# Patient Record
Sex: Female | Born: 1945
Health system: Southern US, Community
[De-identification: ages and names within clinical notes are randomized; demographics above are authoritative.]

## PROBLEM LIST (undated history)

## (undated) DIAGNOSIS — I10 Essential (primary) hypertension: Secondary | ICD-10-CM

## (undated) DIAGNOSIS — D649 Anemia, unspecified: Secondary | ICD-10-CM

## (undated) DIAGNOSIS — T7840XA Allergy, unspecified, initial encounter: Secondary | ICD-10-CM

## (undated) DIAGNOSIS — E785 Hyperlipidemia, unspecified: Secondary | ICD-10-CM

## (undated) DIAGNOSIS — E119 Type 2 diabetes mellitus without complications: Secondary | ICD-10-CM

## (undated) DIAGNOSIS — E039 Hypothyroidism, unspecified: Secondary | ICD-10-CM

## (undated) DIAGNOSIS — M549 Dorsalgia, unspecified: Secondary | ICD-10-CM

## (undated) DIAGNOSIS — M1990 Unspecified osteoarthritis, unspecified site: Secondary | ICD-10-CM

## (undated) DIAGNOSIS — K219 Gastro-esophageal reflux disease without esophagitis: Secondary | ICD-10-CM

## (undated) DIAGNOSIS — F329 Major depressive disorder, single episode, unspecified: Secondary | ICD-10-CM

## (undated) DIAGNOSIS — F32A Depression, unspecified: Secondary | ICD-10-CM

## (undated) DIAGNOSIS — E079 Disorder of thyroid, unspecified: Secondary | ICD-10-CM

## (undated) HISTORY — DX: Depression, unspecified: F32.A

## (undated) HISTORY — DX: Type 2 diabetes mellitus without complications: E11.9

## (undated) HISTORY — DX: Hyperlipidemia, unspecified: E78.5

## (undated) HISTORY — DX: Allergy, unspecified, initial encounter: T78.40XA

## (undated) HISTORY — DX: Essential (primary) hypertension: I10

## (undated) HISTORY — DX: Dorsalgia, unspecified: M54.9

## (undated) HISTORY — PX: BACK SURGERY: SHX140

## (undated) HISTORY — PX: REPLACEMENT TOTAL KNEE: SUR1224

## (undated) HISTORY — DX: Disorder of thyroid, unspecified: E07.9

---
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
## 1977-08-17 HISTORY — PX: CHOLECYSTECTOMY: SHX55

## 2002-08-17 HISTORY — PX: SPINE SURGERY: SHX786

## 2015-08-18 HISTORY — PX: JOINT REPLACEMENT: SHX530

## 2018-05-20 LAB — BASIC METABOLIC PANEL
BUN: 19 (ref 4–21)
Creatinine: 0.7 (ref 0.5–1.1)
Potassium: 5.2 (ref 3.4–5.3)
Sodium: 141 (ref 137–147)

## 2018-05-20 LAB — LIPID PANEL
Cholesterol: 124 (ref 0–200)
HDL: 35 (ref 35–70)
LDL Cholesterol: 64
Triglycerides: 176 — AB (ref 40–160)

## 2018-05-20 LAB — HEPATIC FUNCTION PANEL
ALT: 16 (ref 7–35)
AST: 16 (ref 13–35)

## 2018-05-20 LAB — CBC AND DIFFERENTIAL
HCT: 42 (ref 36–46)
Hemoglobin: 13.2 (ref 12.0–16.0)
Platelets: 280 (ref 150–399)

## 2018-08-18 LAB — MICROALBUMIN, URINE: Microalb, Ur: 70

## 2018-08-18 LAB — HEMOGLOBIN A1C: Hemoglobin A1C: 7.5

## 2019-03-18 LAB — COLOGUARD: Cologuard: NEGATIVE

## 2019-06-02 ENCOUNTER — Other Ambulatory Visit: Payer: Self-pay

## 2019-06-02 DIAGNOSIS — Z20822 Contact with and (suspected) exposure to covid-19: Secondary | ICD-10-CM

## 2019-06-04 LAB — NOVEL CORONAVIRUS, NAA: SARS-CoV-2, NAA: NOT DETECTED

## 2019-06-10 ENCOUNTER — Telehealth: Payer: Self-pay

## 2019-06-10 NOTE — Telephone Encounter (Signed)
Pt called to update SSN in chart for MyChart enrollment.

## 2019-06-27 ENCOUNTER — Ambulatory Visit (INDEPENDENT_AMBULATORY_CARE_PROVIDER_SITE_OTHER): Payer: Medicare PPO | Admitting: Family Medicine

## 2019-06-27 ENCOUNTER — Encounter: Payer: Self-pay | Admitting: Family Medicine

## 2019-06-27 ENCOUNTER — Other Ambulatory Visit: Payer: Self-pay

## 2019-06-27 VITALS — BP 120/72 | HR 80 | Ht 59.0 in | Wt 186.0 lb

## 2019-06-27 DIAGNOSIS — Z23 Encounter for immunization: Secondary | ICD-10-CM

## 2019-06-27 DIAGNOSIS — M545 Low back pain, unspecified: Secondary | ICD-10-CM

## 2019-06-27 DIAGNOSIS — E78019 Familial hypercholesterolemia, unspecified: Secondary | ICD-10-CM

## 2019-06-27 DIAGNOSIS — E1140 Type 2 diabetes mellitus with diabetic neuropathy, unspecified: Secondary | ICD-10-CM | POA: Diagnosis not present

## 2019-06-27 DIAGNOSIS — G8929 Other chronic pain: Secondary | ICD-10-CM

## 2019-06-27 DIAGNOSIS — I10 Essential (primary) hypertension: Secondary | ICD-10-CM

## 2019-06-27 DIAGNOSIS — E7801 Familial hypercholesterolemia: Secondary | ICD-10-CM | POA: Diagnosis not present

## 2019-06-27 DIAGNOSIS — L309 Dermatitis, unspecified: Secondary | ICD-10-CM

## 2019-06-27 DIAGNOSIS — E034 Atrophy of thyroid (acquired): Secondary | ICD-10-CM

## 2019-06-27 DIAGNOSIS — Z7689 Persons encountering health services in other specified circumstances: Secondary | ICD-10-CM

## 2019-06-27 MED ORDER — LEVOTHYROXINE SODIUM 88 MCG PO TABS: 88.0000 ug | ORAL_TABLET | Freq: Every day | ORAL | 1 refills | Status: DC

## 2019-06-27 MED ORDER — TRIAMCINOLONE ACETONIDE 0.1 % EX CREA: 1.0000 "application " | TOPICAL_CREAM | Freq: Two times a day (BID) | CUTANEOUS | 0 refills | Status: DC

## 2019-06-27 NOTE — Progress Notes (Signed)
Date:  06/27/2019   Name:  Sara Carr   DOB:  24-Sep-1945   MRN:  [id number]   Chief Complaint: Establish Care, ref neuro (back pain), Rash (hands itching), Flu Vaccine, and pneu vacc need (13)  Patient is a 73 year old female who presents for a establishment of care exam. The patient reports the following problems: multiple medical concern. Health maintenance has been reviewed up to date.  Rash This is a chronic problem. The current episode started more than 1 year ago. The problem has been waxing and waning since onset. The affected locations include the left hand and right hand. The rash is characterized by redness and itchiness. Pertinent negatives include no anorexia, congestion, cough, diarrhea, eye pain, facial edema, fatigue, fever, joint pain, nail changes, rhinorrhea, shortness of breath, sore throat or vomiting.  Diabetes She has type 2 diabetes mellitus. Her disease course has been fluctuating. There are no hypoglycemic associated symptoms. Pertinent negatives for hypoglycemia include no dizziness, headaches, nervousness/anxiousness or sweats. Pertinent negatives for diabetes include no blurred vision, no chest pain, no fatigue, no foot paresthesias, no foot ulcerations, no polydipsia, no polyphagia, no polyuria, no visual change, no weakness and no weight loss. There are no hypoglycemic complications. Symptoms are worsening. There are no diabetic complications. Pertinent negatives for diabetic complications include no CVA, peripheral neuropathy, PVD or retinopathy. Current diabetic treatment includes oral agent (dual therapy) (ozempic).  Hypertension This is a chronic problem. The current episode started more than 1 year ago. The problem is controlled. Pertinent negatives include no anxiety, blurred vision, chest pain, headaches, malaise/fatigue, neck pain, orthopnea, palpitations, peripheral edema, PND, shortness of breath or sweats. Risk factors for coronary artery disease include  diabetes mellitus and dyslipidemia. The current treatment provides moderate improvement. There are no compliance problems.  There is no history of angina, kidney disease, CAD/MI, CVA, heart failure, left ventricular hypertrophy, PVD or retinopathy. Identifiable causes of hypertension include a thyroid problem. There is no history of chronic renal disease, a hypertension causing med or renovascular disease.  Hyperlipidemia This is a chronic problem. The current episode started more than 1 year ago. The problem is controlled. Recent lipid tests were reviewed and are normal. She has no history of chronic renal disease, diabetes, hypothyroidism, liver disease or obesity. Pertinent negatives include no chest pain, focal sensory loss, focal weakness, leg pain, myalgias or shortness of breath. Current antihyperlipidemic treatment includes statins. The current treatment provides moderate improvement of lipids. Risk factors for coronary artery disease include diabetes mellitus and hypertension.  Thyroid Problem Presents for follow-up visit. Patient reports no anxiety, constipation, depressed mood, diarrhea, fatigue, palpitations, visual change or weight loss. The symptoms have been stable. Her past medical history is significant for hyperlipidemia. There is no history of diabetes or heart failure.    Lab Results  Component Value Date   CREATININE 0.7 05/20/2018   BUN 19 05/20/2018   NA 141 05/20/2018   K 5.2 05/20/2018   Lab Results  Component Value Date   CHOL 124 05/20/2018   HDL 35 05/20/2018   LDLCALC 64 05/20/2018   TRIG 176 (A) 05/20/2018   Lab Results  Component Value Date   TSH 0.03 (A) 08/20/2018   Lab Results  Component Value Date   HGBA1C 7.5 08/18/2018     Review of Systems  Constitutional: Negative for chills, fatigue, fever, malaise/fatigue and weight loss.  HENT: Negative for congestion, drooling, ear discharge, ear pain, rhinorrhea and sore throat.   Eyes: Negative  for  blurred vision and pain.  Respiratory: Negative for cough, shortness of breath and wheezing.   Cardiovascular: Negative for chest pain, palpitations, orthopnea, leg swelling and PND.  Gastrointestinal: Negative for abdominal pain, anorexia, blood in stool, constipation, diarrhea, nausea and vomiting.  Endocrine: Negative for polydipsia, polyphagia and polyuria.  Genitourinary: Negative for dysuria, frequency, hematuria and urgency.  Musculoskeletal: Negative for back pain, joint pain, myalgias and neck pain.  Skin: Positive for rash. Negative for nail changes.  Allergic/Immunologic: Negative for environmental allergies.  Neurological: Negative for dizziness, focal weakness, weakness and headaches.  Hematological: Does not bruise/bleed easily.  Psychiatric/Behavioral: Negative for suicidal ideas. The patient is not nervous/anxious.     There are no active problems to display for this patient.   Allergies  Allergen Reactions  . Penicillins Hives    Past Surgical History:  Procedure Laterality Date  . BACK SURGERY    . REPLACEMENT TOTAL KNEE Bilateral     Social History   Tobacco Use  . Smoking status: Never Smoker  . Smokeless tobacco: Never Used  Substance Use Topics  . Alcohol use: Never    Frequency: Never  . Drug use: Never     Medication list has been reviewed and updated.  Current Meds  Medication Sig  . atorvastatin (LIPITOR) 40 MG tablet Take 40 mg by mouth daily.  . carvedilol (COREG) 6.25 MG tablet Take 6.25 mg by mouth daily.  . Dapagliflozin-metFORMIN HCl ER (XIGDUO XR) 05-999 MG TB24 Take 1 tablet by mouth daily.  . diclofenac (VOLTAREN) 50 MG EC tablet Take 50 mg by mouth daily.  Marland Kitchen gabapentin (NEURONTIN) 800 MG tablet Take 800 mg by mouth 2 (two) times daily. As needed/ neuro  . lansoprazole (PREVACID) 30 MG capsule Take 30 mg by mouth daily at 12 noon.  Marland Kitchen levothyroxine (SYNTHROID) 100 MCG tablet Take 100 mcg by mouth daily before breakfast.  . losartan  (COZAAR) 25 MG tablet Take 25 mg by mouth daily.  . Semaglutide (OZEMPIC, 0.25 OR 0.5 MG/DOSE, [place]) Inject into the skin once a week. On Fridays  . sertraline (ZOLOFT) 50 MG tablet Take 50 mg by mouth daily.  Marland Kitchen tolterodine (DETROL LA) 4 MG 24 hr capsule Take 4 mg by mouth daily.    PHQ 2/9 Scores 06/27/2019  PHQ - 2 Score 4  PHQ- 9 Score 9    BP Readings from Last 3 Encounters:  06/27/19 120/72    Physical Exam Vitals signs and nursing note reviewed.  Constitutional:      Appearance: She is well-developed.  HENT:     Head: Normocephalic.     Right Ear: Tympanic membrane and external ear normal.     Left Ear: Tympanic membrane and external ear normal.     Nose: Nose normal. No congestion or rhinorrhea.  Eyes:     General: Lids are everted, no foreign bodies appreciated. No scleral icterus.       Left eye: No foreign body or hordeolum.     Conjunctiva/sclera: Conjunctivae normal.     Right eye: Right conjunctiva is not injected.     Left eye: Left conjunctiva is not injected.     Pupils: Pupils are equal, round, and reactive to light.  Neck:     Musculoskeletal: Normal range of motion and neck supple.     Thyroid: No thyromegaly.     Vascular: No carotid bruit or JVD.     Trachea: No tracheal deviation.  Cardiovascular:     Rate and  Rhythm: Normal rate and regular rhythm.     Heart sounds: Normal heart sounds. No murmur. No friction rub. No gallop.   Pulmonary:     Effort: Pulmonary effort is normal. No respiratory distress.     Breath sounds: Normal breath sounds. No wheezing, rhonchi or rales.  Abdominal:     General: Bowel sounds are normal.     Palpations: Abdomen is soft. There is no mass.     Tenderness: There is no abdominal tenderness. There is no guarding or rebound.  Musculoskeletal: Normal range of motion.        General: No tenderness.  Lymphadenopathy:     Cervical: No cervical adenopathy.  Skin:    General: Skin is warm.     Findings: No rash.   Neurological:     Mental Status: She is alert and oriented to person, place, and time.     Cranial Nerves: No cranial nerve deficit.     Deep Tendon Reflexes: Reflexes normal.  Psychiatric:        Mood and Affect: Mood is not anxious or depressed.     Wt Readings from Last 3 Encounters:  06/27/19 186 lb (84.4 kg)    BP 120/72   Pulse 80   Ht 4\' 11"  (1.499 m)   Wt 186 lb (84.4 kg)   BMI 37.57 kg/m   Assessment and Plan:  1. Establishing care with new doctor, encounter for Patient to establish care with new physician.  Previous encounters, labs, as available were reviewed.  New medications were added and reviewed.  2. Type 2 diabetes mellitus with diabetic neuropathy, without long-term current use of insulin (HCC) Chronic.  Controlled.  Patient is on a combination of Ozempic weekly and Xigduo XR 05-999 every 24 hours.  Will refer to endocrinology for management. - Ambulatory referral to Endocrinology  3. Essential hypertension Chronic.  Controlled.  Continue Coreg 6.25, Cozaar 25 mg.  4. Familial hypercholesterolemia Chronic.  Controlled.  Continue atorvastatin 40 mg once a day.  5. Hypothyroidism due to acquired atrophy of thyroid Upon review of previous labs it was noted that TSH was in a very low range the past 2 times.  We will go ahead and adjust Synthroid to 88 mcg.  And refer to endocrinology for further evaluation and treatment. - Ambulatory referral to Endocrinology  6. Chronic low back pain without sciatica, unspecified back pain laterality Patient with history of chronic back pain.  Patient presently is on gabapentin 800 mg up to twice a day.  7. Influenza vaccine needed Discussed and administered - Flu Vaccine QUAD High Dose(Fluad)  8. Need for pneumococcal vaccination Discussed and administered - Pneumococcal conjugate vaccine 13-valent  9. Eczema of both hands New onset both hands itching with erythema.  Suggested taking an antihistamine like Claritin  or Zyrtec during the day and a Benadryl at night.  Patient was also called in triamcinolone cream 0.1% to apply as needed - triamcinolone cream (KENALOG) 0.1 %; Apply 1 application topically 2 (two) times daily.  Dispense: 30 g; Refill: 0

## 2019-08-29 ENCOUNTER — Ambulatory Visit: Payer: Medicare Other | Attending: Internal Medicine

## 2019-08-29 DIAGNOSIS — Z23 Encounter for immunization: Secondary | ICD-10-CM

## 2019-08-29 NOTE — Progress Notes (Signed)
   Covid-19 Vaccination Clinic  Name:  Sara Carr    MRN: [id number] DOB: [birth year]  08/29/2019  [name] was observed post Covid-19 immunization for 15 minutes without incidence. She was provided with Vaccine Information Sheet and instruction to access the V-Safe system.   [name] was instructed to call 911 with any severe reactions post vaccine: Marland Kitchen Difficulty breathing  . Swelling of your face and throat  . A fast heartbeat  . A bad rash all over your body  . Dizziness and weakness    Immunizations Administered    Name Date Dose VIS Date Route   Pfizer COVID-19 Vaccine 08/29/2019 11:25 AM 0.3 mL 07/28/2019 Intramuscular   Manufacturer: [employer], [employer]   Lot: [id number]   NDC: [address]-7

## 2019-09-11 ENCOUNTER — Other Ambulatory Visit: Payer: Self-pay

## 2019-09-11 MED ORDER — LOSARTAN POTASSIUM 25 MG PO TABS: 25.0000 mg | ORAL_TABLET | Freq: Every day | ORAL | 0 refills | Status: DC

## 2019-09-11 MED ORDER — LEVOTHYROXINE SODIUM 88 MCG PO TABS: 88.0000 ug | ORAL_TABLET | Freq: Every day | ORAL | 0 refills | Status: DC

## 2019-09-11 MED ORDER — TOLTERODINE TARTRATE ER 4 MG PO CP24: 4.0000 mg | ORAL_CAPSULE | Freq: Every day | ORAL | 0 refills | Status: DC

## 2019-09-11 MED ORDER — ATORVASTATIN CALCIUM 40 MG PO TABS: 40.0000 mg | ORAL_TABLET | Freq: Every day | ORAL | 0 refills | Status: DC

## 2019-09-11 MED ORDER — LANSOPRAZOLE 30 MG PO CPDR: 30.0000 mg | DELAYED_RELEASE_CAPSULE | Freq: Every day | ORAL | 0 refills | Status: DC

## 2019-09-11 MED ORDER — SERTRALINE HCL 50 MG PO TABS: 50.0000 mg | ORAL_TABLET | Freq: Every day | ORAL | 0 refills | Status: DC

## 2019-09-11 MED ORDER — DICLOFENAC SODIUM 50 MG PO TBEC: 50.0000 mg | DELAYED_RELEASE_TABLET | Freq: Every day | ORAL | 0 refills | Status: DC

## 2019-09-16 ENCOUNTER — Ambulatory Visit: Payer: Medicare PPO

## 2019-09-16 ENCOUNTER — Ambulatory Visit: Payer: Medicare PPO | Attending: Internal Medicine

## 2019-09-16 DIAGNOSIS — Z23 Encounter for immunization: Secondary | ICD-10-CM | POA: Insufficient documentation

## 2019-09-16 NOTE — Progress Notes (Signed)
   Covid-19 Vaccination Clinic  Name:  Sara Carr    MRN: [id number] DOB: Aug 22, 1945  09/16/2019  [name] was observed post Covid-19 immunization for 15 minutes without incidence. She was provided with Vaccine Information Sheet and instruction to access the V-Safe system.   [name] was instructed to call 911 with any severe reactions post vaccine: Marland Kitchen Difficulty breathing  . Swelling of your face and throat  . A fast heartbeat  . A bad rash all over your body  . Dizziness and weakness    Immunizations Administered    Name Date Dose VIS Date Route   Pfizer COVID-19 Vaccine 09/16/2019  2:01 PM 0.3 mL 07/28/2019 Intramuscular   Manufacturer: [employer], [employer]   Lot: [id number]   NDC: [address]-7

## 2019-10-26 ENCOUNTER — Telehealth: Payer: Self-pay

## 2019-10-26 NOTE — Telephone Encounter (Signed)
Pt called in stating that something needed to be done about her medicine. It is "400.00 and I can't afford that." I called pt back and explained to her that Otelia Sergeant had seen her for her diab as well as her thyroid in Dec. I gave her the telephone number to that office and told her to tell them she is having a hard time with ins paying for this med. To see if they prescribe it, will insurance pay. We don't know of anything else that can be prescribed, as this should be a generic

## 2019-11-03 DIAGNOSIS — E063 Autoimmune thyroiditis: Secondary | ICD-10-CM | POA: Diagnosis not present

## 2019-11-03 DIAGNOSIS — E782 Mixed hyperlipidemia: Secondary | ICD-10-CM | POA: Diagnosis not present

## 2019-11-03 DIAGNOSIS — E669 Obesity, unspecified: Secondary | ICD-10-CM | POA: Diagnosis not present

## 2019-11-03 DIAGNOSIS — E1169 Type 2 diabetes mellitus with other specified complication: Secondary | ICD-10-CM | POA: Diagnosis not present

## 2019-11-03 DIAGNOSIS — I10 Essential (primary) hypertension: Secondary | ICD-10-CM | POA: Diagnosis not present

## 2019-11-03 LAB — HEMOGLOBIN A1C: Hemoglobin A1C: 7.8

## 2019-11-06 ENCOUNTER — Other Ambulatory Visit: Payer: Self-pay

## 2019-11-06 NOTE — Progress Notes (Unsigned)
Put in A1C 

## 2019-11-07 DIAGNOSIS — E1142 Type 2 diabetes mellitus with diabetic polyneuropathy: Secondary | ICD-10-CM | POA: Diagnosis not present

## 2019-11-07 DIAGNOSIS — B351 Tinea unguium: Secondary | ICD-10-CM | POA: Diagnosis not present

## 2019-11-14 ENCOUNTER — Other Ambulatory Visit: Payer: Self-pay

## 2019-11-14 DIAGNOSIS — E034 Atrophy of thyroid (acquired): Secondary | ICD-10-CM

## 2019-11-14 MED ORDER — LEVOTHYROXINE SODIUM 75 MCG PO TABS: 75.0000 ug | ORAL_TABLET | Freq: Every day | ORAL | 0 refills | Status: DC

## 2019-11-21 ENCOUNTER — Other Ambulatory Visit: Payer: Self-pay

## 2019-11-21 MED ORDER — TOLTERODINE TARTRATE ER 4 MG PO CP24: 4.0000 mg | ORAL_CAPSULE | Freq: Every day | ORAL | 0 refills | Status: DC

## 2019-11-21 NOTE — Progress Notes (Unsigned)
Sent in tolterodine to [facility]

## 2019-11-23 ENCOUNTER — Other Ambulatory Visit: Payer: Self-pay

## 2019-11-23 ENCOUNTER — Encounter: Payer: Self-pay | Admitting: Family Medicine

## 2019-11-23 ENCOUNTER — Ambulatory Visit: Payer: Medicare PPO | Admitting: Family Medicine

## 2019-11-23 VITALS — BP 120/54 | HR 66 | Temp 98.5°F | Ht 59.0 in | Wt 192.0 lb

## 2019-11-23 DIAGNOSIS — R350 Frequency of micturition: Secondary | ICD-10-CM

## 2019-11-23 DIAGNOSIS — R358 Other polyuria: Secondary | ICD-10-CM | POA: Diagnosis not present

## 2019-11-23 DIAGNOSIS — E1140 Type 2 diabetes mellitus with diabetic neuropathy, unspecified: Secondary | ICD-10-CM | POA: Diagnosis not present

## 2019-11-23 LAB — POCT URINALYSIS DIPSTICK
Bilirubin, UA: NEGATIVE
Blood, UA: NEGATIVE
Glucose, UA: POSITIVE — AB
Ketones, UA: NEGATIVE
Leukocytes, UA: NEGATIVE
Nitrite, UA: NEGATIVE
Protein, UA: NEGATIVE
Spec Grav, UA: 1.02 (ref 1.010–1.025)
Urobilinogen, UA: 0.2 E.U./dL
pH, UA: 6 (ref 5.0–8.0)

## 2019-11-23 NOTE — Progress Notes (Signed)
Date:  11/23/2019   Name:  Sara Carr   DOB:  [birth year]   MRN:  [id number]   Chief Complaint: Urinary Tract Infection (x1 week,loss of control urine, burning, itching, and pressure)  Urinary Frequency  This is a chronic problem. The current episode started more than 1 year ago. The problem has been gradually worsening. The patient is experiencing no pain. There has been no fever. Associated symptoms include frequency. Pertinent negatives include no chills, discharge, flank pain, hematuria, hesitancy, nausea, sweats, urgency or vomiting. She has tried nothing for the symptoms. The treatment provided mild relief. There is no history of catheterization, kidney stones, recurrent UTIs, a single kidney, urinary stasis or a urological procedure.    Lab Results  Component Value Date   CREATININE 0.7 05/20/2018   BUN 19 05/20/2018   NA 141 05/20/2018   K 5.2 05/20/2018   Lab Results  Component Value Date   CHOL 124 05/20/2018   HDL 35 05/20/2018   LDLCALC 64 05/20/2018   TRIG 176 (A) 05/20/2018   No results found for: TSH Lab Results  Component Value Date   HGBA1C 7.8 11/03/2019   Lab Results  Component Value Date   HGB 13.2 05/20/2018   HCT 42 05/20/2018   PLT 280 05/20/2018   Lab Results  Component Value Date   ALT 16 05/20/2018   AST 16 05/20/2018     Review of Systems  Constitutional: Negative.  Negative for chills, fatigue, fever and unexpected weight change.  HENT: Negative for congestion, ear discharge, ear pain, rhinorrhea, sinus pressure, sneezing and sore throat.   Eyes: Negative for photophobia, pain, discharge, redness and itching.  Respiratory: Negative for cough, shortness of breath, wheezing and stridor.   Cardiovascular: Negative for palpitations and leg swelling.  Gastrointestinal: Negative for abdominal pain, blood in stool, constipation, diarrhea, nausea and vomiting.  Endocrine: Negative for cold intolerance, heat intolerance, polydipsia, polyphagia  and polyuria.  Genitourinary: Positive for frequency. Negative for dysuria, flank pain, hematuria, hesitancy, menstrual problem, pelvic pain, urgency, vaginal bleeding and vaginal discharge.  Musculoskeletal: Negative for arthralgias, back pain and myalgias.  Skin: Negative for rash.  Allergic/Immunologic: Negative for environmental allergies and food allergies.  Neurological: Negative for dizziness, weakness, light-headedness, numbness and headaches.  Hematological: Negative for adenopathy. Does not bruise/bleed easily.  Psychiatric/Behavioral: Negative for dysphoric mood. The patient is not nervous/anxious.     There are no problems to display for this patient.   Allergies  Allergen Reactions  . Penicillins Hives    Past Surgical History:  Procedure Laterality Date  . BACK SURGERY    . REPLACEMENT TOTAL KNEE Bilateral     Social History   Tobacco Use  . Smoking status: Never Smoker  . Smokeless tobacco: Never Used  Substance Use Topics  . Alcohol use: Never  . Drug use: Never     Medication list has been reviewed and updated.  Current Meds  Medication Sig  . atorvastatin (LIPITOR) 40 MG tablet Take 1 tablet (40 mg total) by mouth daily.  . carvedilol (COREG) 6.25 MG tablet Take 6.25 mg by mouth daily.  . diclofenac (VOLTAREN) 50 MG EC tablet Take 1 tablet (50 mg total) by mouth daily.  Marland Kitchen gabapentin (NEURONTIN) 800 MG tablet Take 800 mg by mouth 2 (two) times daily. As needed/ neuro  . lansoprazole (PREVACID) 30 MG capsule Take 1 capsule (30 mg total) by mouth daily at 12 noon.  Marland Kitchen levothyroxine (SYNTHROID) 75 MCG tablet Take 1 tablet (  75 mcg total) by mouth daily.  Marland Kitchen losartan (COZAAR) 25 MG tablet Take 1 tablet (25 mg total) by mouth daily.  . Semaglutide (OZEMPIC, 0.25 OR 0.5 MG/DOSE, [place]) Inject into the skin once a week. On Fridays  . sertraline (ZOLOFT) 50 MG tablet Take 1 tablet (50 mg total) by mouth daily.  Marland Kitchen tolterodine (DETROL LA) 4 MG 24 hr capsule Take 1  capsule (4 mg total) by mouth daily.  Marland Kitchen triamcinolone cream (KENALOG) 0.1 % Apply 1 application topically 2 (two) times daily.    PHQ 2/9 Scores 11/23/2019 06/27/2019  PHQ - 2 Score 6 4  PHQ- 9 Score 9 9    BP Readings from Last 3 Encounters:  11/23/19 (!) 120/54  06/27/19 120/72    Physical Exam Vitals and nursing note reviewed.  Constitutional:      General: She is not in acute distress.    Appearance: She is not diaphoretic.  HENT:     Head: Normocephalic and atraumatic.     Right Ear: External ear normal.     Left Ear: External ear normal.     Nose: Nose normal.  Eyes:     General:        Right eye: No discharge.        Left eye: No discharge.     Conjunctiva/sclera: Conjunctivae normal.     Pupils: Pupils are equal, round, and reactive to light.  Neck:     Thyroid: No thyromegaly.     Vascular: No JVD.  Cardiovascular:     Rate and Rhythm: Normal rate and regular rhythm.     Heart sounds: Normal heart sounds. No murmur. No friction rub. No gallop.   Pulmonary:     Effort: Pulmonary effort is normal.     Breath sounds: Normal breath sounds.  Abdominal:     General: Bowel sounds are normal.     Palpations: Abdomen is soft. There is no mass.     Tenderness: There is no abdominal tenderness. There is no guarding.  Musculoskeletal:        General: Normal range of motion.     Cervical back: Normal range of motion and neck supple.  Lymphadenopathy:     Cervical: No cervical adenopathy.  Skin:    General: Skin is warm and dry.  Neurological:     Mental Status: She is alert.     Deep Tendon Reflexes: Reflexes are normal and symmetric.     Wt Readings from Last 3 Encounters:  11/23/19 192 lb (87.1 kg)  06/27/19 186 lb (84.4 kg)    BP (!) 120/54   Pulse 66   Temp 98.5 F (36.9 C) (Oral)   Ht 4\' 11"  (1.499 m)   Wt 192 lb (87.1 kg)   BMI 38.78 kg/m   Assessment and Plan: 1. Urinary frequency Acute.  Paroxysmal.  Excessive and uncontrolled.  Patient is  having urinary incontinence due to excessive amounts of urine.  I do not think is the medication because upon checking her point-of-care blood sugar it was over 400.  I suspect is due to the polyuria. - POCT Urinalysis Dipstick  2. Frequency of urination and polyuria Chronic.  Uncontrolled.  Stable.  Patient has elevated blood glucose secondary intrinsic rather than overeating or medications.  There may be some contribution of the sick duo however that would not explain her 400 blood sugar and the excessive amount of urination.  Will refer to [facility] for evaluation and possible adjustment of medication.  3. Type 2 diabetes mellitus with diabetic neuropathy, without long-term current use of insulin (HCC) Chronic.  Uncontrolled.  Patient currently on Xigduo XR and Ozempic.  Patient tolerating well but results may not be at best.  She will be taking her readings from her cutaneous blood monitoring device with her tomorrow to [facility] for evaluation.  Patient had questions about insulin that we discussed long-acting and short acting and the possibility of needing to be on this level of control.

## 2019-11-24 DIAGNOSIS — E669 Obesity, unspecified: Secondary | ICD-10-CM | POA: Diagnosis not present

## 2019-11-24 DIAGNOSIS — I10 Essential (primary) hypertension: Secondary | ICD-10-CM | POA: Diagnosis not present

## 2019-11-24 DIAGNOSIS — E782 Mixed hyperlipidemia: Secondary | ICD-10-CM | POA: Diagnosis not present

## 2019-11-24 DIAGNOSIS — E1165 Type 2 diabetes mellitus with hyperglycemia: Secondary | ICD-10-CM | POA: Diagnosis not present

## 2019-11-24 DIAGNOSIS — E063 Autoimmune thyroiditis: Secondary | ICD-10-CM | POA: Diagnosis not present

## 2019-11-28 ENCOUNTER — Other Ambulatory Visit: Payer: Self-pay

## 2019-11-28 ENCOUNTER — Ambulatory Visit: Payer: Medicare PPO | Admitting: Family Medicine

## 2019-11-28 ENCOUNTER — Encounter: Payer: Self-pay | Admitting: Family Medicine

## 2019-11-28 VITALS — BP 120/70 | HR 88 | Ht 59.0 in | Wt 185.0 lb

## 2019-11-28 DIAGNOSIS — M1990 Unspecified osteoarthritis, unspecified site: Secondary | ICD-10-CM | POA: Diagnosis not present

## 2019-11-28 DIAGNOSIS — K219 Gastro-esophageal reflux disease without esophagitis: Secondary | ICD-10-CM | POA: Diagnosis not present

## 2019-11-28 DIAGNOSIS — N3941 Urge incontinence: Secondary | ICD-10-CM | POA: Diagnosis not present

## 2019-11-28 DIAGNOSIS — E034 Atrophy of thyroid (acquired): Secondary | ICD-10-CM | POA: Diagnosis not present

## 2019-11-28 DIAGNOSIS — E7801 Familial hypercholesterolemia: Secondary | ICD-10-CM | POA: Diagnosis not present

## 2019-11-28 DIAGNOSIS — F324 Major depressive disorder, single episode, in partial remission: Secondary | ICD-10-CM

## 2019-11-28 DIAGNOSIS — I10 Essential (primary) hypertension: Secondary | ICD-10-CM

## 2019-11-28 DIAGNOSIS — E875 Hyperkalemia: Secondary | ICD-10-CM | POA: Diagnosis not present

## 2019-11-28 MED ORDER — LEVOTHYROXINE SODIUM 75 MCG PO TABS: 75.0000 ug | ORAL_TABLET | Freq: Every day | ORAL | 1 refills | Status: DC

## 2019-11-28 MED ORDER — CARVEDILOL 6.25 MG PO TABS: 6.2500 mg | ORAL_TABLET | Freq: Every day | ORAL | 1 refills | Status: DC

## 2019-11-28 MED ORDER — LEVOTHYROXINE SODIUM 50 MCG PO TABS: 50.0000 ug | ORAL_TABLET | Freq: Every day | ORAL | 1 refills | Status: DC

## 2019-11-28 MED ORDER — LOSARTAN POTASSIUM 25 MG PO TABS: 25.0000 mg | ORAL_TABLET | Freq: Every day | ORAL | 1 refills | Status: DC

## 2019-11-28 MED ORDER — ATORVASTATIN CALCIUM 40 MG PO TABS: 40.0000 mg | ORAL_TABLET | Freq: Every day | ORAL | 1 refills | Status: DC

## 2019-11-28 MED ORDER — DICLOFENAC SODIUM 50 MG PO TBEC: 50.0000 mg | DELAYED_RELEASE_TABLET | Freq: Every day | ORAL | 1 refills | Status: DC

## 2019-11-28 MED ORDER — LANSOPRAZOLE 30 MG PO CPDR: 30.0000 mg | DELAYED_RELEASE_CAPSULE | Freq: Every day | ORAL | 1 refills | Status: DC

## 2019-11-28 MED ORDER — TOLTERODINE TARTRATE ER 4 MG PO CP24: 4.0000 mg | ORAL_CAPSULE | Freq: Every day | ORAL | 1 refills | Status: DC

## 2019-11-28 MED ORDER — SERTRALINE HCL 50 MG PO TABS: 50.0000 mg | ORAL_TABLET | Freq: Every day | ORAL | 1 refills | Status: DC

## 2019-11-28 NOTE — Progress Notes (Signed)
Date:  11/28/2019   Name:  Sara Carr   DOB:  [birth year]   MRN:  [id number]   Chief Complaint: Hypertension, Hyperlipidemia, Knee Pain, Hypothyroidism, Depression, overactive bladder, and Gastroesophageal Reflux  Hypertension This is a chronic problem. The current episode started more than 1 year ago. The problem has been gradually improving since onset. The problem is controlled. Associated symptoms include headaches. Pertinent negatives include no anxiety, blurred vision, chest pain, malaise/fatigue, neck pain, orthopnea, palpitations, peripheral edema, PND, shortness of breath or sweats. There are no associated agents to hypertension. There are no known risk factors for coronary artery disease. Past treatments include beta blockers, alpha 1 blockers and angiotensin blockers. The current treatment provides moderate improvement. There are no compliance problems.  There is no history of angina, kidney disease, CAD/MI, CVA, heart failure, left ventricular hypertrophy, PVD or retinopathy. Identifiable causes of hypertension include a thyroid problem. There is no history of chronic renal disease, a hypertension causing med or renovascular disease.  Hyperlipidemia This is a chronic problem. The current episode started more than 1 year ago. Exacerbating diseases include diabetes and hypothyroidism. She has no history of chronic renal disease, liver disease or obesity. There are no known factors aggravating her hyperlipidemia. Pertinent negatives include no chest pain, focal sensory loss, focal weakness, leg pain, myalgias or shortness of breath. Current antihyperlipidemic treatment includes statins. The current treatment provides moderate improvement of lipids. Risk factors for coronary artery disease include dyslipidemia, diabetes mellitus and hypertension.  Knee Pain  Incident onset: chronic. The quality of the pain is described as aching. The pain has been fluctuating since onset. Pertinent negatives  include no numbness.  Depression        This is a chronic problem.  The current episode started more than 1 year ago.   The onset quality is gradual.   The problem occurs intermittently.  The problem has been gradually improving since onset.  Associated symptoms include helplessness, hopelessness, insomnia, headaches and sad.  Associated symptoms include no decreased concentration, no fatigue, not irritable, no restlessness, no decreased interest, no appetite change, no body aches, no myalgias, no indigestion and no suicidal ideas.  Past treatments include SSRIs - Selective serotonin reuptake inhibitors.  Past medical history includes hypothyroidism and thyroid problem.     Pertinent negatives include no anxiety. Gastroesophageal Reflux She reports no abdominal pain, no belching, no chest pain, no choking, no coughing, no dysphagia, no early satiety, no globus sensation, no heartburn, no hoarse voice, no nausea, no sore throat or no wheezing. The problem has been waxing and waning. The symptoms are aggravated by certain foods. Pertinent negatives include no fatigue.  Thyroid Problem Presents for follow-up visit. Patient reports no anxiety, cold intolerance, constipation, diarrhea, fatigue, heat intolerance, hoarse voice, menstrual problem or palpitations. The symptoms have been stable. Her past medical history is significant for diabetes and hyperlipidemia. There is no history of heart failure.    Lab Results  Component Value Date   CREATININE 0.7 05/20/2018   BUN 19 05/20/2018   NA 141 05/20/2018   K 5.2 05/20/2018   Lab Results  Component Value Date   CHOL 124 05/20/2018   HDL 35 05/20/2018   LDLCALC 64 05/20/2018   TRIG 176 (A) 05/20/2018   No results found for: TSH Lab Results  Component Value Date   HGBA1C 7.8 11/03/2019   Lab Results  Component Value Date   HGB 13.2 05/20/2018   HCT 42 05/20/2018   PLT 280 05/20/2018  Lab Results  Component Value Date   ALT 16 05/20/2018    AST 16 05/20/2018     Review of Systems  Constitutional: Negative.  Negative for appetite change, chills, fatigue, fever, malaise/fatigue and unexpected weight change.  HENT: Negative for congestion, ear discharge, ear pain, hoarse voice, rhinorrhea, sinus pressure, sneezing and sore throat.   Eyes: Negative for blurred vision, photophobia, pain, discharge, redness and itching.  Respiratory: Negative for cough, choking, shortness of breath, wheezing and stridor.   Cardiovascular: Negative for chest pain, palpitations, orthopnea and PND.  Gastrointestinal: Negative for abdominal pain, blood in stool, constipation, diarrhea, dysphagia, heartburn, nausea and vomiting.  Endocrine: Negative for cold intolerance, heat intolerance, polydipsia, polyphagia and polyuria.  Genitourinary: Negative for dysuria, flank pain, frequency, hematuria, menstrual problem, pelvic pain, urgency, vaginal bleeding and vaginal discharge.  Musculoskeletal: Negative for arthralgias, back pain, myalgias and neck pain.  Skin: Negative for rash.  Allergic/Immunologic: Negative for environmental allergies and food allergies.  Neurological: Positive for headaches. Negative for dizziness, focal weakness, weakness, light-headedness and numbness.  Hematological: Negative for adenopathy. Does not bruise/bleed easily.  Psychiatric/Behavioral: Positive for depression. Negative for decreased concentration, dysphoric mood and suicidal ideas. The patient has insomnia. The patient is not nervous/anxious.     There are no problems to display for this patient.   Allergies  Allergen Reactions  . Penicillins Hives    Past Surgical History:  Procedure Laterality Date  . BACK SURGERY    . REPLACEMENT TOTAL KNEE Bilateral     Social History   Tobacco Use  . Smoking status: Never Smoker  . Smokeless tobacco: Never Used  Substance Use Topics  . Alcohol use: Never  . Drug use: Never     Medication list has been reviewed and  updated.  Current Meds  Medication Sig  . atorvastatin (LIPITOR) 40 MG tablet Take 1 tablet (40 mg total) by mouth daily.  . carvedilol (COREG) 6.25 MG tablet Take 6.25 mg by mouth daily.  . Dapagliflozin-metFORMIN HCl ER (XIGDUO XR) 05-999 MG TB24 Take 1 tablet by mouth daily.  . diclofenac (VOLTAREN) 50 MG EC tablet Take 1 tablet (50 mg total) by mouth daily.  Marland Kitchen gabapentin (NEURONTIN) 800 MG tablet Take 800 mg by mouth 2 (two) times daily. As needed/ neuro  . lansoprazole (PREVACID) 30 MG capsule Take 1 capsule (30 mg total) by mouth daily at 12 noon.  Marland Kitchen levothyroxine (SYNTHROID) 75 MCG tablet Take 1 tablet (75 mcg total) by mouth daily.  Marland Kitchen losartan (COZAAR) 25 MG tablet Take 1 tablet (25 mg total) by mouth daily.  . Semaglutide (OZEMPIC, 0.25 OR 0.5 MG/DOSE, [place]) Inject into the skin once a week. On Fridays  . sertraline (ZOLOFT) 50 MG tablet Take 1 tablet (50 mg total) by mouth daily.  Marland Kitchen tolterodine (DETROL LA) 4 MG 24 hr capsule Take 1 capsule (4 mg total) by mouth daily.  Marland Kitchen triamcinolone cream (KENALOG) 0.1 % Apply 1 application topically 2 (two) times daily.    PHQ 2/9 Scores 11/28/2019 11/23/2019 06/27/2019  PHQ - 2 Score 6 6 4   PHQ- 9 Score 8 9 9     BP Readings from Last 3 Encounters:  11/28/19 120/70  11/23/19 (!) 120/54  06/27/19 120/72    Physical Exam Vitals and nursing note reviewed.  Constitutional:      General: She is not irritable.She is not in acute distress.    Appearance: She is not diaphoretic.  HENT:     Head: Normocephalic and atraumatic.  Right Ear: Tympanic membrane, ear canal and external ear normal.     Left Ear: Tympanic membrane, ear canal and external ear normal.     Nose: Nose normal.  Eyes:     General:        Right eye: No discharge.        Left eye: No discharge.     Conjunctiva/sclera: Conjunctivae normal.     Pupils: Pupils are equal, round, and reactive to light.  Neck:     Thyroid: No thyromegaly.     Vascular: No JVD.   Cardiovascular:     Rate and Rhythm: Normal rate and regular rhythm.     Pulses: Normal pulses.     Heart sounds: Normal heart sounds and S1 normal. No murmur. No systolic murmur. No diastolic murmur. No friction rub. No gallop. No S3 or S4 sounds.   Pulmonary:     Effort: Pulmonary effort is normal.     Breath sounds: Normal breath sounds.  Abdominal:     General: Bowel sounds are normal.     Palpations: Abdomen is soft. There is no mass.     Tenderness: There is no abdominal tenderness. There is no guarding.  Musculoskeletal:        General: Normal range of motion.     Cervical back: Normal range of motion and neck supple.     Right lower leg: No edema.     Left lower leg: No edema.  Lymphadenopathy:     Cervical: No cervical adenopathy.  Skin:    General: Skin is warm and dry.  Neurological:     Mental Status: She is alert.     Deep Tendon Reflexes: Reflexes are normal and symmetric.     Wt Readings from Last 3 Encounters:  11/28/19 185 lb (83.9 kg)  11/23/19 192 lb (87.1 kg)  06/27/19 186 lb (84.4 kg)    BP 120/70   Pulse 88   Ht 4\' 11"  (1.499 m)   Wt 185 lb (83.9 kg)   BMI 37.37 kg/m   Assessment and Plan: 1. Essential hypertension Chronic.  Controlled.  120/70.  Stable.  Continue losartan 25 mg once a day and carvedilol 6.25 twice a day.  We will recheck renal function panel because patient was noted to have an elevated potassium. - losartan (COZAAR) 25 MG tablet; Take 1 tablet (25 mg total) by mouth daily.  Dispense: 90 tablet; Refill: 1 - Renal Function Panel  2. Hypothyroidism due to acquired atrophy of thyroid Chronic.  Controlled.  Stable.  Past TSH that was done in endocrinology noted TSH was elevated as it was the previous.  Patient is feeling well but we will reduce to 50 mcg from 75 given the risk of osteoporosis.  3. Urge incontinence Chronic.  Controlled.  Stable.  Patient has had no problems but he is taking Detrol LA 4 mg daily for control -  tolterodine (DETROL LA) 4 MG 24 hr capsule; Take 1 capsule (4 mg total) by mouth daily.  Dispense: 90 capsule; Refill: 1  4. Familial hypercholesterolemia Chronic.  Controlled.  Stable.  Continue atorvastatin 40 mg once a day and repeat lipid panel from endocrine clinic. - atorvastatin (LIPITOR) 40 MG tablet; Take 1 tablet (40 mg total) by mouth daily.  Dispense: 90 tablet; Refill: 1  5. Major depressive disorder in partial remission, unspecified whether recurrent (HCC) Chronic.  Controlled.  Stable.  PHQ is 6.  Continue sertraline 50 mg daily. - sertraline (ZOLOFT) 50 MG tablet;  Take 1 tablet (50 mg total) by mouth daily.  Dispense: 90 tablet; Refill: 1  6. Arthritis Patient is currently taking diclofenac 50 mg by mouth twice a day.  Currently arthritis is stable with no significant pain - diclofenac (VOLTAREN) 50 MG EC tablet; Take 1 tablet (50 mg total) by mouth daily.  Dispense: 90 tablet; Refill: 1  7. Gastroesophageal reflux disease without esophagitis Chronic.  Controlled.  Stable.  Continue Prevacid 30 mg once a day. - lansoprazole (PREVACID) 30 MG capsule; Take 1 capsule (30 mg total) by mouth daily at 12 noon.  Dispense: 90 capsule; Refill: 1  8. Hyperkalemia Upon review of last CMP in endocrine it was noted that patient had a mildly elevated potassium.  This will be repeated.  Patient was noted to have normal creatinine and normal GFR however. - Renal Function Panel

## 2019-11-28 NOTE — Patient Instructions (Signed)

## 2019-11-29 ENCOUNTER — Emergency Department: Payer: Medicare PPO

## 2019-11-29 ENCOUNTER — Telehealth: Payer: Self-pay

## 2019-11-29 ENCOUNTER — Emergency Department
Admission: EM | Admit: 2019-11-29 | Discharge: 2019-11-29 | Disposition: A | Discharge status: Home / Self Care | Payer: Medicare PPO | Attending: Emergency Medicine | Admitting: Emergency Medicine

## 2019-11-29 ENCOUNTER — Other Ambulatory Visit: Payer: Self-pay

## 2019-11-29 ENCOUNTER — Encounter: Payer: Self-pay | Admitting: Emergency Medicine

## 2019-11-29 DIAGNOSIS — I10 Essential (primary) hypertension: Secondary | ICD-10-CM | POA: Insufficient documentation

## 2019-11-29 DIAGNOSIS — M545 Low back pain: Secondary | ICD-10-CM | POA: Insufficient documentation

## 2019-11-29 DIAGNOSIS — M47816 Spondylosis without myelopathy or radiculopathy, lumbar region: Secondary | ICD-10-CM | POA: Diagnosis not present

## 2019-11-29 DIAGNOSIS — M47812 Spondylosis without myelopathy or radiculopathy, cervical region: Secondary | ICD-10-CM | POA: Diagnosis not present

## 2019-11-29 DIAGNOSIS — Z79899 Other long term (current) drug therapy: Secondary | ICD-10-CM | POA: Insufficient documentation

## 2019-11-29 DIAGNOSIS — G8929 Other chronic pain: Secondary | ICD-10-CM

## 2019-11-29 DIAGNOSIS — Z7984 Long term (current) use of oral hypoglycemic drugs: Secondary | ICD-10-CM | POA: Diagnosis not present

## 2019-11-29 DIAGNOSIS — Z96653 Presence of artificial knee joint, bilateral: Secondary | ICD-10-CM | POA: Diagnosis not present

## 2019-11-29 DIAGNOSIS — E119 Type 2 diabetes mellitus without complications: Secondary | ICD-10-CM | POA: Insufficient documentation

## 2019-11-29 DIAGNOSIS — M48061 Spinal stenosis, lumbar region without neurogenic claudication: Secondary | ICD-10-CM | POA: Diagnosis not present

## 2019-11-29 LAB — RENAL FUNCTION PANEL
Albumin: 4.6 g/dL (ref 3.7–4.7)
BUN/Creatinine Ratio: 20 (ref 12–28)
BUN: 22 mg/dL (ref 8–27)
CO2: 23 mmol/L (ref 20–29)
Calcium: 9.9 mg/dL (ref 8.7–10.3)
Chloride: 100 mmol/L (ref 96–106)
Creatinine, Ser: 1.08 mg/dL — ABNORMAL HIGH (ref 0.57–1.00)
GFR calc Af Amer: 58 mL/min/{1.73_m2} — ABNORMAL LOW (ref >59)
GFR calc non Af Amer: 50 mL/min/{1.73_m2} — ABNORMAL LOW (ref >59)
Glucose: 210 mg/dL — ABNORMAL HIGH (ref 65–99)
Phosphorus: 3.1 mg/dL (ref 3.0–4.3)
Potassium: 4.8 mmol/L (ref 3.5–5.2)
Sodium: 139 mmol/L (ref 134–144)

## 2019-11-29 MED ORDER — OXYCODONE-ACETAMINOPHEN 5-325 MG PO TABS
1.0000 | ORAL_TABLET | Freq: Once | ORAL | Status: AC
  Administered 2019-11-29: 1 via ORAL
  Filled 2019-11-29: qty 1

## 2019-11-29 MED ORDER — OXYCODONE-ACETAMINOPHEN 5-325 MG PO TABS: 1.0000 | ORAL_TABLET | ORAL | 0 refills | Status: DC | PRN

## 2019-11-29 NOTE — Telephone Encounter (Signed)
Pt called in to say that she was laying in bed and something in her back doesn't feel right. She thinks that her "rod snapped". I have advised her to go to ER for further evaluation since she "can't walk". I gave her info for [facility] in [place] and [facility]

## 2019-11-29 NOTE — Discharge Instructions (Addendum)
Follow-up with [facility] clinic neurosurgery please call for an appointment Take all of your regular medications as prescribed Take Percocet for pain not controlled by your regular medications Apply ice to the lower back

## 2019-11-29 NOTE — ED Triage Notes (Addendum)
Pt here for mid back pain where had previous surgery.  Has a rod in place that was done in [place].  Always has some pain but last night got worse.  Pain relieved when leaning over bed.  Pain worse with movement. Denies loss bowel or bladder, numbness and tingling.

## 2019-11-29 NOTE — ED Provider Notes (Signed)
[facility] Emergency Department Provider Note  ____________________________________________   First MD Initiated Contact with Patient 11/29/19 1101     (approximate)  I have reviewed the triage vital signs and the nursing notes.   HISTORY  Chief Complaint Back Pain    HPI Sara Carr is a 74 y.o. female presents emergency department complaining of mid to lower back pain.  Patient states she had a rod placed in her back previously.  She states now she is having difficulty walking due to the pain.  States she was trying to make a bed last night and had to just lay down across the bed to help relieve the pain.  She states she had years where she felt good after the surgery but now the pain has started to increase like it did prior to her surgery.  No loss of bowel or bladder control.  Patient states she is walking slower but is able to ambulate.    Past Medical History:  Diagnosis Date  . Back pain   . Depression   . Diabetes mellitus without complication ([place])   . Hyperlipidemia   . Hypertension   . Thyroid disease     There are no problems to display for this patient.   Past Surgical History:  Procedure Laterality Date  . BACK SURGERY    . REPLACEMENT TOTAL KNEE Bilateral     Prior to Admission medications   Medication Sig Start Date End Date Taking? Authorizing Provider  atorvastatin (LIPITOR) 40 MG tablet Take 1 tablet (40 mg total) by mouth daily. 11/28/19   Juline Patch, MD  carvedilol (COREG) 6.25 MG tablet Take 1 tablet (6.25 mg total) by mouth daily. 11/28/19   Juline Patch, MD  Dapagliflozin-metFORMIN HCl ER (XIGDUO XR) 05-999 MG TB24 Take 1 tablet by mouth daily.    [provider]  diclofenac (VOLTAREN) 50 MG EC tablet Take 1 tablet (50 mg total) by mouth daily. 11/28/19   Juline Patch, MD  gabapentin (NEURONTIN) 800 MG tablet Take 800 mg by mouth 2 (two) times daily. As needed/ neuro    [provider]   lansoprazole (PREVACID) 30 MG capsule Take 1 capsule (30 mg total) by mouth daily at 12 noon. 11/28/19   Juline Patch, MD  levothyroxine (SYNTHROID) 50 MCG tablet Take 1 tablet (50 mcg total) by mouth daily. 11/28/19   Juline Patch, MD  losartan (COZAAR) 25 MG tablet Take 1 tablet (25 mg total) by mouth daily. 11/28/19   Juline Patch, MD  oxyCODONE-acetaminophen (PERCOCET) 5-325 MG tablet Take 1 tablet by mouth every 4 (four) hours as needed for severe pain. 11/29/19 11/28/20  Breean Nannini, Linden Dolin, PA-C  Semaglutide (OZEMPIC, 0.25 OR 0.5 MG/DOSE, [place]) Inject into the skin once a week. On Fridays    [provider]  sertraline (ZOLOFT) 50 MG tablet Take 1 tablet (50 mg total) by mouth daily. 11/28/19   Juline Patch, MD  tolterodine (DETROL LA) 4 MG 24 hr capsule Take 1 capsule (4 mg total) by mouth daily. 11/28/19   Juline Patch, MD  triamcinolone cream (KENALOG) 0.1 % Apply 1 application topically 2 (two) times daily. 06/27/19   Juline Patch, MD    Allergies Penicillins  Family History  Problem Relation Age of Onset  . Stroke Mother   . Heart disease Father   . Diabetes Father   . Hypertension Father     Social History Social History  Tobacco Use  . Smoking status: Never Smoker  . Smokeless tobacco: Never Used  Substance Use Topics  . Alcohol use: Never  . Drug use: Never    Review of Systems  Constitutional: No fever/chills Eyes: No visual changes. ENT: No sore throat. Respiratory: Denies cough Cardiovascular: Denies chest pain Gastrointestinal: Denies abdominal pain Genitourinary: Negative for dysuria. Musculoskeletal: Positive for back pain. Skin: Negative for rash. Psychiatric: no mood changes,     ____________________________________________   PHYSICAL EXAM:  VITAL SIGNS: ED Triage Vitals  Enc Vitals Group     BP 11/29/19 1048 106/61     Pulse Rate 11/29/19 1048 86     Resp 11/29/19 1048 16     Temp 11/29/19 1048 97.9 F (36.6 C)      Temp Source 11/29/19 1048 Oral     SpO2 11/29/19 1048 94 %     Weight 11/29/19 1049 183 lb (83 kg)     Height 11/29/19 1049 5' (1.524 m)     Head Circumference --      Peak Flow --      Pain Score 11/29/19 1049 9     Pain Loc --      Pain Edu? --      Excl. in GC? --     Constitutional: Alert and oriented. Well appearing and in no acute distress. Eyes: Conjunctivae are normal.  Head: Atraumatic. Nose: No congestion/rhinnorhea. Mouth/Throat: Mucous membranes are moist.   Neck:  supple no lymphadenopathy noted Cardiovascular: Normal rate, regular rhythm. Heart sounds are normal Respiratory: Normal respiratory effort.  No retractions, lungs c t a  GU: deferred Musculoskeletal: Thoracic and lumbar spine are tender to palpation, patient has difficulty rising from the wheelchair, neurovascular is intact at this time neurologic:  Normal speech and language.  Skin:  Skin is warm, dry and intact. No rash noted. Psychiatric: Mood and affect are normal. Speech and behavior are normal.  ____________________________________________   LABS (all labs ordered are listed, but only abnormal results are displayed)  Labs Reviewed - No data to display ____________________________________________   ____________________________________________  RADIOLOGY  CT of the T-spine and lumbar spine did not show any new abnormalities  ____________________________________________   PROCEDURES  Procedure(s) performed: No  Procedures    ____________________________________________   INITIAL IMPRESSION / ASSESSMENT AND PLAN / ED COURSE  Pertinent labs & imaging results that were available during my care of the patient were reviewed by me and considered in my medical decision making (see chart for details).   Patient 74 year old female presents emergency department with acute on chronic back pain.  See HPI  Physical exam shows patient to appear well.  Thoracic and lumbar spine are tender to  palpation.  Medial exams are unremarkable  CT thoracic and lumbar spine   CT of the T and lumbar spine do not show any new or acute abnormalities.  I did discuss findings with the patient.  I gave her pain medication here in the ED which is helped.  Gave her a prescription for additional Percocet and she is to follow-up with neurosurgery.  Explained to her they can order MRI or refer her for injections.  She states she understands will comply.  She was discharged stable condition.  Sara Carr was evaluated in Emergency Department on 11/29/2019 for the symptoms described in the history of present illness. She was evaluated in the context of the global COVID-19 pandemic, which necessitated consideration that the patient might be at risk for infection with the  SARS-CoV-2 virus that causes COVID-19. Institutional protocols and algorithms that pertain to the evaluation of patients at risk for COVID-19 are in a state of rapid change based on information released by regulatory bodies including the CDC and federal and state organizations. These policies and algorithms were followed during the patient's care in the ED.   As part of my medical decision making, I reviewed the following data within the electronic MEDICAL RECORD NUMBER Nursing notes reviewed and incorporated, Old chart reviewed, Radiograph reviewed , Notes from prior ED visits and [place] Controlled Substance Database  ____________________________________________   FINAL CLINICAL IMPRESSION(S) / ED DIAGNOSES  Final diagnoses:  Acute exacerbation of chronic low back pain      NEW MEDICATIONS STARTED DURING THIS VISIT:  Discharge Medication List as of 11/29/2019  1:12 PM    START taking these medications   Details  oxyCODONE-acetaminophen (PERCOCET) 5-325 MG tablet Take 1 tablet by mouth every 4 (four) hours as needed for severe pain., Starting Wed 11/29/2019, Until Thu 11/28/2020, Normal         Note:  This document was prepared using Dragon  voice recognition software and may include unintentional dictation errors.    Faythe Ghee, PA-C 11/29/19 1337    Concha Se, MD 11/29/19 [phone]

## 2019-12-05 ENCOUNTER — Other Ambulatory Visit: Payer: Self-pay | Admitting: Family Medicine

## 2019-12-05 MED ORDER — LEVOTHYROXINE SODIUM 50 MCG PO TABS: 50.0000 ug | ORAL_TABLET | Freq: Every day | ORAL | 1 refills | Status: DC

## 2019-12-05 NOTE — Telephone Encounter (Signed)
Medication Refill - Medication: levothyroxine (SYNTHROID) 50 MCG tablet    Preferred Pharmacy (with phone number or street name):  [facility] - [place], [place] - [address] Phone:  [phone]  Fax:  [phone]       Agent: Please be advised that RX refills may take up to 3 business days. We ask that you follow-up with your pharmacy.

## 2019-12-08 DIAGNOSIS — M545 Low back pain: Secondary | ICD-10-CM | POA: Diagnosis not present

## 2019-12-08 DIAGNOSIS — M5416 Radiculopathy, lumbar region: Secondary | ICD-10-CM | POA: Diagnosis not present

## 2019-12-08 DIAGNOSIS — M5441 Lumbago with sciatica, right side: Secondary | ICD-10-CM | POA: Diagnosis not present

## 2019-12-08 DIAGNOSIS — M5442 Lumbago with sciatica, left side: Secondary | ICD-10-CM | POA: Diagnosis not present

## 2019-12-11 ENCOUNTER — Other Ambulatory Visit: Payer: Self-pay | Admitting: Student

## 2019-12-11 ENCOUNTER — Other Ambulatory Visit (HOSPITAL_COMMUNITY): Payer: Self-pay | Admitting: Student

## 2019-12-11 DIAGNOSIS — M5416 Radiculopathy, lumbar region: Secondary | ICD-10-CM

## 2019-12-11 DIAGNOSIS — M5441 Lumbago with sciatica, right side: Secondary | ICD-10-CM

## 2019-12-11 DIAGNOSIS — M5442 Lumbago with sciatica, left side: Secondary | ICD-10-CM

## 2019-12-21 DIAGNOSIS — M6281 Muscle weakness (generalized): Secondary | ICD-10-CM | POA: Diagnosis not present

## 2019-12-21 DIAGNOSIS — M5416 Radiculopathy, lumbar region: Secondary | ICD-10-CM | POA: Diagnosis not present

## 2019-12-24 ENCOUNTER — Ambulatory Visit
Admission: RE | Admit: 2019-12-24 | Discharge: 2019-12-24 | Disposition: A | Discharge status: Home / Self Care | Payer: Medicare PPO | Source: Ambulatory Visit | Attending: Student | Admitting: Student

## 2019-12-24 ENCOUNTER — Other Ambulatory Visit: Payer: Self-pay

## 2019-12-24 DIAGNOSIS — M5441 Lumbago with sciatica, right side: Secondary | ICD-10-CM | POA: Diagnosis not present

## 2019-12-24 DIAGNOSIS — M5416 Radiculopathy, lumbar region: Secondary | ICD-10-CM | POA: Insufficient documentation

## 2019-12-24 DIAGNOSIS — M5442 Lumbago with sciatica, left side: Secondary | ICD-10-CM | POA: Insufficient documentation

## 2019-12-24 DIAGNOSIS — M545 Low back pain: Secondary | ICD-10-CM | POA: Diagnosis not present

## 2019-12-25 ENCOUNTER — Ambulatory Visit: Payer: Medicare PPO | Admitting: Family Medicine

## 2020-01-03 DIAGNOSIS — M6281 Muscle weakness (generalized): Secondary | ICD-10-CM | POA: Diagnosis not present

## 2020-01-03 DIAGNOSIS — M5416 Radiculopathy, lumbar region: Secondary | ICD-10-CM | POA: Diagnosis not present

## 2020-01-09 ENCOUNTER — Other Ambulatory Visit: Payer: Self-pay | Admitting: Neurosurgery

## 2020-01-09 DIAGNOSIS — M48062 Spinal stenosis, lumbar region with neurogenic claudication: Secondary | ICD-10-CM | POA: Diagnosis not present

## 2020-01-10 DIAGNOSIS — M6281 Muscle weakness (generalized): Secondary | ICD-10-CM | POA: Diagnosis not present

## 2020-01-10 DIAGNOSIS — M5416 Radiculopathy, lumbar region: Secondary | ICD-10-CM | POA: Diagnosis not present

## 2020-01-12 DIAGNOSIS — M48062 Spinal stenosis, lumbar region with neurogenic claudication: Secondary | ICD-10-CM | POA: Diagnosis not present

## 2020-01-17 ENCOUNTER — Encounter
Admission: RE | Admit: 2020-01-17 | Discharge: 2020-01-17 | Disposition: A | Discharge status: Home / Self Care | Payer: Medicare PPO | Source: Ambulatory Visit | Attending: Neurosurgery | Admitting: Neurosurgery

## 2020-01-17 ENCOUNTER — Other Ambulatory Visit: Payer: Self-pay

## 2020-01-17 DIAGNOSIS — M5416 Radiculopathy, lumbar region: Secondary | ICD-10-CM | POA: Diagnosis not present

## 2020-01-17 DIAGNOSIS — M6281 Muscle weakness (generalized): Secondary | ICD-10-CM | POA: Diagnosis not present

## 2020-01-17 HISTORY — DX: Anemia, unspecified: D64.9

## 2020-01-17 HISTORY — DX: Gastro-esophageal reflux disease without esophagitis: K21.9

## 2020-01-17 HISTORY — DX: Hypothyroidism, unspecified: E03.9

## 2020-01-17 HISTORY — DX: Unspecified osteoarthritis, unspecified site: M19.90

## 2020-01-17 NOTE — Patient Instructions (Signed)
Your procedure is scheduled on: Monday January 22, 2020 Report to Day Surgery. To find out your arrival time please call [phone] between 1PM - 3PM on Friday January 19, 2020.  Remember: Instructions that are not followed completely may result in serious medical risk,  up to and including death, or upon the discretion of your surgeon and anesthesiologist your  surgery may need to be rescheduled.     _X__ 1. Do not eat food after midnight the night before your procedure.                 No gum chewing or hard candies. You may drink clear liquids up to 2 hours                 before you are scheduled to arrive for your surgery- DO not drink clear                 liquids within 2 hours of the start of your surgery.                 Clear Liquids include:  water, apple juice without pulp, clear Gatorade, G2 or                  Gatorade Zero (avoid Red/Purple/Blue), Black Coffee or Tea (Do not add                 anything to coffee or tea).  __X__2.  On the morning of surgery brush your teeth with toothpaste and water, you                may rinse your mouth with mouthwash if you wish.  Do not swallow any toothpaste of mouthwash.     _X__ 3.  No Alcohol for 24 hours before or after surgery.   _X__ 4.  Do Not Smoke or use e-cigarettes For 24 Hours Prior to Your Surgery.                 Do not use any chewable tobacco products for at least 6 hours prior to                 Surgery.  _X__  5.  Do not use any recreational drugs (marijuana, cocaine, heroin, ecstacy, MDMA or other)                For at least one week prior to your surgery.  Combination of these drugs with anesthesia                May have life threatening results.  _X___ 6.  Notify your doctor if there is any change in your medical condition      (cold, fever, infections).     Do not wear jewelry, make-up, hairpins, clips or nail polish. Do not wear lotions, powders, or perfumes. You may wear deodorant. Do  not shave 48 hours prior to surgery. Men may shave face and neck. Do not bring valuables to the hospital.    [facility] is not responsible for any belongings or valuables.  Contacts, dentures or bridgework may not be worn into surgery. Leave your suitcase in the car. After surgery it may be brought to your room. For patients admitted to the hospital, discharge time is determined by your treatment team.   Patients discharged the day of surgery will not be allowed to drive home.   Make arrangements for someone to be with you for the first  24 hours of your Same Day Discharge.  ____ Take these medicines the morning of surgery with A SIP OF WATER:    1. none   __X__ Use CHG Soap as directed  __X__ Stop INVOKAMET XR 150-500 MG 2 days prior to surgery (Last dose will be Friday January 19, 2020)     __X__ Stop Anti-inflammatories such as Ibuprofen, Aleve, naproxen aspirin and or BC powders.    __X__ Stop supplements until after surgery.    __X__ Do not start any herbal supplements before your surgery.

## 2020-01-18 ENCOUNTER — Encounter
Admission: RE | Admit: 2020-01-18 | Discharge: 2020-01-18 | Disposition: A | Discharge status: Home / Self Care | Payer: Medicare PPO | Source: Ambulatory Visit | Attending: Neurosurgery | Admitting: Neurosurgery

## 2020-01-18 ENCOUNTER — Other Ambulatory Visit: Payer: Self-pay

## 2020-01-18 DIAGNOSIS — Z01818 Encounter for other preprocedural examination: Secondary | ICD-10-CM | POA: Diagnosis not present

## 2020-01-18 DIAGNOSIS — I10 Essential (primary) hypertension: Secondary | ICD-10-CM | POA: Diagnosis not present

## 2020-01-18 DIAGNOSIS — Z20822 Contact with and (suspected) exposure to covid-19: Secondary | ICD-10-CM | POA: Insufficient documentation

## 2020-01-18 LAB — SURGICAL PCR SCREEN
MRSA, PCR: NEGATIVE
Staphylococcus aureus: POSITIVE — AB

## 2020-01-18 LAB — TYPE AND SCREEN
ABO/RH(D): O POS
Antibody Screen: NEGATIVE

## 2020-01-18 LAB — BASIC METABOLIC PANEL
Anion gap: 9 (ref 5–15)
BUN: 21 mg/dL (ref 8–23)
CO2: 26 mmol/L (ref 22–32)
Calcium: 9.2 mg/dL (ref 8.9–10.3)
Chloride: 108 mmol/L (ref 98–111)
Creatinine, Ser: 0.82 mg/dL (ref 0.44–1.00)
GFR calc Af Amer: >60 mL/min (ref >60)
GFR calc non Af Amer: >60 mL/min (ref >60)
Glucose, Bld: 142 mg/dL — ABNORMAL HIGH (ref 70–99)
Potassium: 4.1 mmol/L (ref 3.5–5.1)
Sodium: 143 mmol/L (ref 135–145)

## 2020-01-18 LAB — APTT: aPTT: 33 seconds (ref 24–36)

## 2020-01-18 LAB — CBC
HCT: 41 % (ref 36.0–46.0)
Hemoglobin: 12.8 g/dL (ref 12.0–15.0)
MCH: 26.1 pg (ref 26.0–34.0)
MCHC: 31.2 g/dL (ref 30.0–36.0)
MCV: 83.5 fL (ref 80.0–100.0)
Platelets: 233 10*3/uL (ref 150–400)
RBC: 4.91 MIL/uL (ref 3.87–5.11)
RDW: 17.2 % — ABNORMAL HIGH (ref 11.5–15.5)
WBC: 6.3 10*3/uL (ref 4.0–10.5)
nRBC: 0 % (ref 0.0–0.2)

## 2020-01-18 LAB — SARS CORONAVIRUS 2 (TAT 6-24 HRS): SARS Coronavirus 2: NEGATIVE

## 2020-01-18 LAB — PROTIME-INR
INR: 1 (ref 0.8–1.2)
Prothrombin Time: 12.7 seconds (ref 11.4–15.2)

## 2020-01-22 ENCOUNTER — Ambulatory Visit: Payer: Medicare PPO

## 2020-01-22 ENCOUNTER — Observation Stay
Admission: RE | Admit: 2020-01-22 | Discharge: 2020-01-23 | Disposition: A | Discharge status: Home / Self Care | Payer: Medicare PPO | Attending: Neurosurgery | Admitting: Neurosurgery

## 2020-01-22 ENCOUNTER — Other Ambulatory Visit: Payer: Self-pay

## 2020-01-22 ENCOUNTER — Ambulatory Visit: Payer: Medicare PPO | Admitting: Anesthesiology

## 2020-01-22 ENCOUNTER — Encounter
Admission: RE | Disposition: A | Discharge status: Home / Self Care | Payer: Self-pay | Source: Home / Self Care | Attending: Neurosurgery

## 2020-01-22 ENCOUNTER — Encounter: Payer: Self-pay | Admitting: Neurosurgery

## 2020-01-22 DIAGNOSIS — Z833 Family history of diabetes mellitus: Secondary | ICD-10-CM | POA: Insufficient documentation

## 2020-01-22 DIAGNOSIS — E785 Hyperlipidemia, unspecified: Secondary | ICD-10-CM | POA: Insufficient documentation

## 2020-01-22 DIAGNOSIS — Z880 Allergy status to penicillin: Secondary | ICD-10-CM | POA: Diagnosis not present

## 2020-01-22 DIAGNOSIS — M48062 Spinal stenosis, lumbar region with neurogenic claudication: Secondary | ICD-10-CM | POA: Diagnosis not present

## 2020-01-22 DIAGNOSIS — Z981 Arthrodesis status: Secondary | ICD-10-CM | POA: Insufficient documentation

## 2020-01-22 DIAGNOSIS — Z96653 Presence of artificial knee joint, bilateral: Secondary | ICD-10-CM | POA: Diagnosis not present

## 2020-01-22 DIAGNOSIS — I10 Essential (primary) hypertension: Secondary | ICD-10-CM | POA: Insufficient documentation

## 2020-01-22 DIAGNOSIS — E039 Hypothyroidism, unspecified: Secondary | ICD-10-CM | POA: Diagnosis not present

## 2020-01-22 DIAGNOSIS — Z7984 Long term (current) use of oral hypoglycemic drugs: Secondary | ICD-10-CM | POA: Insufficient documentation

## 2020-01-22 DIAGNOSIS — K219 Gastro-esophageal reflux disease without esophagitis: Secondary | ICD-10-CM | POA: Diagnosis not present

## 2020-01-22 DIAGNOSIS — E119 Type 2 diabetes mellitus without complications: Secondary | ICD-10-CM | POA: Insufficient documentation

## 2020-01-22 DIAGNOSIS — Z8249 Family history of ischemic heart disease and other diseases of the circulatory system: Secondary | ICD-10-CM | POA: Diagnosis not present

## 2020-01-22 DIAGNOSIS — M1990 Unspecified osteoarthritis, unspecified site: Secondary | ICD-10-CM | POA: Insufficient documentation

## 2020-01-22 DIAGNOSIS — M5416 Radiculopathy, lumbar region: Secondary | ICD-10-CM | POA: Insufficient documentation

## 2020-01-22 DIAGNOSIS — Z79899 Other long term (current) drug therapy: Secondary | ICD-10-CM | POA: Insufficient documentation

## 2020-01-22 DIAGNOSIS — F329 Major depressive disorder, single episode, unspecified: Secondary | ICD-10-CM | POA: Insufficient documentation

## 2020-01-22 DIAGNOSIS — Z419 Encounter for procedure for purposes other than remedying health state, unspecified: Secondary | ICD-10-CM

## 2020-01-22 DIAGNOSIS — Z79890 Hormone replacement therapy (postmenopausal): Secondary | ICD-10-CM | POA: Diagnosis not present

## 2020-01-22 HISTORY — PX: LUMBAR LAMINECTOMY/ DECOMPRESSION WITH MET-RX: SHX5959

## 2020-01-22 LAB — GLUCOSE, CAPILLARY
Glucose-Capillary: 135 mg/dL — ABNORMAL HIGH (ref 70–99)
Glucose-Capillary: 166 mg/dL — ABNORMAL HIGH (ref 70–99)
Glucose-Capillary: 177 mg/dL — ABNORMAL HIGH (ref 70–99)
Glucose-Capillary: 172 mg/dL — ABNORMAL HIGH (ref 70–99)
Glucose-Capillary: 183 mg/dL — ABNORMAL HIGH (ref 70–99)

## 2020-01-22 LAB — HEMOGLOBIN A1C
Hgb A1c MFr Bld: 8.5 % — ABNORMAL HIGH (ref 4.8–5.6)
Mean Plasma Glucose: 197.25 mg/dL

## 2020-01-22 LAB — ABO/RH: ABO/RH(D): O POS

## 2020-01-22 SURGERY — LUMBAR LAMINECTOMY/ DECOMPRESSION WITH MET-RX
Anesthesia: General

## 2020-01-22 MED ORDER — ORAL CARE MOUTH RINSE: 15.0000 mL | Freq: Once | OROMUCOSAL | Status: AC

## 2020-01-22 MED ORDER — CIPROFLOXACIN IN D5W 400 MG/200ML IV SOLN
INTRAVENOUS | Status: AC
  Filled 2020-01-22: qty 200

## 2020-01-22 MED ORDER — CHLORHEXIDINE GLUCONATE 0.12 % MT SOLN: 15.0000 mL | Freq: Once | OROMUCOSAL | Status: AC

## 2020-01-22 MED ORDER — FENTANYL CITRATE (PF) 100 MCG/2ML IJ SOLN
INTRAMUSCULAR | Status: AC
  Administered 2020-01-22: 25 ug via INTRAVENOUS
  Filled 2020-01-22: qty 2

## 2020-01-22 MED ORDER — PANTOPRAZOLE SODIUM 40 MG PO TBEC
40.0000 mg | DELAYED_RELEASE_TABLET | Freq: Every evening | ORAL | Status: DC
  Administered 2020-01-22: 40 mg via ORAL
  Filled 2020-01-22: qty 1

## 2020-01-22 MED ORDER — ONDANSETRON HCL 4 MG/2ML IJ SOLN
INTRAMUSCULAR | Status: DC | PRN
  Administered 2020-01-22: 4 mg via INTRAVENOUS

## 2020-01-22 MED ORDER — LIDOCAINE HCL 4 % EX SOLN
CUTANEOUS | Status: DC | PRN
Start: 2020-01-22 — End: 2020-01-22
  Administered 2020-01-22: 4 mL via TOPICAL

## 2020-01-22 MED ORDER — LIDOCAINE HCL (PF) 2 % IJ SOLN
INTRAMUSCULAR | Status: AC
  Filled 2020-01-22: qty 5

## 2020-01-22 MED ORDER — OXYCODONE HCL 5 MG/5ML PO SOLN: 5.0000 mg | Freq: Once | ORAL | Status: DC | PRN

## 2020-01-22 MED ORDER — PHENYLEPHRINE HCL (PRESSORS) 10 MG/ML IV SOLN
INTRAVENOUS | Status: DC | PRN
  Administered 2020-01-22: 200 ug via INTRAVENOUS
  Administered 2020-01-22 (×3): 100 ug via INTRAVENOUS

## 2020-01-22 MED ORDER — METHOCARBAMOL 1000 MG/10ML IJ SOLN
500.0000 mg | Freq: Four times a day (QID) | INTRAVENOUS | Status: DC | PRN
  Filled 2020-01-22: qty 5

## 2020-01-22 MED ORDER — LIDOCAINE HCL (CARDIAC) PF 100 MG/5ML IV SOSY
PREFILLED_SYRINGE | INTRAVENOUS | Status: DC | PRN
  Administered 2020-01-22: 80 mg via INTRAVENOUS

## 2020-01-22 MED ORDER — CIPROFLOXACIN IN D5W 400 MG/200ML IV SOLN
400.0000 mg | Freq: Once | INTRAVENOUS | Status: AC
  Administered 2020-01-22: 400 mg via INTRAVENOUS

## 2020-01-22 MED ORDER — SODIUM CHLORIDE 0.9% FLUSH
3.0000 mL | Freq: Two times a day (BID) | INTRAVENOUS | Status: DC
  Administered 2020-01-22 – 2020-01-23 (×3): 3 mL via INTRAVENOUS

## 2020-01-22 MED ORDER — PROPOFOL 10 MG/ML IV BOLUS
INTRAVENOUS | Status: DC | PRN
  Administered 2020-01-22: 160 mg via INTRAVENOUS

## 2020-01-22 MED ORDER — KETAMINE HCL 50 MG/ML IJ SOLN
INTRAMUSCULAR | Status: DC | PRN
Start: 2020-01-22 — End: 2020-01-22
  Administered 2020-01-22: 25 mg via INTRAMUSCULAR

## 2020-01-22 MED ORDER — HYDROMORPHONE HCL 1 MG/ML IJ SOLN: 0.5000 mg | INTRAMUSCULAR | Status: DC | PRN

## 2020-01-22 MED ORDER — PHENOL 1.4 % MT LIQD
1.0000 | OROMUCOSAL | Status: DC | PRN
  Filled 2020-01-22: qty 177

## 2020-01-22 MED ORDER — CARVEDILOL 3.125 MG PO TABS
6.2500 mg | ORAL_TABLET | Freq: Every day | ORAL | Status: DC
  Administered 2020-01-23: 6.25 mg via ORAL
  Filled 2020-01-22: qty 2

## 2020-01-22 MED ORDER — BISACODYL 10 MG RE SUPP: 10.0000 mg | Freq: Every day | RECTAL | Status: DC | PRN

## 2020-01-22 MED ORDER — SERTRALINE HCL 50 MG PO TABS
50.0000 mg | ORAL_TABLET | Freq: Every day | ORAL | Status: DC
  Administered 2020-01-22 – 2020-01-23 (×2): 50 mg via ORAL
  Filled 2020-01-22 (×2): qty 1

## 2020-01-22 MED ORDER — CHLORHEXIDINE GLUCONATE 0.12 % MT SOLN
OROMUCOSAL | Status: AC
  Administered 2020-01-22: 15 mL via OROMUCOSAL
  Filled 2020-01-22: qty 15

## 2020-01-22 MED ORDER — KETAMINE HCL 50 MG/ML IJ SOLN
INTRAMUSCULAR | Status: AC
  Filled 2020-01-22: qty 10

## 2020-01-22 MED ORDER — LEVOTHYROXINE SODIUM 50 MCG PO TABS
75.0000 ug | ORAL_TABLET | Freq: Every day | ORAL | Status: DC
  Administered 2020-01-23: 75 ug via ORAL
  Filled 2020-01-22: qty 1

## 2020-01-22 MED ORDER — GLYCOPYRROLATE 0.2 MG/ML IJ SOLN
INTRAMUSCULAR | Status: DC | PRN
  Administered 2020-01-22: .1 mg via INTRAVENOUS

## 2020-01-22 MED ORDER — THROMBIN 5000 UNITS EX SOLR
CUTANEOUS | Status: DC | PRN
  Administered 2020-01-22: 5000 [IU] via TOPICAL

## 2020-01-22 MED ORDER — INSULIN ASPART 100 UNIT/ML ~~LOC~~ SOLN
0.0000 [IU] | Freq: Three times a day (TID) | SUBCUTANEOUS | Status: DC
  Administered 2020-01-22 – 2020-01-23 (×3): 3 [IU] via SUBCUTANEOUS
  Filled 2020-01-22 (×3): qty 1

## 2020-01-22 MED ORDER — FLEET ENEMA 7-19 GM/118ML RE ENEM: 1.0000 | ENEMA | Freq: Once | RECTAL | Status: DC | PRN

## 2020-01-22 MED ORDER — METHOCARBAMOL 500 MG PO TABS
500.0000 mg | ORAL_TABLET | Freq: Four times a day (QID) | ORAL | Status: DC | PRN
  Administered 2020-01-23: 500 mg via ORAL
  Filled 2020-01-22: qty 1

## 2020-01-22 MED ORDER — GABAPENTIN 600 MG PO TABS
600.0000 mg | ORAL_TABLET | Freq: Two times a day (BID) | ORAL | Status: DC
  Administered 2020-01-22 – 2020-01-23 (×3): 600 mg via ORAL
  Filled 2020-01-22 (×4): qty 1

## 2020-01-22 MED ORDER — SUCCINYLCHOLINE CHLORIDE 20 MG/ML IJ SOLN
INTRAMUSCULAR | Status: DC | PRN
  Administered 2020-01-22: 120 mg via INTRAVENOUS

## 2020-01-22 MED ORDER — CEFAZOLIN SODIUM-DEXTROSE 2-4 GM/100ML-% IV SOLN: 2.0000 g | INTRAVENOUS | Status: DC

## 2020-01-22 MED ORDER — FENTANYL CITRATE (PF) 100 MCG/2ML IJ SOLN
INTRAMUSCULAR | Status: AC
  Filled 2020-01-22: qty 2

## 2020-01-22 MED ORDER — MENTHOL 3 MG MT LOZG
1.0000 | LOZENGE | OROMUCOSAL | Status: DC | PRN
  Filled 2020-01-22: qty 9

## 2020-01-22 MED ORDER — FENTANYL CITRATE (PF) 100 MCG/2ML IJ SOLN
25.0000 ug | INTRAMUSCULAR | Status: DC | PRN
  Administered 2020-01-22: 25 ug via INTRAVENOUS

## 2020-01-22 MED ORDER — METHYLPREDNISOLONE ACETATE 40 MG/ML IJ SUSP
INTRAMUSCULAR | Status: DC | PRN
  Administered 2020-01-22: 40 mg

## 2020-01-22 MED ORDER — ACETAMINOPHEN 10 MG/ML IV SOLN
INTRAVENOUS | Status: AC
  Filled 2020-01-22: qty 100

## 2020-01-22 MED ORDER — DEXAMETHASONE SODIUM PHOSPHATE 10 MG/ML IJ SOLN
INTRAMUSCULAR | Status: AC
  Filled 2020-01-22: qty 1

## 2020-01-22 MED ORDER — PROPOFOL 10 MG/ML IV BOLUS
INTRAVENOUS | Status: AC
  Filled 2020-01-22: qty 20

## 2020-01-22 MED ORDER — CEFAZOLIN SODIUM-DEXTROSE 2-4 GM/100ML-% IV SOLN
INTRAVENOUS | Status: AC
  Filled 2020-01-22: qty 100

## 2020-01-22 MED ORDER — MIDAZOLAM HCL 2 MG/2ML IJ SOLN
INTRAMUSCULAR | Status: AC
  Filled 2020-01-22: qty 2

## 2020-01-22 MED ORDER — DEXAMETHASONE SODIUM PHOSPHATE 10 MG/ML IJ SOLN
INTRAMUSCULAR | Status: DC | PRN
  Administered 2020-01-22: 5 mg via INTRAVENOUS

## 2020-01-22 MED ORDER — SODIUM CHLORIDE 0.9 % IV SOLN
INTRAVENOUS | Status: DC | PRN
  Administered 2020-01-22: 25 ug/min via INTRAVENOUS

## 2020-01-22 MED ORDER — LOSARTAN POTASSIUM 25 MG PO TABS
25.0000 mg | ORAL_TABLET | Freq: Every day | ORAL | Status: DC
  Administered 2020-01-22 – 2020-01-23 (×2): 25 mg via ORAL
  Filled 2020-01-22 (×2): qty 1

## 2020-01-22 MED ORDER — SENNA 8.6 MG PO TABS
1.0000 | ORAL_TABLET | Freq: Two times a day (BID) | ORAL | Status: DC
  Administered 2020-01-22 – 2020-01-23 (×2): 8.6 mg via ORAL
  Filled 2020-01-22 (×2): qty 1

## 2020-01-22 MED ORDER — OXYCODONE HCL 5 MG PO TABS
5.0000 mg | ORAL_TABLET | ORAL | Status: DC | PRN
  Administered 2020-01-23: 5 mg via ORAL
  Filled 2020-01-22: qty 1

## 2020-01-22 MED ORDER — SODIUM CHLORIDE 0.9% FLUSH
3.0000 mL | INTRAVENOUS | Status: DC | PRN
  Administered 2020-01-22: 3 mL via INTRAVENOUS

## 2020-01-22 MED ORDER — FENTANYL CITRATE (PF) 100 MCG/2ML IJ SOLN
INTRAMUSCULAR | Status: DC | PRN
  Administered 2020-01-22 (×4): 50 ug via INTRAVENOUS

## 2020-01-22 MED ORDER — ONDANSETRON HCL 4 MG/2ML IJ SOLN
INTRAMUSCULAR | Status: AC
  Filled 2020-01-22: qty 2

## 2020-01-22 MED ORDER — ONDANSETRON HCL 4 MG PO TABS: 4.0000 mg | ORAL_TABLET | Freq: Four times a day (QID) | ORAL | Status: DC | PRN

## 2020-01-22 MED ORDER — LIDOCAINE-EPINEPHRINE (PF) 1 %-1:200000 IJ SOLN
INTRAMUSCULAR | Status: AC
  Filled 2020-01-22: qty 30

## 2020-01-22 MED ORDER — OXYCODONE HCL 5 MG PO TABS: 5.0000 mg | ORAL_TABLET | Freq: Once | ORAL | Status: DC | PRN

## 2020-01-22 MED ORDER — SODIUM CHLORIDE 0.9 % IV SOLN: 250.0000 mL | INTRAVENOUS | Status: DC

## 2020-01-22 MED ORDER — FAMOTIDINE 20 MG PO TABS: 20.0000 mg | ORAL_TABLET | Freq: Once | ORAL | Status: AC

## 2020-01-22 MED ORDER — FAMOTIDINE 20 MG PO TABS
ORAL_TABLET | ORAL | Status: AC
  Administered 2020-01-22: 20 mg via ORAL
  Filled 2020-01-22: qty 1

## 2020-01-22 MED ORDER — ATORVASTATIN CALCIUM 20 MG PO TABS
40.0000 mg | ORAL_TABLET | Freq: Every day | ORAL | Status: DC
  Administered 2020-01-22 – 2020-01-23 (×2): 40 mg via ORAL
  Filled 2020-01-22 (×2): qty 2

## 2020-01-22 MED ORDER — LEVOTHYROXINE SODIUM 50 MCG PO TABS: 50.0000 ug | ORAL_TABLET | Freq: Every day | ORAL | Status: DC

## 2020-01-22 MED ORDER — POLYETHYLENE GLYCOL 3350 17 G PO PACK: 17.0000 g | PACK | Freq: Every day | ORAL | Status: DC | PRN

## 2020-01-22 MED ORDER — ACETAMINOPHEN 650 MG RE SUPP: 650.0000 mg | RECTAL | Status: DC | PRN

## 2020-01-22 MED ORDER — ACETAMINOPHEN 325 MG PO TABS
650.0000 mg | ORAL_TABLET | ORAL | Status: DC | PRN
  Administered 2020-01-23: 650 mg via ORAL
  Filled 2020-01-22: qty 2

## 2020-01-22 MED ORDER — BUPIVACAINE HCL (PF) 0.5 % IJ SOLN
INTRAMUSCULAR | Status: AC
  Filled 2020-01-22: qty 30

## 2020-01-22 MED ORDER — SODIUM CHLORIDE 0.9 % IV SOLN: INTRAVENOUS | Status: DC

## 2020-01-22 MED ORDER — THROMBIN 5000 UNITS EX SOLR
CUTANEOUS | Status: AC
  Filled 2020-01-22: qty 5000

## 2020-01-22 MED ORDER — OXYCODONE HCL 5 MG PO TABS
10.0000 mg | ORAL_TABLET | ORAL | Status: DC | PRN
  Administered 2020-01-23: 10 mg via ORAL
  Filled 2020-01-22: qty 2

## 2020-01-22 MED ORDER — LIDOCAINE-EPINEPHRINE (PF) 1 %-1:200000 IJ SOLN
INTRAMUSCULAR | Status: DC | PRN
  Administered 2020-01-22: 19 mL

## 2020-01-22 MED ORDER — SUCCINYLCHOLINE CHLORIDE 200 MG/10ML IV SOSY
PREFILLED_SYRINGE | INTRAVENOUS | Status: AC
  Filled 2020-01-22: qty 10

## 2020-01-22 MED ORDER — ACETAMINOPHEN 10 MG/ML IV SOLN
INTRAVENOUS | Status: DC | PRN
  Administered 2020-01-22: 1000 mg via INTRAVENOUS

## 2020-01-22 MED ORDER — METHYLPREDNISOLONE ACETATE 40 MG/ML IJ SUSP
INTRAMUSCULAR | Status: AC
  Filled 2020-01-22: qty 1

## 2020-01-22 MED ORDER — ONDANSETRON HCL 4 MG/2ML IJ SOLN: 4.0000 mg | Freq: Four times a day (QID) | INTRAMUSCULAR | Status: DC | PRN

## 2020-01-22 SURGICAL SUPPLY — 57 items
BUR NEURO DRILL SOFT 3.0X3.8M (BURR) ×2 IMPLANT
CANISTER SUCT 1200ML W/VALVE (MISCELLANEOUS) ×4 IMPLANT
CHLORAPREP W/TINT 26 (MISCELLANEOUS) ×4 IMPLANT
COUNTER NEEDLE 20/40 LG (NEEDLE) ×2 IMPLANT
COVER LIGHT HANDLE STERIS (MISCELLANEOUS) ×4 IMPLANT
COVER WAND RF STERILE (DRAPES) ×2 IMPLANT
CUP MEDICINE 2OZ PLAST GRAD ST (MISCELLANEOUS) ×2 IMPLANT
DERMABOND ADVANCED (GAUZE/BANDAGES/DRESSINGS) ×1
DERMABOND ADVANCED .7 DNX12 (GAUZE/BANDAGES/DRESSINGS) ×1 IMPLANT
DRAPE C-ARM 42X72 X-RAY (DRAPES) ×4 IMPLANT
DRAPE LAPAROTOMY 100X77 ABD (DRAPES) ×2 IMPLANT
DRAPE MICROSCOPE SPINE 48X150 (DRAPES) IMPLANT
DRAPE SURG 17X11 SM STRL (DRAPES) ×2 IMPLANT
DRSG TEGADERM 4X4.75 (GAUZE/BANDAGES/DRESSINGS) IMPLANT
DRSG TELFA 4X3 1S NADH ST (GAUZE/BANDAGES/DRESSINGS) IMPLANT
DURASEAL APPLICATOR TIP (TIP) IMPLANT
DURASEAL SPINE SEALANT 3ML (MISCELLANEOUS) IMPLANT
ELECT CAUTERY BLADE TIP 2.5 (TIP) ×2
ELECT EZSTD 165MM 6.5IN (MISCELLANEOUS) ×2
ELECT REM PT RETURN 9FT ADLT (ELECTROSURGICAL) ×2
ELECTRODE CAUTERY BLDE TIP 2.5 (TIP) ×1 IMPLANT
ELECTRODE EZSTD 165MM 6.5IN (MISCELLANEOUS) ×1 IMPLANT
ELECTRODE REM PT RTRN 9FT ADLT (ELECTROSURGICAL) ×1 IMPLANT
GAUZE SPONGE 4X4 12PLY STRL (GAUZE/BANDAGES/DRESSINGS) ×2 IMPLANT
GLOVE BIOGEL PI IND STRL 7.0 (GLOVE) ×1 IMPLANT
GLOVE BIOGEL PI INDICATOR 7.0 (GLOVE) ×1
GLOVE INDICATOR 8.0 STRL GRN (GLOVE) ×6 IMPLANT
GLOVE SURG SYN 7.0 (GLOVE) ×4 IMPLANT
GLOVE SURG SYN 7.0 PF PI (GLOVE) ×2 IMPLANT
GLOVE SURG SYN 8.0 (GLOVE) ×2 IMPLANT
GLOVE SURG SYN 8.0 PF PI (GLOVE) ×1 IMPLANT
GOWN STRL REUS W/ TWL XL LVL3 (GOWN DISPOSABLE) ×2 IMPLANT
GOWN STRL REUS W/TWL XL LVL3 (GOWN DISPOSABLE) ×2
GRADUATE 1200CC STRL 31836 (MISCELLANEOUS) ×2 IMPLANT
IV CATH ANGIO 12GX3 LT BLUE (NEEDLE) ×2 IMPLANT
KIT TURNOVER KIT A (KITS) ×2 IMPLANT
KIT WILSON FRAME (KITS) ×2 IMPLANT
KNIFE BAYONET SHORT DISCETOMY (MISCELLANEOUS) IMPLANT
MARKER SKIN DUAL TIP RULER LAB (MISCELLANEOUS) ×4 IMPLANT
NDL SAFETY ECLIPSE 18X1.5 (NEEDLE) ×1 IMPLANT
NEEDLE HYPO 18GX1.5 SHARP (NEEDLE) ×1
NEEDLE HYPO 22GX1.5 SAFETY (NEEDLE) ×2 IMPLANT
NS IRRIG 1000ML POUR BTL (IV SOLUTION) ×2 IMPLANT
PACK LAMINECTOMY NEURO (CUSTOM PROCEDURE TRAY) ×2 IMPLANT
PAD ARMBOARD 7.5X6 YLW CONV (MISCELLANEOUS) ×1 IMPLANT
SPOGE SURGIFLO 8M (HEMOSTASIS) ×1
SPONGE SURGIFLO 8M (HEMOSTASIS) ×1 IMPLANT
STAPLER SKIN PROX 35W (STAPLE) IMPLANT
SUT NURALON 4 0 TR CR/8 (SUTURE) IMPLANT
SUT POLYSORB 2-0 5X18 GS-10 (SUTURE) ×4 IMPLANT
SUT VIC AB 0 CT1 18XCR BRD 8 (SUTURE) ×1 IMPLANT
SUT VIC AB 0 CT1 8-18 (SUTURE) ×1
SYR 10ML LL (SYRINGE) ×4 IMPLANT
SYR 30ML LL (SYRINGE) ×2 IMPLANT
SYR 3ML LL SCALE MARK (SYRINGE) ×2 IMPLANT
TOWEL OR 17X26 4PK STRL BLUE (TOWEL DISPOSABLE) ×6 IMPLANT
TUBING CONNECTING 10 (TUBING) ×2 IMPLANT

## 2020-01-22 NOTE — Interval H&P Note (Signed)
History and Physical Interval Note:  01/22/2020 6:47 AM  Sara Carr  has presented today for surgery, with the diagnosis of m48.062 lumbar stenosis with neurogenic claudication.  The various methods of treatment have been discussed with the patient and family. After consideration of risks, benefits and other options for treatment, the patient has consented to  Procedure(s): LUMBAR LAMINECTOMY/ DECOMPRESSION WITH MET-RX (N/A) as a surgical intervention.  The patient's history has been reviewed, patient examined, no change in status, stable for surgery.  I have reviewed the patient's chart and labs.  Questions were answered to the patient's satisfaction.     [name]

## 2020-01-22 NOTE — Transfer of Care (Signed)
Immediate Anesthesia Transfer of Care Note  Patient: Sara Carr  Procedure(s) Performed: L3-4 laminectomy (N/A )  Patient Location: PACU  Anesthesia Type:General  Level of Consciousness: drowsy  Airway & Oxygen Therapy: Patient Spontanous Breathing and Patient connected to face mask oxygen  Post-op Assessment: Report given to RN and Post -op Vital signs reviewed and stable  Post vital signs: Reviewed and stable  Last Vitals:  Vitals Value Taken Time  BP 160/87 01/22/20 0953  Temp    Pulse 86 01/22/20 0956  Resp 19 01/22/20 0956  SpO2 99 % 01/22/20 0956  Vitals shown include unvalidated device data.  Last Pain:  Vitals:   01/22/20 0621  TempSrc: Tympanic  PainSc: 0-No pain         Complications: No apparent anesthesia complications

## 2020-01-22 NOTE — Anesthesia Procedure Notes (Signed)
Procedure Name: Intubation Date/Time: 01/22/2020 7:23 AM Performed by: Rosanne Gutting, CRNA Pre-anesthesia Checklist: Patient identified, Patient being monitored, Timeout performed, Emergency Drugs available and Suction available Patient Re-evaluated:Patient Re-evaluated prior to induction Oxygen Delivery Method: Circle system utilized Preoxygenation: Pre-oxygenation with 100% oxygen Induction Type: IV induction Ventilation: Mask ventilation without difficulty and Oral airway inserted - appropriate to patient size Laryngoscope Size: 3 and McGraph Grade View: Grade I Tube type: Oral Tube size: 7.0 mm Number of attempts: 1 Airway Equipment and Method: Stylet and Video-laryngoscopy Placement Confirmation: ETT inserted through vocal cords under direct vision,  positive ETCO2 and breath sounds checked- equal and bilateral Secured at: 21 cm Tube secured with: Tape Dental Injury: Teeth and Oropharynx as per pre-operative assessment

## 2020-01-22 NOTE — Progress Notes (Signed)
Procedure: L3-4 laminectomy Procedure date: 01/22/2020 Diagnosis: neurogenic claudication   History: Sara Carr is s/p L3-4 laminectomy POD: Tolerated procedure well. Evaluated in post op recovery still disoriented from anesthesia but able to answer questions and obey commands.   Physical Exam: Vitals:   01/22/20 1148 01/22/20 1226  BP: (!) 147/78 (!) 146/85  Pulse: 83 88  Resp: 16 17  Temp:    SpO2: 95% 97%    General: Alert and oriented, lying in bed Strength:5/5 throughout  Sensation: intact and symmetric throughout    Data:  Recent Labs  Lab 01/18/20 0839  NA 143  K 4.1  CL 108  CO2 26  BUN 21  CREATININE 0.82  GLUCOSE 142*  CALCIUM 9.2   No results for input(s): AST, ALT, ALKPHOS in the last 168 hours.  Invalid input(s): TBILI   Recent Labs  Lab 01/18/20 0839  WBC 6.3  HGB 12.8  HCT 41.0  PLT 233   Recent Labs  Lab 01/18/20 0839  APTT 33  INR 1.0           Assessment/Plan:  Sara Carr is POD0 s/p L3-4 laminectomy . Will continue to monitor  - mobilize, no PT/OT consult necessary as long as she is ambulating with staff - pain control - DVT prophylaxis SCDs - Brace prn  [name], NP Department of Neurosurgery

## 2020-01-22 NOTE — H&P (Signed)
Sara Carr is an 74 y.o. female.   Chief Complaint: Back and leg pain HPI: [name] is here for evaluation of ongoing symptoms of back and leg pain. She did undergo a lumbar fusion at L4-5 level approximate 10 years ago for back pain and lumbar stenosis. She says she did well from that surgery but she has had back pain off and on since then. She currently states she is having severe low back pain but also pain that goes into the anterior thighs bilaterally. She does feel like this is a dull aching pain that is constant. She does feel like standing up straight and walking can worsen this and she does bend forward to relieve the symptoms. She does not endorse any obvious numbness. She has started physical therapy she does feel like this can be helpful. She did also go for a recent MRI of the lumbar spine which showed stenosis at L3/4 above her fusion. Xrays showed no instability. Given this, a decompression was recommended and she is ready to proceed.   Past Medical History:  Diagnosis Date  . Anemia    as a little gitrl, but not anymore   . Arthritis   . Back pain   . Depression   . Diabetes mellitus without complication ([place])   . GERD (gastroesophageal reflux disease)   . Hyperlipidemia   . Hypertension   . Hypothyroidism   . Thyroid disease     Past Surgical History:  Procedure Laterality Date  . BACK SURGERY    . JOINT REPLACEMENT Bilateral 2017  . REPLACEMENT TOTAL KNEE Bilateral     Family History  Problem Relation Age of Onset  . Stroke Mother   . Heart disease Father   . Diabetes Father   . Hypertension Father    Social History:  reports that she has never smoked. She has never used smokeless tobacco. She reports that she does not drink alcohol or use drugs.  Allergies:  Allergies  Allergen Reactions  . Penicillins Hives and Swelling    Medications Prior to Admission  Medication Sig Dispense Refill  . atorvastatin (LIPITOR) 40 MG tablet Take 1 tablet (40 mg total)  by mouth daily. 90 tablet 1  . carvedilol (COREG) 6.25 MG tablet Take 1 tablet (6.25 mg total) by mouth daily. 90 tablet 1  . diclofenac (VOLTAREN) 50 MG EC tablet Take 1 tablet (50 mg total) by mouth daily. 90 tablet 1  . gabapentin (NEURONTIN) 800 MG tablet Take 800 mg by mouth 2 (two) times daily as needed (pain).     . INVOKAMET XR 150-500 MG TB24 Take 1 tablet by mouth in the morning and at bedtime.    . lansoprazole (PREVACID) 30 MG capsule Take 1 capsule (30 mg total) by mouth daily at 12 noon. 90 capsule 1  . levothyroxine (SYNTHROID) 75 MCG tablet Take 75 mcg by mouth daily before breakfast.    . losartan (COZAAR) 25 MG tablet Take 1 tablet (25 mg total) by mouth daily. 90 tablet 1  . Semaglutide (OZEMPIC, 0.25 OR 0.5 MG/DOSE, [place]) Inject 0.75 mg into the skin once a week.     . sertraline (ZOLOFT) 50 MG tablet Take 1 tablet (50 mg total) by mouth daily. 90 tablet 1  . tolterodine (DETROL LA) 4 MG 24 hr capsule Take 1 capsule (4 mg total) by mouth daily. 90 capsule 1  . triamcinolone cream (KENALOG) 0.1 % Apply 1 application topically 2 (two) times daily. 30 g 0  .  levothyroxine (SYNTHROID) 50 MCG tablet Take 1 tablet (50 mcg total) by mouth daily. (Patient not taking: Reported on 01/10/2020) 90 tablet 1  . oxyCODONE-acetaminophen (PERCOCET) 5-325 MG tablet Take 1 tablet by mouth every 4 (four) hours as needed for severe pain. 20 tablet 0    Results for orders placed or performed during the hospital encounter of 01/22/20 (from the past 48 hour(s))  Glucose, capillary     Status: Abnormal   Collection Time: 01/22/20  6:17 AM  Result Value Ref Range   Glucose-Capillary 135 (H) 70 - 99 mg/dL    Comment: Glucose reference range applies only to samples taken after fasting for at least 8 hours.   Comment 1 Notify RN    No results found.  Review of Systems General ROS: Negative Psychological ROS: Negative Ophthalmic ROS: Negative ENT ROS: Negative Hematological and Lymphatic ROS:  Negative  Endocrine ROS: Negative Respiratory ROS: Negative Cardiovascular ROS: Negative Gastrointestinal ROS: Negative Genito-Urinary ROS: Negative Musculoskeletal ROS: Positive for back pain Neurological ROS: Positive for leg pain, negative for numbness Dermatological ROS: Negative  Blood pressure 135/81, pulse 93, temperature 97.8 F (36.6 C), temperature source Tympanic, resp. rate 16, SpO2 99 %. Physical Exam  General appearance: Alert, cooperative, in no acute distress Head: Normocephalic, atraumatic Eyes: Normal, EOM intact Oropharynx: Wearing facemask CV: Regular rate and rhythm Pulm: Clear to auscultation Back: Well-healed midline incision, no tenderness to palpation Ext: Mild edema noted in bilateral lower extremities  Neurologic exam:  Mental status: alertness: alert, affect: normal Speech: fluent and clear Motor:strength symmetric 5/5 in bilateral hip flexion, knee flexion, knee extension, dorsiflexion, plantarflexion Sensory: intact to light touch in bilateral lower extremities Reflexes: 2+ and symmetric bilaterally for patella Gait: Slow, steady gait  Imaging: MRI lumbar spine: There is evidence of a previous L4-5 fusion. There is adjacent level disease at L3-4 which causes severe central stenosis and compression of the nerve roots at this level. There is a good lordotic curvature. Alignment appears well-maintained. There appears to be a solid fusion at L4-5 with an adequate decompression at this and the L5-S1 level.  Flexion-extension x-rays of lumbar spine: There is no evidence of motion at the L4-5 or L3-4 level.   Assessment/Plan L3/4 Laminectomy  Lucy Chris, MD 01/22/2020, 6:45 AM

## 2020-01-22 NOTE — Anesthesia Postprocedure Evaluation (Signed)
Anesthesia Post Note  Patient: Sara Carr  Procedure(s) Performed: L3-4 laminectomy (N/A )  Patient location during evaluation: PACU Anesthesia Type: General Level of consciousness: awake and alert Pain management: pain level controlled Vital Signs Assessment: post-procedure vital signs reviewed and stable Respiratory status: spontaneous breathing, nonlabored ventilation, respiratory function stable and patient connected to nasal cannula oxygen Cardiovascular status: blood pressure returned to baseline and stable Postop Assessment: no apparent nausea or vomiting Anesthetic complications: no     Last Vitals:  Vitals:   01/22/20 1050 01/22/20 1055  BP:  133/80  Pulse: 86 85  Resp: (!) 22 17  Temp:  (!) 36.2 C  SpO2: 93% 93%    Last Pain:  Vitals:   01/22/20 1055  TempSrc:   PainSc: 0-No pain    LLE Motor Response: Purposeful movement (01/22/20 1055)   RLE Motor Response: Purposeful movement (01/22/20 1055)        Cleda Mccreedy Sharda Keddy

## 2020-01-22 NOTE — Anesthesia Preprocedure Evaluation (Signed)
Anesthesia Evaluation  Patient identified by MRN, date of birth, ID band Patient awake    Reviewed: Allergy & Precautions, H&P , NPO status , Patient's Chart, lab work & pertinent test results  History of Anesthesia Complications Negative for: history of anesthetic complications  Airway Mallampati: II  TM Distance: <3 FB Neck ROM: full    Dental  (+) Chipped   Pulmonary neg pulmonary ROS, neg shortness of breath,    Pulmonary exam normal        Cardiovascular Exercise Tolerance: Good hypertension, (-) angina(-) Past MI and (-) DOE Normal cardiovascular exam     Neuro/Psych negative neurological ROS  negative psych ROS   GI/Hepatic Neg liver ROS, GERD  Medicated and Controlled,  Endo/Other  diabetes, Type 2Hypothyroidism   Renal/GU      Musculoskeletal  (+) Arthritis ,   Abdominal   Peds  Hematology negative hematology ROS (+)   Anesthesia Other Findings Past Medical History: No date: Anemia     Comment:  as a little gitrl, but not anymore  No date: Arthritis No date: Back pain No date: Depression No date: Diabetes mellitus without complication ([facility]) No date: GERD (gastroesophageal reflux disease) No date: Hyperlipidemia No date: Hypertension No date: Hypothyroidism No date: Thyroid disease  Past Surgical History: No date: BACK SURGERY 2017: JOINT REPLACEMENT; Bilateral No date: REPLACEMENT TOTAL KNEE; Bilateral     Reproductive/Obstetrics negative OB ROS                             Anesthesia Physical Anesthesia Plan  ASA: III  Anesthesia Plan: General ETT   Post-op Pain Management:    Induction: Intravenous  PONV Risk Score and Plan: Ondansetron, Dexamethasone, Midazolam and Treatment may vary due to age or medical condition  Airway Management Planned: Oral ETT  Additional Equipment:   Intra-op Plan:   Post-operative Plan: Extubation in OR  Informed  Consent: I have reviewed the patients History and Physical, chart, labs and discussed the procedure including the risks, benefits and alternatives for the proposed anesthesia with the patient or authorized representative who has indicated his/her understanding and acceptance.     Dental Advisory Given  Plan Discussed with: Anesthesiologist, CRNA and Surgeon  Anesthesia Plan Comments: (Patient consented for risks of anesthesia including but not limited to:  - adverse reactions to medications - damage to eyes, teeth, lips or other oral mucosa - nerve damage due to positioning  - sore throat or hoarseness - Damage to heart, brain, nerves, lungs, other parts of body or loss of life  Patient voiced understanding.)        Anesthesia Quick Evaluation

## 2020-01-22 NOTE — Op Note (Signed)
Operative Note  SURGERY DATE:01/22/2020  PRE-OP DIAGNOSIS:Lumbar Stenosis withLumbar Radiculopathy(m48.062) Neurogenic Claudication  POST-OP DIAGNOSIS:Post-Op Diagnosis Codes:    Lumbar Stenosis withLumbar Radiculopathy(m48.062) Neurogenic Claudication   Procedure(s) with comments: 3/4 laminectomy, Lateral recess Decompression   SURGEON:  * Nathaniel Man, MD    [name], PA, Assistant  ANESTHESIA:General  OPERATIVE FINDINGS:Central stenosis at L3/4   OPERATIVE REPORT:   Indication: Ms. Locastro presented to the clinic on 5/25with ongoingback and legleg pain.MRIrevealed stenosis at L3/4 above her prior fusion which was severe.She had been through prescription pain medication and PT without improvement.Lumbardecompressionwas discussed to relieve symptoms.Therisks of surgery were explained to include hematoma, infection, damage to nerve roots, CSF leak, weakness, numbness, pain, need for future surgery including fusion, heart attack, and stroke.Sheelected to proceed with surgery for symptom relief.   Procedure The patient was brought to the OR after informed consent was obtained.She was given general anesthesia and intubated by the anesthesia service. Vascular access lines were placed.The patient was then placed prone on a Wilson frameensuring all pressure points were padded. A time-out was performed per protocol.   The patient was sterilely prepped and draped.the priormidlineincision wasfound and using fluoroscopy to get appropriate level, wasmarked andinstilled withlocal anesthetic with epinephrine. The skin was opened sharply and the dissection taken to the fascia. This was incised and cautery was used to dissect the subperiosteal plane to expose the spinous processes and lamina of L3 and remnant of L4. Retractors were inserted and hemostasis was achieved. X-ray was used to confirm location.  Next, a matchstickdrill bit  was used to remove theentire caudal L3laminaand superiorL4lamina to expose the ligament.We removed the ligament until normal dura seen.The dura was seen to be expanding. There was considerable adherent tissue dorsally as well as synovium which was carefully dissected first along the left lateral recess and then along the right. The lateral recess was decompressed with rongeurs protecting the dura. This was taken caudally to the top of the L4 lamina. Once the dura was free and no stenosis seen laterally, we irrigated the space and gained hemostasis.A blunt instrument ensured no central compression.   The wound was irrigated profusely.Thefasciaand musclewas then closed using 0 vicryl.Next, multiple subcutaneous and dermal layers were closed with 2-0 vicryl until the epidermis was well approximated. The skin was closed withDermabond  The patient was returned to supine position and extubated by the anesthesia service. The patient was then taken to the PACU for post-operative care wherehe was moving extremities symmetrically.   ESTIMATED BLOOD LOSS: 50cc  SPECIMENS None  IMPLANT None   I performed the case in its entiretywith the assistance ofChristine Zdeb, PA,  Lucy Chris, MD [phone]

## 2020-01-23 DIAGNOSIS — E039 Hypothyroidism, unspecified: Secondary | ICD-10-CM | POA: Diagnosis not present

## 2020-01-23 DIAGNOSIS — M1990 Unspecified osteoarthritis, unspecified site: Secondary | ICD-10-CM | POA: Diagnosis not present

## 2020-01-23 DIAGNOSIS — K219 Gastro-esophageal reflux disease without esophagitis: Secondary | ICD-10-CM | POA: Diagnosis not present

## 2020-01-23 DIAGNOSIS — Z981 Arthrodesis status: Secondary | ICD-10-CM | POA: Diagnosis not present

## 2020-01-23 DIAGNOSIS — M48062 Spinal stenosis, lumbar region with neurogenic claudication: Secondary | ICD-10-CM | POA: Diagnosis not present

## 2020-01-23 DIAGNOSIS — M5416 Radiculopathy, lumbar region: Secondary | ICD-10-CM | POA: Diagnosis not present

## 2020-01-23 DIAGNOSIS — I10 Essential (primary) hypertension: Secondary | ICD-10-CM | POA: Diagnosis not present

## 2020-01-23 DIAGNOSIS — F329 Major depressive disorder, single episode, unspecified: Secondary | ICD-10-CM | POA: Diagnosis not present

## 2020-01-23 DIAGNOSIS — E119 Type 2 diabetes mellitus without complications: Secondary | ICD-10-CM | POA: Diagnosis not present

## 2020-01-23 LAB — GLUCOSE, CAPILLARY: Glucose-Capillary: 199 mg/dL — ABNORMAL HIGH (ref 70–99)

## 2020-01-23 MED ORDER — SENNA 8.6 MG PO TABS: 1.0000 | ORAL_TABLET | Freq: Two times a day (BID) | ORAL | 0 refills | Status: DC

## 2020-01-23 MED ORDER — POLYETHYLENE GLYCOL 3350 17 G PO PACK: 17.0000 g | PACK | Freq: Every day | ORAL | 0 refills | Status: DC | PRN

## 2020-01-23 MED ORDER — METHOCARBAMOL 500 MG PO TABS: 500.0000 mg | ORAL_TABLET | Freq: Four times a day (QID) | ORAL | 0 refills | Status: AC | PRN

## 2020-01-23 MED ORDER — ACETAMINOPHEN 325 MG PO TABS: 650.0000 mg | ORAL_TABLET | ORAL | Status: DC | PRN

## 2020-01-23 NOTE — Discharge Summary (Signed)
Procedure: L3-4 laminectomy Procedure date: 01/22/2020 Diagnosis: lumbar stenosis with neurogenic claudication   History: Sara Carr is s/p L3-4 laminectomy POD1: Sara Carr was seen this morning, she has been ambulating and pain is well controlled. She is stable for discharge.   Physical Exam: Vitals:   01/23/20 0406 01/23/20 0803  BP: 123/63 (!) 112/57  Pulse: 71 72  Resp: 18 16  Temp: 98.2 F (36.8 C) 98 F (36.7 C)  SpO2: 97% 93%    General: Alert and oriented, sitting in bed Strength:5/5 throughout  Sensation: intact and symmetric throughout  Skin: incision clean, dry, intact  Data:  Recent Labs  Lab 01/18/20 0839  NA 143  K 4.1  CL 108  CO2 26  BUN 21  CREATININE 0.82  GLUCOSE 142*  CALCIUM 9.2   No results for input(s): AST, ALT, ALKPHOS in the last 168 hours.  Invalid input(s): TBILI   Recent Labs  Lab 01/18/20 0839  WBC 6.3  HGB 12.8  HCT 41.0  PLT 233   Recent Labs  Lab 01/18/20 0839  APTT 33  INR 1.0           Assessment/Plan:  Sara Carr is POD1 s/p L3-4 laminectomy.  She is stable for discharge.  Discharge instructions were given to her and she verbalized understanding of all.   [name], NP Department of Neurosurgery

## 2020-01-23 NOTE — Progress Notes (Signed)
Patient stable and ready for discharge. Writer removed IVsEcologist in with discharge paperwork and went over with patient and husband and both verbalized understanding. Patient's belonging packed by husband and he took them to the car. Writer transported patient via WC to private car.

## 2020-01-23 NOTE — Plan of Care (Signed)

## 2020-01-23 NOTE — Discharge Instructions (Signed)
PLEASE KEEP CLOSE EYE ON YOUR BLOOD SUGARS.   Your surgeon has performed an operation on your lumbar spine (low back) to relieve pressure on one or more nerves. Many times, patients feel better immediately after surgery and can "overdo it." Even if you feel well, it is important that you follow these activity guidelines. If you do not let your back heal properly from the surgery, you can increase the chance of a disc herniation and/or return of your symptoms. The following are instructions to help in your recovery once you have been discharged from the hospital.  * Do not take anti-inflammatory medications for 3 days after surgery (naproxen [Aleve], ibuprofen [Advil, Motrin], celecoxib [Celebrex], etc.)  Activity    No bending, lifting, or twisting ("BLT"). Avoid lifting objects heavier than 10 pounds (gallon milk jug).  Where possible, avoid household activities that involve lifting, bending, pushing, or pulling such as laundry, vacuuming, grocery shopping, and childcare. Try to arrange for help from friends and family for these activities while your back heals.  Increase physical activity slowly as tolerated.  Taking short walks is encouraged, but avoid strenuous exercise. Do not jog, run, bicycle, lift weights, or participate in any other exercises unless specifically allowed by your doctor. Avoid prolonged sitting, including car rides.  Talk to your doctor before resuming sexual activity.  You should not drive until cleared by your doctor.  Until released by your doctor, you should not return to work or school.  You should rest at home and let your body heal.   You may shower two days after your surgery.  After showering, lightly dab your incision dry. Do not take a tub bath or go swimming for 3 weeks, or until approved by your doctor at your follow-up appointment.  If you smoke, we strongly recommend that you quit.  Smoking has been proven to interfere with normal healing in your back and  will dramatically reduce the success rate of your surgery. Please contact [facility] (800-QUIT-NOW) and use the resources at www.[facility].com for assistance in stopping smoking.  Surgical Incision   If you have a dressing on your incision, you may remove it three days after your surgery. Keep your incision area clean and dry.  If you have staples or stitches on your incision, you should have a follow up scheduled for removal. If you do not have staples or stitches, you will have steri-strips (small pieces of surgical tape) or Dermabond glue. The steri-strips/glue should begin to peel away within about a week (it is fine if the steri-strips fall off before then). If the strips are still in place one week after your surgery, you may gently remove them.  Diet            You may return to your usual diet. Be sure to stay hydrated.  When to Contact us  Although your surgery and recovery will likely be uneventful, you may have some residual numbness, aches, and pains in your back and/or legs. This is normal and should improve in the next few weeks.  However, should you experience any of the following, contact us immediately: . New numbness or weakness . Pain that is progressively getting worse, and is not relieved by your pain medications or rest . Bleeding, redness, swelling, pain, or drainage from surgical incision . Chills or flu-like symptoms . Fever greater than 101.0 F (38.3 C) . Problems with bowel or bladder functions . Difficulty breathing or shortness of breath . Warmth, tenderness, or swelling in  your calf  Contact Information . During office hours (Monday-Friday 9 am to 5 pm), please call your physician at [phone] . After hours and weekends, please call [phone] and an answering service will put you in touch with either Dr. Adriana Simas or Dr. Myer Haff.  . For a life-threatening emergency, call 911

## 2020-02-02 ENCOUNTER — Telehealth: Payer: Self-pay | Admitting: Family Medicine

## 2020-02-02 DIAGNOSIS — E063 Autoimmune thyroiditis: Secondary | ICD-10-CM | POA: Diagnosis not present

## 2020-02-02 DIAGNOSIS — E1165 Type 2 diabetes mellitus with hyperglycemia: Secondary | ICD-10-CM | POA: Diagnosis not present

## 2020-02-02 NOTE — Telephone Encounter (Signed)
We did not prescribe this med for her. She may pick up otc. And- we do not know who Elita Quick is? Fayrene Fearing is her husband. Please call Jonika

## 2020-02-02 NOTE — Telephone Encounter (Signed)
I am sorry Elita Quick is the pharmacist  with [facility]

## 2020-02-02 NOTE — Telephone Encounter (Signed)
Sara Carr is calling concerning pt is  allergic to salicylates . Pt was prescribed diclofenac 50 mg back in April now pt is requesting refill. Jose would like callback

## 2020-02-09 DIAGNOSIS — E1165 Type 2 diabetes mellitus with hyperglycemia: Secondary | ICD-10-CM | POA: Diagnosis not present

## 2020-02-09 DIAGNOSIS — E063 Autoimmune thyroiditis: Secondary | ICD-10-CM | POA: Diagnosis not present

## 2020-02-09 DIAGNOSIS — E669 Obesity, unspecified: Secondary | ICD-10-CM | POA: Diagnosis not present

## 2020-02-09 DIAGNOSIS — E782 Mixed hyperlipidemia: Secondary | ICD-10-CM | POA: Diagnosis not present

## 2020-02-09 DIAGNOSIS — I10 Essential (primary) hypertension: Secondary | ICD-10-CM | POA: Diagnosis not present

## 2020-02-13 DIAGNOSIS — B351 Tinea unguium: Secondary | ICD-10-CM | POA: Diagnosis not present

## 2020-02-13 DIAGNOSIS — E1142 Type 2 diabetes mellitus with diabetic polyneuropathy: Secondary | ICD-10-CM | POA: Diagnosis not present

## 2020-03-06 DIAGNOSIS — M2560 Stiffness of unspecified joint, not elsewhere classified: Secondary | ICD-10-CM | POA: Diagnosis not present

## 2020-03-11 DIAGNOSIS — M2560 Stiffness of unspecified joint, not elsewhere classified: Secondary | ICD-10-CM | POA: Diagnosis not present

## 2020-03-13 DIAGNOSIS — M2560 Stiffness of unspecified joint, not elsewhere classified: Secondary | ICD-10-CM | POA: Diagnosis not present

## 2020-03-18 DIAGNOSIS — M2560 Stiffness of unspecified joint, not elsewhere classified: Secondary | ICD-10-CM | POA: Diagnosis not present

## 2020-03-20 DIAGNOSIS — M2560 Stiffness of unspecified joint, not elsewhere classified: Secondary | ICD-10-CM | POA: Diagnosis not present

## 2020-03-25 DIAGNOSIS — M2560 Stiffness of unspecified joint, not elsewhere classified: Secondary | ICD-10-CM | POA: Diagnosis not present

## 2020-03-27 DIAGNOSIS — M2560 Stiffness of unspecified joint, not elsewhere classified: Secondary | ICD-10-CM | POA: Diagnosis not present

## 2020-04-08 DIAGNOSIS — M2560 Stiffness of unspecified joint, not elsewhere classified: Secondary | ICD-10-CM | POA: Diagnosis not present

## 2020-04-10 DIAGNOSIS — M2560 Stiffness of unspecified joint, not elsewhere classified: Secondary | ICD-10-CM | POA: Diagnosis not present

## 2020-04-18 DIAGNOSIS — M2560 Stiffness of unspecified joint, not elsewhere classified: Secondary | ICD-10-CM | POA: Diagnosis not present

## 2020-04-23 DIAGNOSIS — M2560 Stiffness of unspecified joint, not elsewhere classified: Secondary | ICD-10-CM | POA: Diagnosis not present

## 2020-04-25 DIAGNOSIS — M2560 Stiffness of unspecified joint, not elsewhere classified: Secondary | ICD-10-CM | POA: Diagnosis not present

## 2020-05-13 DIAGNOSIS — I10 Essential (primary) hypertension: Secondary | ICD-10-CM | POA: Diagnosis not present

## 2020-05-13 DIAGNOSIS — E782 Mixed hyperlipidemia: Secondary | ICD-10-CM | POA: Diagnosis not present

## 2020-05-13 DIAGNOSIS — E063 Autoimmune thyroiditis: Secondary | ICD-10-CM | POA: Diagnosis not present

## 2020-05-13 DIAGNOSIS — E669 Obesity, unspecified: Secondary | ICD-10-CM | POA: Diagnosis not present

## 2020-05-13 DIAGNOSIS — E1165 Type 2 diabetes mellitus with hyperglycemia: Secondary | ICD-10-CM | POA: Diagnosis not present

## 2020-05-15 DIAGNOSIS — E1142 Type 2 diabetes mellitus with diabetic polyneuropathy: Secondary | ICD-10-CM | POA: Diagnosis not present

## 2020-05-15 DIAGNOSIS — M2560 Stiffness of unspecified joint, not elsewhere classified: Secondary | ICD-10-CM | POA: Diagnosis not present

## 2020-05-15 DIAGNOSIS — B351 Tinea unguium: Secondary | ICD-10-CM | POA: Diagnosis not present

## 2020-05-30 ENCOUNTER — Ambulatory Visit: Payer: Medicare PPO | Admitting: Family Medicine

## 2020-05-30 ENCOUNTER — Encounter: Payer: Self-pay | Admitting: Family Medicine

## 2020-05-30 ENCOUNTER — Other Ambulatory Visit: Payer: Self-pay

## 2020-05-30 VITALS — BP 130/80 | HR 88 | Ht 60.0 in | Wt 190.0 lb

## 2020-05-30 DIAGNOSIS — F324 Major depressive disorder, single episode, in partial remission: Secondary | ICD-10-CM

## 2020-05-30 DIAGNOSIS — E7801 Familial hypercholesterolemia: Secondary | ICD-10-CM

## 2020-05-30 DIAGNOSIS — K219 Gastro-esophageal reflux disease without esophagitis: Secondary | ICD-10-CM

## 2020-05-30 DIAGNOSIS — N3941 Urge incontinence: Secondary | ICD-10-CM

## 2020-05-30 DIAGNOSIS — I10 Essential (primary) hypertension: Secondary | ICD-10-CM

## 2020-05-30 DIAGNOSIS — Z23 Encounter for immunization: Secondary | ICD-10-CM

## 2020-05-30 MED ORDER — OXYBUTYNIN CHLORIDE 5 MG/5ML PO SYRP: 5.0000 mg | ORAL_SOLUTION | Freq: Two times a day (BID) | ORAL | 1 refills | Status: DC

## 2020-05-30 MED ORDER — LOSARTAN POTASSIUM 25 MG PO TABS: 25.0000 mg | ORAL_TABLET | Freq: Every day | ORAL | 1 refills | Status: DC

## 2020-05-30 MED ORDER — LANSOPRAZOLE 30 MG PO CPDR: 30.0000 mg | DELAYED_RELEASE_CAPSULE | Freq: Every day | ORAL | 1 refills | Status: DC

## 2020-05-30 MED ORDER — CARVEDILOL 6.25 MG PO TABS: 6.2500 mg | ORAL_TABLET | Freq: Every day | ORAL | 1 refills | Status: DC

## 2020-05-30 MED ORDER — SERTRALINE HCL 50 MG PO TABS: 50.0000 mg | ORAL_TABLET | Freq: Every day | ORAL | 1 refills | Status: DC

## 2020-05-30 MED ORDER — ATORVASTATIN CALCIUM 40 MG PO TABS: 40.0000 mg | ORAL_TABLET | Freq: Every day | ORAL | 1 refills | Status: DC

## 2020-05-30 NOTE — Progress Notes (Signed)
Date:  05/30/2020   Name:  Sara Carr   DOB:  09-Nov-1945   MRN:  [id number]   Chief Complaint: Hypertension, Hyperlipidemia, Gastroesophageal Reflux, overactive bladder (something in place of detrol LA), Depression, and Flu Vaccine  Hypertension This is a chronic problem. The current episode started in the past 7 days. The problem has been gradually improving since onset. The problem is controlled. Pertinent negatives include no anxiety, blurred vision, chest pain, headaches, malaise/fatigue, neck pain, orthopnea, palpitations, peripheral edema, PND, shortness of breath or sweats. There are no associated agents to hypertension. There are no known risk factors for coronary artery disease. Past treatments include angiotensin blockers, beta blockers and alpha 1 blockers. The current treatment provides moderate improvement. There are no compliance problems.  There is no history of angina, kidney disease, CAD/MI, CVA, heart failure, left ventricular hypertrophy, PVD or retinopathy. There is no history of chronic renal disease, a hypertension causing med or renovascular disease.  Hyperlipidemia This is a chronic problem. The current episode started more than 1 year ago. Recent lipid tests were reviewed and are normal. She has no history of chronic renal disease, diabetes, hypothyroidism, liver disease, obesity or nephrotic syndrome. Pertinent negatives include no chest pain, focal sensory loss, focal weakness, leg pain, myalgias or shortness of breath. She is currently on no antihyperlipidemic treatment. The current treatment provides moderate improvement of lipids. There are no compliance problems.  Risk factors for coronary artery disease include dyslipidemia and hypertension.  Gastroesophageal Reflux She complains of heartburn. She reports no abdominal pain, no belching, no chest pain, no choking, no coughing, no dysphagia, no early satiety, no globus sensation, no hoarse voice, no nausea, no sore  throat, no stridor, no tooth decay, no water brash or no wheezing. This is a chronic problem. The current episode started more than 1 year ago. The problem occurs occasionally. The problem has been gradually worsening. Pertinent negatives include no fatigue.  Depression        Associated symptoms include no fatigue, no myalgias and no headaches.   Pertinent negatives include no hypothyroidism and no anxiety.   Lab Results  Component Value Date   CREATININE 0.82 01/18/2020   BUN 21 01/18/2020   NA 143 01/18/2020   K 4.1 01/18/2020   CL 108 01/18/2020   CO2 26 01/18/2020   Lab Results  Component Value Date   CHOL 124 05/20/2018   HDL 35 05/20/2018   LDLCALC 64 05/20/2018   TRIG 176 (A) 05/20/2018   No results found for: TSH Lab Results  Component Value Date   HGBA1C 8.5 (H) 01/22/2020   Lab Results  Component Value Date   WBC 6.3 01/18/2020   HGB 12.8 01/18/2020   HCT 41.0 01/18/2020   MCV 83.5 01/18/2020   PLT 233 01/18/2020   Lab Results  Component Value Date   ALT 16 05/20/2018   AST 16 05/20/2018     Review of Systems  Constitutional: Negative.  Negative for chills, fatigue, fever, malaise/fatigue and unexpected weight change.  HENT: Negative for congestion, ear discharge, ear pain, hoarse voice, rhinorrhea, sinus pressure, sneezing and sore throat.   Eyes: Negative for blurred vision, photophobia, pain, discharge, redness and itching.  Respiratory: Negative for cough, choking, shortness of breath, wheezing and stridor.   Cardiovascular: Negative for chest pain, palpitations, orthopnea and PND.  Gastrointestinal: Positive for heartburn. Negative for abdominal pain, blood in stool, constipation, diarrhea, dysphagia, nausea and vomiting.  Endocrine: Negative for cold intolerance, heat intolerance, polydipsia,  polyphagia and polyuria.  Genitourinary: Negative for dysuria, flank pain, frequency, hematuria, menstrual problem, pelvic pain, urgency, vaginal bleeding and  vaginal discharge.  Musculoskeletal: Negative for arthralgias, back pain, myalgias and neck pain.  Skin: Negative for rash.  Allergic/Immunologic: Negative for environmental allergies and food allergies.  Neurological: Negative for dizziness, focal weakness, weakness, light-headedness, numbness and headaches.  Hematological: Negative for adenopathy. Does not bruise/bleed easily.  Psychiatric/Behavioral: Positive for depression. Negative for dysphoric mood. The patient is not nervous/anxious.     Patient Active Problem List   Diagnosis Date Noted   Lumbar radiculopathy 01/22/2020    Allergies  Allergen Reactions   Penicillins Hives and Swelling    Past Surgical History:  Procedure Laterality Date   BACK SURGERY     JOINT REPLACEMENT Bilateral 2017   LUMBAR LAMINECTOMY/ DECOMPRESSION WITH MET-RX N/A 01/22/2020   Procedure: L3-4 laminectomy;  Surgeon: [name], MD;  Location: [facility] ORS;  Service: Neurosurgery;  Laterality: N/A;   REPLACEMENT TOTAL KNEE Bilateral     Social History   Tobacco Use   Smoking status: Never Smoker   Smokeless tobacco: Never Used  Substance Use Topics   Alcohol use: Never   Drug use: Never     Medication list has been reviewed and updated.  Current Meds  Medication Sig   acetaminophen (TYLENOL) 325 MG tablet Take 2 tablets (650 mg total) by mouth every 4 (four) hours as needed for mild pain ((score 1 to 3) or temp > 100.5).   atorvastatin (LIPITOR) 40 MG tablet Take 1 tablet (40 mg total) by mouth daily.   carvedilol (COREG) 6.25 MG tablet Take 1 tablet (6.25 mg total) by mouth daily.   gabapentin (NEURONTIN) 800 MG tablet Take 800 mg by mouth 2 (two) times daily as needed (pain). [place]   INVOKAMET XR 150-500 MG TB24 Take 1 tablet by mouth in the morning and at bedtime.   lansoprazole (PREVACID) 30 MG capsule Take 1 capsule (30 mg total) by mouth daily at 12 noon.   levothyroxine (SYNTHROID) 75 MCG tablet Take 75 mcg by  mouth daily before breakfast.   losartan (COZAAR) 25 MG tablet Take 1 tablet (25 mg total) by mouth daily.   polyethylene glycol (MIRALAX / GLYCOLAX) 17 g packet Take 17 g by mouth daily as needed for mild constipation.   Semaglutide (OZEMPIC, 0.25 OR 0.5 MG/DOSE, [place]) Inject 0.75 mg into the skin once a week.    senna (SENOKOT) 8.6 MG TABS tablet Take 1 tablet (8.6 mg total) by mouth 2 (two) times daily.   sertraline (ZOLOFT) 50 MG tablet Take 1 tablet (50 mg total) by mouth daily.   tolterodine (DETROL LA) 4 MG 24 hr capsule Take 1 capsule (4 mg total) by mouth daily.   triamcinolone cream (KENALOG) 0.1 % Apply 1 application topically 2 (two) times daily.    PHQ 2/9 Scores 05/30/2020 11/28/2019 11/23/2019 06/27/2019  PHQ - 2 Score 0 _0 PHQ- 9 Score 0 _1 GAD 7 : Generalized Anxiety Score 05/30/2020 11/28/2019 11/23/2019 06/27/2019  Nervous, Anxious, on Edge 0 0 0 0  Control/stop worrying 0 0 0 0  Worry too much - different things 0 0 0 2  Trouble relaxing 0 0 0 0  Restless 0 0 0 0  Easily annoyed or irritable 0 0 3 2  Afraid - awful might happen 0 0 0 0  Total GAD 7 Score 0 0 3 4  Anxiety Difficulty - -  Not difficult at all Not difficult at all    BP Readings from Last 3 Encounters:  05/30/20 130/80  01/23/20 (!) 112/57  11/29/19 110/60    Physical Exam Vitals and nursing note reviewed.  Constitutional:      Appearance: She is well-developed.  HENT:     Head: Normocephalic.     Right Ear: Tympanic membrane, ear canal and external ear normal.     Left Ear: Tympanic membrane, ear canal and external ear normal.     Nose: Nose normal.  Eyes:     General: Lids are everted, no foreign bodies appreciated. No scleral icterus.       Left eye: No foreign body or hordeolum.     Conjunctiva/sclera: Conjunctivae normal.     Right eye: Right conjunctiva is not injected.     Left eye: Left conjunctiva is not injected.     Pupils: Pupils are equal, round, and reactive to  light.  Neck:     Thyroid: No thyromegaly.     Vascular: No JVD.     Trachea: No tracheal deviation.  Cardiovascular:     Rate and Rhythm: Normal rate and regular rhythm.     Pulses: Normal pulses.     Heart sounds: Normal heart sounds. No murmur heard.  No friction rub. No gallop.   Pulmonary:     Effort: Pulmonary effort is normal. No respiratory distress.     Breath sounds: Normal breath sounds. No wheezing, rhonchi or rales.  Abdominal:     General: Bowel sounds are normal.     Palpations: Abdomen is soft. There is no mass.     Tenderness: There is no abdominal tenderness. There is no guarding or rebound.  Musculoskeletal:        General: No tenderness. Normal range of motion.     Cervical back: Normal range of motion and neck supple.  Lymphadenopathy:     Cervical: No cervical adenopathy.  Skin:    General: Skin is warm.     Findings: No rash.  Neurological:     Mental Status: She is alert and oriented to person, place, and time.     Cranial Nerves: No cranial nerve deficit.     Deep Tendon Reflexes: Reflexes normal.  Psychiatric:        Mood and Affect: Mood is not anxious or depressed.     Wt Readings from Last 3 Encounters:  05/30/20 190 lb (86.2 kg)  01/17/20 182 lb (82.6 kg)  11/29/19 183 lb (83 kg)    BP 130/80    Pulse 88    Ht 5' (1.524 m)    Wt 190 lb (86.2 kg)    BMI 37.11 kg/m   Assessment and Plan: 1. Essential hypertension Chronic.  Controlled.  Stable.  Blood pressure is 130/80.  Continue losartan 5 mg once a day.  Review of CMP that was in June of this year was acceptable. - losartan (COZAAR) 25 MG tablet; Take 1 tablet (25 mg total) by mouth daily.  Dispense: 90 tablet; Refill: 1  2. Familial hypercholesterolemia .  Controlled.  Stable.  Continue atorvastatin 40 mg once a day.  Will recheck lipid panel in 6 months. - atorvastatin (LIPITOR) 40 MG tablet; Take 1 tablet (40 mg total) by mouth daily.  Dispense: 90 tablet; Refill: 1  3.  Gastroesophageal reflux disease without esophagitis Chronic.  Controlled.  Stable.  Continue Prevacid 30 mg once a day. - lansoprazole (PREVACID) 30 MG capsule; Take 1 capsule (  30 mg total) by mouth daily at 12 noon.  Dispense: 90 capsule; Refill: 1  4. Major depressive disorder in partial remission, unspecified whether recurrent (HCC) Chronic.  Controlled.  Stable.  PHQ 0 gad score 0 we will continue sertraline 50 mg once a day. - sertraline (ZOLOFT) 50 MG tablet; Take 1 tablet (50 mg total) by mouth daily.  Dispense: 90 tablet; Refill: 1  5. Urge incontinence Patient relates that her Detrol LA that she is taking in the past is over $300 prescription.  We will try oxybutynin 5 mg per 5 mL syrup to take 1/2 to 1 teaspoon twice a day. - oxybutynin (DITROPAN) 5 MG/5ML syrup; Take 5 mLs (5 mg total) by mouth 2 (two) times daily.  Dispense: 120 mL; Refill: 1  6. Need for immunization against influenza Discussed and administered - Flu Vaccine QUAD High Dose(Fluad)

## 2020-06-10 ENCOUNTER — Other Ambulatory Visit (HOSPITAL_BASED_OUTPATIENT_CLINIC_OR_DEPARTMENT_OTHER): Payer: Self-pay | Admitting: Internal Medicine

## 2020-06-10 ENCOUNTER — Ambulatory Visit: Payer: Medicare PPO | Attending: Internal Medicine

## 2020-06-10 DIAGNOSIS — Z23 Encounter for immunization: Secondary | ICD-10-CM

## 2020-06-10 NOTE — Progress Notes (Signed)
° °  Covid-19 Vaccination Clinic  Name:  Kiyara Bouffard    MRN: [id number] DOB: February 23, 1946  06/10/2020  Ms. Hogan was observed post Covid-19 immunization for 15 minutes without incident. She was provided with Vaccine Information Sheet and instruction to access the V-Safe system. Vaccinated by Nicole Kindred.  Ms. Rayson was instructed to call 911 with any severe reactions post vaccine:  Difficulty breathing   Swelling of face and throat   A fast heartbeat   A bad rash all over body   Dizziness and weakness

## 2020-06-12 ENCOUNTER — Other Ambulatory Visit: Payer: Self-pay | Admitting: Family Medicine

## 2020-06-12 NOTE — Telephone Encounter (Signed)
Medication Refill - Medication: levothyroxine (SYNTHROID) 75 MCG tablet     Preferred Pharmacy (with phone number or street name):  [facility] - [place], [place] - [address] Phone:  [phone]  Fax:  [phone]       Agent: Please be advised that RX refills may take up to 3 business days. We ask that you follow-up with your pharmacy.  

## 2020-06-12 NOTE — Telephone Encounter (Signed)
Requested medication (s) are due for refill today: yes  Requested medication (s) are on the active medication list: yes  Last refill:  ?  Future visit scheduled: yes  Notes to clinic: historical provider    Requested Prescriptions  Pending Prescriptions Disp Refills   levothyroxine (SYNTHROID) 75 MCG tablet      Sig: Take 1 tablet (75 mcg total) by mouth daily before breakfast.      Endocrinology:  Hypothyroid Agents Failed - 06/12/2020 10:47 AM      Failed - TSH needs to be rechecked within 3 months after an abnormal result. Refill until TSH is due.      Failed - TSH in normal range and within 360 days    No results found for: TSH, POCTSH, TSHREFLEX        Passed - Valid encounter within last 12 months    Recent Outpatient Visits           1 week ago Essential hypertension   [facility] Duanne Limerick, MD   6 months ago Essential hypertension   [facility] Duanne Limerick, MD   6 months ago Urinary frequency   [facility] Duanne Limerick, MD   11 months ago Establishing care with new doctor, encounter for   [facility] Duanne Limerick, MD       Future Appointments             In 5 months Duanne Limerick, MD [facility], [facility]

## 2020-06-13 ENCOUNTER — Other Ambulatory Visit: Payer: Self-pay | Admitting: Family Medicine

## 2020-06-13 NOTE — Telephone Encounter (Signed)
Requested medication (s) are due for refill today - yes  Requested medication (s) are on the active medication list -yes  Future visit scheduled -yes  Last refill: 5 months ago  Notes to clinic: Pt has been having trouble with getting this med so [employer]([facility] is reaching out. This med has been taken by pt for quite a while but the last provider was in Orthopedics, maybe why is rejecting. Pt is wanting refills to be submitted by PCP per [facility]. Please refill or reachout. Patient does not have current labs for this medication in chart  Requested Prescriptions  Pending Prescriptions Disp Refills   levothyroxine (SYNTHROID) 75 MCG tablet      Sig: Take 1 tablet (75 mcg total) by mouth daily before breakfast.      Endocrinology:  Hypothyroid Agents Failed - 06/13/2020  3:15 PM      Failed - TSH needs to be rechecked within 3 months after an abnormal result. Refill until TSH is due.      Failed - TSH in normal range and within 360 days    No results found for: TSH, POCTSH, TSHREFLEX        Passed - Valid encounter within last 12 months    Recent Outpatient Visits           2 weeks ago Essential hypertension   [facility] Duanne Limerick, MD   6 months ago Essential hypertension   [facility] Duanne Limerick, MD   6 months ago Urinary frequency   [facility] Duanne Limerick, MD   11 months ago Establishing care with new doctor, encounter for   [facility] Duanne Limerick, MD       Future Appointments             In 5 months Duanne Limerick, MD [facility], [facility]                Requested Prescriptions  Pending Prescriptions Disp Refills   levothyroxine (SYNTHROID) 75 MCG tablet      Sig: Take 1 tablet (75 mcg total) by mouth daily before breakfast.      Endocrinology:  Hypothyroid Agents Failed - 06/13/2020  3:15 PM      Failed - TSH needs to be rechecked within 3 months after an abnormal result. Refill until TSH is  due.      Failed - TSH in normal range and within 360 days    No results found for: TSH, POCTSH, TSHREFLEX        Passed - Valid encounter within last 12 months    Recent Outpatient Visits           2 weeks ago Essential hypertension   [facility] Duanne Limerick, MD   6 months ago Essential hypertension   [facility] Duanne Limerick, MD   6 months ago Urinary frequency   [facility] Duanne Limerick, MD   11 months ago Establishing care with new doctor, encounter for   [facility] Duanne Limerick, MD       Future Appointments             In 5 months Duanne Limerick, MD [facility], [facility]

## 2020-06-13 NOTE — Telephone Encounter (Signed)
levothyroxine (SYNTHROID) 75 MCG tablet Medication Date: 01/10/2020 Department: [facility] ORTHOPEDICS (1A) Ordering: Alphonzo Dublin, CPhT Authorizing: [provider]   Pt has been having trouble with getting this med so [employer]([facility] is reaching out. This med has been taken by pt for quite a while but the last provider was in Orthopedics, maybe why is rejecting. Pt is wanting refills to be submitted by PCP per [facility]. Please refill or reachout.  [facility] - [place], [place] - [address] Phone:  [phone]  Fax:  [phone]

## 2020-06-14 MED FILL — PFIZER-BIONTECH COVID-19 VA: 30 | 1 days supply | Qty: 0 | Fill #0

## 2020-07-25 ENCOUNTER — Other Ambulatory Visit: Payer: Medicare PPO

## 2020-07-25 DIAGNOSIS — Z20822 Contact with and (suspected) exposure to covid-19: Secondary | ICD-10-CM

## 2020-07-26 LAB — NOVEL CORONAVIRUS, NAA: SARS-CoV-2, NAA: NOT DETECTED

## 2020-07-26 LAB — SARS-COV-2, NAA 2 DAY TAT

## 2020-08-07 ENCOUNTER — Ambulatory Visit (INDEPENDENT_AMBULATORY_CARE_PROVIDER_SITE_OTHER): Payer: Medicare PPO

## 2020-08-07 ENCOUNTER — Telehealth: Payer: Self-pay

## 2020-08-07 ENCOUNTER — Other Ambulatory Visit: Payer: Self-pay

## 2020-08-07 VITALS — BP 122/80 | HR 102 | Temp 98.1°F | Resp 16 | Ht 60.0 in | Wt 184.8 lb

## 2020-08-07 DIAGNOSIS — I10 Essential (primary) hypertension: Secondary | ICD-10-CM

## 2020-08-07 DIAGNOSIS — Z780 Asymptomatic menopausal state: Secondary | ICD-10-CM

## 2020-08-07 DIAGNOSIS — Z0000 Encounter for general adult medical examination without abnormal findings: Secondary | ICD-10-CM

## 2020-08-07 DIAGNOSIS — Z1231 Encounter for screening mammogram for malignant neoplasm of breast: Secondary | ICD-10-CM | POA: Diagnosis not present

## 2020-08-07 DIAGNOSIS — E7801 Familial hypercholesterolemia: Secondary | ICD-10-CM | POA: Diagnosis not present

## 2020-08-07 DIAGNOSIS — E1140 Type 2 diabetes mellitus with diabetic neuropathy, unspecified: Secondary | ICD-10-CM | POA: Diagnosis not present

## 2020-08-07 NOTE — Chronic Care Management (AMB) (Signed)
Chronic Care Management   Note  08/07/2020 Name: Sara Carr MRN: [id number] DOB: 1946-08-10  Sara Carr is a 74 y.o. year old female who is a primary care patient of Juline Patch, MD. I reached out to [facility] by phone today in response to a referral sent by [name]'s patient's AWV (annual wellness visit) nurse, Clemetine Marker.      Sara Carr was given information about Chronic Care Management services today including:  1. CCM service includes personalized support from designated clinical staff supervised by her physician, including individualized plan of care and coordination with other care providers 2. 24/7 contact phone numbers for assistance for urgent and routine care needs. 3. Service will only be billed when office clinical staff spend 20 minutes or more in a month to coordinate care. 4. Only one practitioner may furnish and bill the service in a calendar month. 5. The patient may stop CCM services at any time (effective at the end of the month) by phone call to the office staff. 6. The patient will be responsible for cost sharing (co-pay) of up to 20% of the service fee (after annual deductible is met).  Patient agreed to services and verbal consent obtained.   Follow up plan: Telephone appointment with care management team member scheduled for:08/13/2020  Noreene Larsson, [place], [place], [place] [address] Direct Dial: [phone] [email or web address]: [email or web address]

## 2020-08-07 NOTE — Progress Notes (Signed)
Subjective:   Sara Carr is a 74 y.o. female who presents for an Initial Medicare Annual Wellness Visit.  Review of Systems     Cardiac Risk Factors include: advanced age (>18mn, >>29women);diabetes mellitus;dyslipidemia;hypertension;obesity (BMI >30kg/m2)     Objective:    Today's Vitals   08/07/20 1403  BP: 122/80  Pulse: (!) 102  Resp: 16  Temp: 98.1 F (36.7 C)  TempSrc: Oral  SpO2: 98%  Weight: 184 lb 12.8 oz (83.8 kg)  Height: 5' (1.524 m)   Body mass index is 36.09 kg/m.  Advanced Directives 08/07/2020 01/22/2020 01/22/2020 01/17/2020 11/29/2019  Does Patient Have a Medical Advance Directive? Yes Yes - Yes No  Type of Advance [place]ive [place] will [place] will Living will -  Does patient want to make changes to medical advance directive? - No - Patient declined - - -  Copy of [place] Chart? Yes - validated most recent copy scanned in chart (See row information) Yes - validated most recent copy scanned in chart (See row information) Yes - validated most recent copy scanned in chart (See row information) - -  Would patient like information on creating a medical advance directive? - - - - No - Patient declined    Current Medications (verified) Outpatient Encounter Medications as of 08/07/2020  Medication Sig  . acetaminophen (TYLENOL) 325 MG tablet Take 2 tablets (650 mg total) by mouth every 4 (four) hours as needed for mild pain ((score 1 to 3) or temp > 100.5).  .Marland Kitchenatorvastatin (LIPITOR) 40 MG tablet Take 1 tablet (40 mg total) by mouth daily.  . carvedilol (COREG) 6.25 MG tablet Take 1 tablet (6.25 mg total) by mouth daily.  . Continuous Blood Gluc Sensor (FREESTYLE LIBRE 14 DAY SENSOR) MISC   . gabapentin (NEURONTIN) 800 MG tablet Take 800 mg by mouth 2 (two) times daily as needed (pain). [place]  . INVOKAMET XR 150-500 MG TB24 Take 1 tablet by  mouth in the morning and at bedtime.  . lansoprazole (PREVACID) 30 MG capsule Take 1 capsule (30 mg total) by mouth daily at 12 noon.  .Marland Kitchenlevothyroxine (SYNTHROID) 75 MCG tablet Take 75 mcg by mouth daily before breakfast.  . losartan (COZAAR) 25 MG tablet Take 1 tablet (25 mg total) by mouth daily.  .Marland Kitchenoxybutynin (DITROPAN) 5 MG/5ML syrup Take 5 mLs (5 mg total) by mouth 2 (two) times daily.  . Semaglutide (OZEMPIC, 0.25 OR 0.5 MG/DOSE, [place]) Inject 0.75 mg into the skin once a week.   . sertraline (ZOLOFT) 50 MG tablet Take 1 tablet (50 mg total) by mouth daily.  .Marland Kitchentolterodine (DETROL LA) 4 MG 24 hr capsule Take 1 capsule (4 mg total) by mouth daily.  .Marland Kitchentriamcinolone cream (KENALOG) 0.1 % Apply 1 application topically 2 (two) times daily. (Patient not taking: Reported on 08/07/2020)  . [DISCONTINUED] polyethylene glycol (MIRALAX / GLYCOLAX) 17 g packet Take 17 g by mouth daily as needed for mild constipation.  . [DISCONTINUED] senna (SENOKOT) 8.6 MG TABS tablet Take 1 tablet (8.6 mg total) by mouth 2 (two) times daily.   No facility-administered encounter medications on file as of 08/07/2020.    Allergies (verified) Penicillins   History: Past Medical History:  Diagnosis Date  . Allergy   . Anemia    as a little gitrl, but not anymore   . Arthritis   . Back pain   . Depression   .  Diabetes mellitus without complication ([place])   . GERD (gastroesophageal reflux disease)   . Hyperlipidemia   . Hypertension   . Hypothyroidism   . Thyroid disease    Past Surgical History:  Procedure Laterality Date  . BACK SURGERY    . CHOLECYSTECTOMY  1979  . JOINT REPLACEMENT Bilateral 2017  . LUMBAR LAMINECTOMY/ DECOMPRESSION WITH MET-RX N/A 01/22/2020   Procedure: L3-4 laminectomy;  Surgeon: [name], MD;  Location: [facility] ORS;  Service: Neurosurgery;  Laterality: N/A;  . REPLACEMENT TOTAL KNEE Bilateral   . SPINE SURGERY  2004   Family History  Problem Relation Age of Onset  . Stroke Mother    . Heart disease Father   . Diabetes Father   . Hypertension Father   . Drug abuse Daughter   . Early death Brother    Social History   Socioeconomic History  . Marital status: Married    Spouse name: Not on file  . Number of children: 1  . Years of education: Not on file  . Highest education level: Not on file  Occupational History  . Not on file  Tobacco Use  . Smoking status: Never Smoker  . Smokeless tobacco: Never Used  . Tobacco comment: none  Vaping Use  . Vaping Use: Never used  Substance and Sexual Activity  . Alcohol use: Never  . Drug use: Never  . Sexual activity: Not Currently    Birth control/protection: None  Other Topics Concern  . Not on file  Social History Narrative  . Not on file   Social Determinants of Health   Financial Resource Strain: Low Risk   . Difficulty of Paying Living Expenses: Not hard at all  Food Insecurity: No Food Insecurity  . Worried About Charity fundraiser in the Last Year: Never true  . Ran Out of Food in the Last Year: Never true  Transportation Needs: No Transportation Needs  . Lack of Transportation (Medical): No  . Lack of Transportation (Non-Medical): No  Physical Activity: Sufficiently Active  . Days of Exercise per Week: 7 days  . Minutes of Exercise per Session: 30 min  Stress: No Stress Concern Present  . Feeling of Stress : Not at all  Social Connections: Moderately Isolated  . Frequency of Communication with Friends and Family: More than three times a week  . Frequency of Social Gatherings with Friends and Family: More than three times a week  . Attends Religious Services: Never  . Active Member of Clubs or Organizations: No  . Attends Archivist Meetings: Never  . Marital Status: Married    Tobacco Counseling Counseling given: Not Answered Comment: none   Clinical Intake:  Pre-visit preparation completed: Yes  Pain : 0-10 Pain Type: Chronic pain Pain Location: Back (while ambulating  only) Pain Orientation: Lower Pain Descriptors / Indicators: Aching,Sore Pain Onset: More than a month ago Pain Frequency: Intermittent     BMI - recorded: 36.09 Nutritional Status: BMI > 30  Obese Nutritional Risks: None Diabetes: Yes CBG done?: No Did pt. bring in CBG monitor from home?: No  How often do you need to have someone help you when you read instructions, pamphlets, or other written materials from your doctor or pharmacy?: 1 - Never  Nutrition Risk Assessment:  Has the patient had any N/V/D within the last 2 months?  No  Does the patient have any non-healing wounds?  No  Has the patient had any unintentional weight loss or weight gain?  No   Diabetes:  Is the patient diabetic?  Yes  If diabetic, was a CBG obtained today?  No  Did the patient bring in their glucometer from home?  No  How often do you monitor your CBG's? Up to 4 times daily with freestyle.   Financial Strains and Diabetes Management:  Are you having any financial strains with the device, your supplies or your medication? Yes .  Does the patient want to be seen by Chronic Care Management for management of their diabetes?  Yes  Would the patient like to be referred to a Nutritionist or for Diabetic Management?  No   Diabetic Exams:  Diabetic Eye Exam: Completed per patient; need records.   Diabetic Foot Exam: Completed 05/15/20.    Interpreter Needed?: No  Information entered by :: Clemetine Marker LPN   Activities of Daily Living In your present state of health, do you have any difficulty performing the following activities: 08/07/2020 01/22/2020  Hearing? N N  Comment declines hearing aids -  Vision? Y N  Comment - glasses  Difficulty concentrating or making decisions? N N  Comment - -  Walking or climbing stairs? N Y  Comment - -  Dressing or bathing? N N  Doing errands, shopping? N N  Preparing Food and eating ? N -  Using the Toilet? N -  In the past six months, have you accidently  leaked urine? Y -  Do you have problems with loss of bowel control? N -  Managing your Medications? N -  Managing your Finances? N -  Housekeeping or managing your Housekeeping? N -  Some recent data might be hidden    Patient Care Team: Juline Patch, MD as PCP - General (Family Medicine) Warnell Forester, NP as Nurse Practitioner (Endocrinology) Elvina Mattes, Rodman Key, DPM as Attending Physician (Podiatry)  Indicate any recent Medical Services you may have received from other than [facility] providers in the past year (date may be approximate).     Assessment:   This is a routine wellness examination for Pernell.  Hearing/Vision screen  Hearing Screening   125Hz  250Hz  500Hz  1000Hz  2000Hz  3000Hz  4000Hz  6000Hz  8000Hz   Right ear:           Left ear:           Comments: Pt denies hearing difficulty  Vision Screening Comments: Annual vision screenings done by eye provider in [place]; pt does not recall name of dr  Dietary issues and exercise activities discussed: Current Exercise Habits: Home exercise routine, Type of exercise: walking, Time (Minutes): 30, Frequency (Times/Week): 7, Weekly Exercise (Minutes/Week): 210, Intensity: Mild, Exercise limited by: orthopedic condition(s)  Goals   None    Depression Screen PHQ 2/9 Scores 08/07/2020 05/30/2020 11/28/2019 11/23/2019 06/27/2019  PHQ - 2 Score 0 0 6 6 4   PHQ- 9 Score - 0 8 9 9     Fall Risk Fall Risk  08/07/2020 05/30/2020 11/28/2019 11/23/2019  Falls in the past year? 0 0 0 1  Number falls in past yr: 0 - - 0  Injury with Fall? 0 - - 0  Risk for fall due to : No Fall Risks - - -  Follow up Falls prevention discussed Falls evaluation completed Falls evaluation completed Falls evaluation completed    [place]:  Any stairs in or around the home? No  If so, are there any without handrails? No  Home free of loose throw rugs in walkways, pet beds, electrical cords,  etc? Yes  Adequate lighting in  your home to reduce risk of falls? Yes   ASSISTIVE DEVICES UTILIZED TO PREVENT FALLS:  Life alert? No  Use of a cane, walker or w/c? Yes  Grab bars in the bathroom? No  Shower chair or bench in shower? No  Elevated toilet seat or a handicapped toilet? No   TIMED UP AND GO:  Was the test performed? Yes .  Length of time to ambulate 10 feet: 5 sec.   Gait steady and fast without use of assistive device  Cognitive Function:     6CIT Screen 08/07/2020  What Year? 0 points  What month? 0 points  What time? 0 points  Count back from 20 0 points  Months in reverse 4 points  Repeat phrase 4 points  Total Score 8    Immunizations Immunization History  Administered Date(s) Administered  . Fluad Quad(high Dose 65+) 06/27/2019, 05/30/2020  . Influenza-Unspecified 10/14/2006, 07/13/2016  . PFIZER SARS-COV-2 Vaccination 08/29/2019, 09/16/2019, 06/10/2020  . Pneumococcal Conjugate-13 06/27/2019  . Pneumococcal Polysaccharide-23 07/13/2016  . Tdap 11/16/2015  . Zoster 02/27/2011    TDAP status: Up to date  Flu Vaccine status: Up to date  Pneumococcal vaccine status: Up to date  Covid-19 vaccine status: Completed vaccines  Qualifies for Shingles Vaccine? Yes   Zostavax completed Yes   Shingrix Completed?: No.    Education has been provided regarding the importance of this vaccine. Patient has been advised to call insurance company to determine out of pocket expense if they have not yet received this vaccine. Advised may also receive vaccine at local pharmacy or Health Dept. Verbalized acceptance and understanding.  Screening Tests Health Maintenance  Topic Date Due  . Hepatitis C Screening  Never done  . DEXA SCAN  Never done  . TETANUS/TDAP  11/15/2025  . INFLUENZA VACCINE  Completed  . COVID-19 Vaccine  Completed  . PNA vac Low Risk Adult  Completed    Health Maintenance  Health Maintenance Due  Topic Date Due  . Hepatitis C Screening  Never done  . DEXA SCAN   Never done    Colorectal cancer screening: Type of screening: Cologuard. Completed 03/18/19. Repeat every 3 years   Mammogram status: Ordered today. Pt provided with contact info and advised to call to schedule appt.   Bone Density status: Ordered today. Pt provided with contact info and advised to call to schedule appt.  Lung Cancer Screening: (Low Dose CT Chest recommended if Age 4-80 years, 30 pack-year currently smoking OR have quit w/in 15years.) does not qualify.   Additional Screening:  Hepatitis C Screening: does qualify; postponed  Vision Screening: Recommended annual ophthalmology exams for early detection of glaucoma and other disorders of the eye. Is the patient up to date with their annual eye exam?  Yes  Who is the provider or what is the name of the office in which the patient attends annual eye exams? Eye provider in [place]: Recommended annual dental exams for proper oral hygiene  Community Resource Referral / Chronic Care Management: CRR required this visit?  No   CCM required this visit?  Yes      Plan:     I have personally reviewed and noted the following in the patient's chart:   . Medical and social history . Use of alcohol, tobacco or illicit drugs  . Current medications and supplements . Functional ability and status . Nutritional status . Physical activity . Advanced directives .  List of other physicians . Hospitalizations, surgeries, and ER visits in previous 12 months . Vitals . Screenings to include cognitive, depression, and falls . Referrals and appointments  In addition, I have reviewed and discussed with patient certain preventive protocols, quality metrics, and best practice recommendations. A written personalized care plan for preventive services as well as general preventive health recommendations were provided to patient.     Clemetine Marker, LPN   [id number]/05/[id number]   Nurse Notes: none

## 2020-08-07 NOTE — Patient Instructions (Signed)
Sara Carr , Thank you for taking time to come for your Medicare Wellness Visit. I appreciate your ongoing commitment to your health goals. Please review the following plan we discussed and let me know if I can assist you in the future.   Screening recommendations/referrals: Colonoscopy: Cologuard done 03/18/19 Mammogram: Please call [phone] to schedule your mammogram and bone density screening.  Bone Density: due Recommended yearly ophthalmology/optometry visit for glaucoma screening and checkup Recommended yearly dental visit for hygiene and checkup  Vaccinations: Influenza vaccine: done 05/30/20 Pneumococcal vaccine: done 06/27/19 Tdap vaccine: done 11/16/15 Shingles vaccine: Shingrix discussed. Please contact your pharmacy for coverage information.   Covid-19: done 08/29/19, 09/16/19 & 06/10/20  Conditions/risks identified: Recommend lowering A1c with healthy eating and physical activity   Next appointment: Follow up in one year for your annual wellness visit    Preventive Care 65 Years and Older, Female Preventive care refers to lifestyle choices and visits with your health care provider that can promote health and wellness. What does preventive care include?  A yearly physical exam. This is also called an annual well check.  Dental exams once or twice a year.  Routine eye exams. Ask your health care provider how often you should have your eyes checked.  Personal lifestyle choices, including:  Daily care of your teeth and gums.  Regular physical activity.  Eating a healthy diet.  Avoiding tobacco and drug use.  Limiting alcohol use.  Practicing safe sex.  Taking low-dose aspirin every day.  Taking vitamin and mineral supplements as recommended by your health care provider. What happens during an annual well check? The services and screenings done by your health care provider during your annual well check will depend on your age, overall health, lifestyle risk  factors, and family history of disease. Counseling  Your health care provider may ask you questions about your:  Alcohol use.  Tobacco use.  Drug use.  Emotional well-being.  Home and relationship well-being.  Sexual activity.  Eating habits.  History of falls.  Memory and ability to understand (cognition).  Work and work Astronomer.  Reproductive health. Screening  You may have the following tests or measurements:  Height, weight, and BMI.  Blood pressure.  Lipid and cholesterol levels. These may be checked every 5 years, or more frequently if you are over 7 years old.  Skin check.  Lung cancer screening. You may have this screening every year starting at age 30 if you have a 30-pack-year history of smoking and currently smoke or have quit within the past 15 years.  Fecal occult blood test (FOBT) of the stool. You may have this test every year starting at age 55.  Flexible sigmoidoscopy or colonoscopy. You may have a sigmoidoscopy every 5 years or a colonoscopy every 10 years starting at age 62.  Hepatitis C blood test.  Hepatitis B blood test.  Sexually transmitted disease (STD) testing.  Diabetes screening. This is done by checking your blood sugar (glucose) after you have not eaten for a while (fasting). You may have this done every 1-3 years.  Bone density scan. This is done to screen for osteoporosis. You may have this done starting at age 35.  Mammogram. This may be done every 1-2 years. Talk to your health care provider about how often you should have regular mammograms. Talk with your health care provider about your test results, treatment options, and if necessary, the need for more tests. Vaccines  Your health care provider may recommend certain vaccines,  such as:  Influenza vaccine. This is recommended every year.  Tetanus, diphtheria, and acellular pertussis (Tdap, Td) vaccine. You may need a Td booster every 10 years.  Zoster vaccine. You  may need this after age 5.  Pneumococcal 13-valent conjugate (PCV13) vaccine. One dose is recommended after age 76.  Pneumococcal polysaccharide (PPSV23) vaccine. One dose is recommended after age 76. Talk to your health care provider about which screenings and vaccines you need and how often you need them. This information is not intended to replace advice given to you by your health care provider. Make sure you discuss any questions you have with your health care provider. Document Released: 08/30/2015 Document Revised: 04/22/2016 Document Reviewed: 06/04/2015 Elsevier Interactive Patient Education  2017 [place] Prevention in the Home Falls can cause injuries. They can happen to people of all ages. There are many things you can do to make your home safe and to help prevent falls. What can I do on the outside of my home?  Regularly fix the edges of walkways and driveways and fix any cracks.  Remove anything that might make you trip as you walk through a door, such as a raised step or threshold.  Trim any bushes or trees on the path to your home.  Use bright outdoor lighting.  Clear any walking paths of anything that might make someone trip, such as rocks or tools.  Regularly check to see if handrails are loose or broken. Make sure that both sides of any steps have handrails.  Any raised decks and porches should have guardrails on the edges.  Have any leaves, snow, or ice cleared regularly.  Use sand or salt on walking paths during winter.  Clean up any spills in your garage right away. This includes oil or grease spills. What can I do in the bathroom?  Use night lights.  Install grab bars by the toilet and in the tub and shower. Do not use towel bars as grab bars.  Use non-skid mats or decals in the tub or shower.  If you need to sit down in the shower, use a plastic, non-slip stool.  Keep the floor dry. Clean up any water that spills on the floor as soon as  it happens.  Remove soap buildup in the tub or shower regularly.  Attach bath mats securely with double-sided non-slip rug tape.  Do not have throw rugs and other things on the floor that can make you trip. What can I do in the bedroom?  Use night lights.  Make sure that you have a light by your bed that is easy to reach.  Do not use any sheets or blankets that are too big for your bed. They should not hang down onto the floor.  Have a firm chair that has side arms. You can use this for support while you get dressed.  Do not have throw rugs and other things on the floor that can make you trip. What can I do in the kitchen?  Clean up any spills right away.  Avoid walking on wet floors.  Keep items that you use a lot in easy-to-reach places.  If you need to reach something above you, use a strong step stool that has a grab bar.  Keep electrical cords out of the way.  Do not use floor polish or wax that makes floors slippery. If you must use wax, use non-skid floor wax.  Do not have throw rugs and other things on  the floor that can make you trip. What can I do with my stairs?  Do not leave any items on the stairs.  Make sure that there are handrails on both sides of the stairs and use them. Fix handrails that are broken or loose. Make sure that handrails are as long as the stairways.  Check any carpeting to make sure that it is firmly attached to the stairs. Fix any carpet that is loose or worn.  Avoid having throw rugs at the top or bottom of the stairs. If you do have throw rugs, attach them to the floor with carpet tape.  Make sure that you have a light switch at the top of the stairs and the bottom of the stairs. If you do not have them, ask someone to add them for you. What else can I do to help prevent falls?  Wear shoes that:  Do not have high heels.  Have rubber bottoms.  Are comfortable and fit you well.  Are closed at the toe. Do not wear sandals.  If  you use a stepladder:  Make sure that it is fully opened. Do not climb a closed stepladder.  Make sure that both sides of the stepladder are locked into place.  Ask someone to hold it for you, if possible.  Clearly mark and make sure that you can see:  Any grab bars or handrails.  First and last steps.  Where the edge of each step is.  Use tools that help you move around (mobility aids) if they are needed. These include:  Canes.  Walkers.  Scooters.  Crutches.  Turn on the lights when you go into a dark area. Replace any light bulbs as soon as they burn out.  Set up your furniture so you have a clear path. Avoid moving your furniture around.  If any of your floors are uneven, fix them.  If there are any pets around you, be aware of where they are.  Review your medicines with your doctor. Some medicines can make you feel dizzy. This can increase your chance of falling. Ask your doctor what other things that you can do to help prevent falls. This information is not intended to replace advice given to you by your health care provider. Make sure you discuss any questions you have with your health care provider. Document Released: 05/30/2009 Document Revised: 01/09/2016 Document Reviewed: 09/07/2014 Elsevier Interactive Patient Education  2017 [employer].

## 2020-08-12 DIAGNOSIS — E1165 Type 2 diabetes mellitus with hyperglycemia: Secondary | ICD-10-CM | POA: Diagnosis not present

## 2020-08-12 DIAGNOSIS — E782 Mixed hyperlipidemia: Secondary | ICD-10-CM | POA: Diagnosis not present

## 2020-08-12 DIAGNOSIS — E063 Autoimmune thyroiditis: Secondary | ICD-10-CM | POA: Diagnosis not present

## 2020-08-13 ENCOUNTER — Telehealth: Payer: Medicare PPO

## 2020-08-19 DIAGNOSIS — E1142 Type 2 diabetes mellitus with diabetic polyneuropathy: Secondary | ICD-10-CM | POA: Diagnosis not present

## 2020-08-19 DIAGNOSIS — B351 Tinea unguium: Secondary | ICD-10-CM | POA: Diagnosis not present

## 2020-08-20 ENCOUNTER — Ambulatory Visit: Payer: Medicare PPO | Admitting: Pharmacist

## 2020-08-20 DIAGNOSIS — I10 Essential (primary) hypertension: Secondary | ICD-10-CM

## 2020-08-20 DIAGNOSIS — E1140 Type 2 diabetes mellitus with diabetic neuropathy, unspecified: Secondary | ICD-10-CM
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
## 2020-08-20 NOTE — Progress Notes (Signed)
I, [name], MD, have reviewed all documentation for this visit. The documentation on 08/20/20 for the exam, diagnosis, procedures, and orders are all accurate and complete.

## 2020-08-20 NOTE — Patient Instructions (Signed)
Visit Information  It was a pleasure speaking with you today! Thank you for letting me be a part of your care team. Please call with any questions or concerns.  Goals Addressed            This Visit's Progress   . Pharmacy care plan       CARE PLAN ENTRY (see longitudinal plan of care for additional care plan information)  Current Barriers:  . Chronic Disease Management support, education, and care coordination needs related to Hypertension, Hyperlipidemia, Diabetes, GERD, Hypothyroidism, Depression, and Overactive Bladder   Hypertension BP Readings from Last 3 Encounters:  08/07/20 122/80  05/30/20 130/80  01/23/20 (!) 112/57   . Pharmacist Clinical Goal(s): o Over the next 30 days, patient will work with PharmD and providers to achieve BP goal <130/80 . Current regimen:  . carvedilol 6.25 mg bid? Lf 7/19 . Losartan 25 mg qd ? Lf 7/19 . Interventions: o Reviewed fill history in computer . Patient self care activities - Over the next 30 days, patient will: o Check BP 2-3 times weekly, document, and provide at future appointments o Ensure daily salt intake < 2300 mg/day  Hyperlipidemia Lab Results  Component Value Date/Time   LDLCALC 64 05/20/2018 12:00 AM   . Pharmacist Clinical Goal(s): o Over the next 30 days, patient will work with PharmD and providers to maintain LDL goal < 70 . Current regimen:  o Atorvastatin 40 mg qd? Marland Kitchen Interventions: o Provided diet and exercise counseling. o Reviewed fill historyr . Patient self care activities - Over the next 30 days, patient will: o Take medications as prescribed  Diabetes Lab Results  Component Value Date/Time   HGBA1C 8.5 (H) 01/22/2020 12:12 PM   HGBA1C 7.8 11/03/2019 12:00 AM   HGBA1C 7.5 08/18/2018 12:00 AM   . Pharmacist Clinical Goal(s): o Over the next 30 days, patient will work with PharmD and providers to achieve A1c goal <7% . Current regimen:  o Ozempic 1 mg Q week o Invokamet 150/500 mg  bid . Interventions: o Reviewed goal glucose readings for an A1c of <7%, we want to see fasting sugars <130 and 2 hour after meal sugars <180.  o Reviewed cost barriers and will pursue patient assistance if patient qualifies financially. o Provided diet and exercise counseling. . Patient self care activities - Over the next 60 days, patient will: o Check blood sugar 3-4 times daily, document, and provide at future appointments o Contact provider with any episodes of hypoglycemia o Provide income info to pharmacy team for PAP application   Medication management . Pharmacist Clinical Goal(s): o Over the next 60 days, patient will work with PharmD and providers to achieve optimal medication adherence . Current pharmacy: [facility] . Interventions o Comprehensive medication review performed. o Continue current medication management strategy . Patient self care activities - Over the next 60 days, patient will: o Focus on medication adherence by fill dates o Take medications as prescribed o Report any questions or concerns to PharmD and/or provider(s)  Initial goal documentation        Sara Carr was given information about Chronic Care Management services today including:  1. CCM service includes personalized support from designated clinical staff supervised by her physician, including individualized plan of care and coordination with other care providers 2. 24/7 contact phone numbers for assistance for urgent and routine care needs. 3. Standard insurance, coinsurance, copays and deductibles apply for chronic care management only during months in which we provide  at least 20 minutes of these services. Most insurances cover these services at 100%, however patients may be responsible for any copay, coinsurance and/or deductible if applicable. This service may help you avoid the need for more expensive face-to-face services. 4. Only one practitioner may furnish and bill the service in a  calendar month. 5. The patient may stop CCM services at any time (effective at the end of the month) by phone call to the office staff.  Patient agreed to services and verbal consent obtained.   The patient verbalized understanding of instructions, educational materials, and care plan provided today and agreed to receive a mailed copy of patient instructions, educational materials, and care plan.  Telephone follow up appointment with pharmacy team member scheduled for: 2 monts  Mercer Pod. Tiburcio Pea PharmD, BCPS Clinical Pharmacist [phone]

## 2020-08-30 DIAGNOSIS — Z961 Presence of intraocular lens: Secondary | ICD-10-CM | POA: Diagnosis not present

## 2020-09-13 ENCOUNTER — Telehealth: Payer: Self-pay

## 2020-09-13 NOTE — Telephone Encounter (Signed)
Copied from CRM [id number]. Topic: General - Call Back - No Documentation >> Sep 13, 2020 11:35 AM Randol Kern wrote: Reason for CRM: Pt is requesting a call back from office, pt has questions about her medications. She has one that is missing, wants to know if this has been discontinued. tolterodine (DETROL LA) 4 MG 24 hr capsule  Also, states that she saw her dentist for implants. Since she had knee replacement, she needs a special drug to prevent infection. The dentist is afraid of infection.   Best contact: [phone]

## 2020-09-13 NOTE — Telephone Encounter (Signed)
Spoke to pt- insurance would no longer pay for [employer], she was switched to Oxybutinin in October. Also, I called the dentist, Burna Cash, and left a message for them to return my call as it has been 10 years or more since knee replacement occurred. [phone]

## 2020-09-18 ENCOUNTER — Telehealth: Payer: Self-pay | Admitting: Pharmacist

## 2020-09-18 NOTE — Progress Notes (Addendum)
Chronic Care Management Pharmacy Assistant   Name: Sara Carr  MRN: [id number] DOB: 1945/10/24  Reason for Encounter: Disease State Follow Up   Patient Questions:  1.  Have you seen any other providers since your last visit? No  2.  Any changes in your medicines or health? No   PCP : Duanne Limerick, MD  Allergies:   Allergies  Allergen Reactions   Penicillins Hives and Swelling    Medications: Outpatient Encounter Medications as of 09/18/2020  Medication Sig   acetaminophen (TYLENOL) 325 MG tablet Take 2 tablets (650 mg total) by mouth every 4 (four) hours as needed for mild pain ((score 1 to 3) or temp > 100.5).   atorvastatin (LIPITOR) 40 MG tablet Take 1 tablet (40 mg total) by mouth daily.   calcium carbonate (TUMS - DOSED IN MG ELEMENTAL CALCIUM) 500 MG chewable tablet Chew 1 tablet by mouth daily as needed for indigestion or heartburn.   carvedilol (COREG) 6.25 MG tablet Take 1 tablet (6.25 mg total) by mouth daily.   Continuous Blood Gluc Sensor (FREESTYLE LIBRE 14 DAY SENSOR) MISC    gabapentin (NEURONTIN) 800 MG tablet Take 800 mg by mouth 2 (two) times daily as needed (pain). [place]wood   ibuprofen (ADVIL) 200 MG tablet Take 200 mg by mouth every 6 (six) hours as needed. Takes occasionally for back pain   INVOKAMET XR 150-500 MG TB24 Take 1 tablet by mouth in the morning and at bedtime.   lansoprazole (PREVACID) 30 MG capsule Take 1 capsule (30 mg total) by mouth daily at 12 noon.   levothyroxine (SYNTHROID) 75 MCG tablet Take 75 mcg by mouth daily before breakfast.   losartan (COZAAR) 25 MG tablet Take 1 tablet (25 mg total) by mouth daily.   Multiple Vitamins-Minerals (ONE-A-DAY VITACRAVES ADULT) CHEW Chew 1 tablet by mouth daily. Patient take gummy   oxybutynin (DITROPAN) 5 MG/5ML syrup Take 5 mLs (5 mg total) by mouth 2 (two) times daily. (Patient taking differently: Take 5 mg by mouth 2 (two) times daily. Patient will start taking after she finished on hand  tolterodine supply)   Semaglutide (OZEMPIC, 0.25 OR 0.5 MG/DOSE, [place]) Inject 0.75 mg into the skin once a week.    sertraline (ZOLOFT) 50 MG tablet Take 1 tablet (50 mg total) by mouth daily.   tolterodine (DETROL LA) 4 MG 24 hr capsule Take 1 capsule (4 mg total) by mouth daily.   triamcinolone cream (KENALOG) 0.1 % Apply 1 application topically 2 (two) times daily. (Patient not taking: No sig reported)   No facility-administered encounter medications on file as of 09/18/2020.    Current Diagnosis: Patient Active Problem List   Diagnosis Date Noted   Lumbar radiculopathy 01/22/2020   Recent Relevant Labs: Lab Results  Component Value Date/Time   HGBA1C 8.5 (H) 01/22/2020 12:12 PM   HGBA1C 7.8 11/03/2019 12:00 AM   HGBA1C 7.5 08/18/2018 12:00 AM   MICROALBUR 70 08/18/2018 12:00 AM    Kidney Function Lab Results  Component Value Date/Time   CREATININE 0.82 01/18/2020 08:39 AM   CREATININE 1.08 (H) 11/28/2019 03:41 PM   GFRNONAA >60 01/18/2020 08:39 AM   GFRAA >60 01/18/2020 08:39 AM   Diabetic Disease State Review Current antihyperglycemic regimen:  Continuous blood glucose meter (FreeStyle Libre) Invokamet XR: Take 1 tablet by mouth in the morning and at bedtime. Ozempic 0.5mg /ml: Inject 0.75 mg into the skin once a week.   What recent interventions/DTPs have been made to improve  glycemic control:  The patient has been walking daily and watching her diet.   Have there been any recent hospitalizations or ED visits since last visit with CPP? No  Patient denies hypoglycemic symptoms, including Pale, Sweaty, Shaky, Hungry, Nervous/irritable and Vision changes.  Patient denies hyperglycemic symptoms, including blurry vision, excessive thirst, fatigue, polyuria and weakness.  How often are you checking your blood sugar? Has a continuous blood glucose monitor and check anytime.  What are your blood sugars ranging?  The patient reported her sugars ranging from 120-150. No reports  of blood sugars at or below 70.   During the week, how often does your blood glucose drop below 70? Never  Are you checking your feet daily/regularly? The patient reported she checks her skin and feet at least three times a week or when she remembers too.   Adherence Review: Is the patient currently on a STATIN medication? Yes/ Atorvastatin 40 mg.  Is the patient currently on ACE/ARB medication? Yes/ Losartan 25 mg Does the patient have >5 day gap between last estimated fill dates? Yes  I called the patient to discuss her diabetes and her need for possible patient assistance for her Ozempic and Invokamet XR. Throughout the conversation, the patient was pleasant and cooperative. She explained she checks her sugars throughout the day due to her freestyle libre monitor. Reported sugars ranging from 120-150. No hypoglycemic or hyperglycemic events were reported.  She explained her yearly income was 70,000. I explained the process of patient assistance and that I would start the process for her medications of Ozempic and Invokamet XR. I also explained I could not guarantee approval for assistance. She verbalized understanding of information. I stated to the patient I would be mailing her prefilled applications to her address and she will need to provide a copy of proof of income and complete the highlighted sections. Again she verbalized understanding. I also explained she could mail the application back to the MD office or drop it off. Once her application is completed the MD would sign the application. After the application is signed by the MD her application would be faxed to manufacture. Again she verbalized understanding to all information given. Provided the patient with my direct phone number and she was able to confirm the number back to me.    Burnard Leigh, LPN Clinical Pharmacist Assistant  [phone]   Follow-Up:  Pharmacist Review  I have reviewed the care management and care  coordination activities outlined in this encounter and I am certifying that I agree with the content of this note.  Mercer Pod. Tiburcio Pea PharmD, BCPS Clinical Pharmacist [facility] [phone]

## 2020-09-19 ENCOUNTER — Telehealth: Payer: Self-pay | Admitting: Pharmacist

## 2020-09-22 NOTE — Progress Notes (Signed)
    Chronic Care Management Pharmacy Assistant   Name: Sara Carr  MRN: [id number] DOB: Nov 20, 1945  Reason for Encounter: Prefilled patient assistance applications    PCP : Duanne Limerick, MD  Allergies:   Allergies  Allergen Reactions  . Penicillins Hives and Swelling    Medications: Outpatient Encounter Medications as of 09/19/2020  Medication Sig  . acetaminophen (TYLENOL) 325 MG tablet Take 2 tablets (650 mg total) by mouth every 4 (four) hours as needed for mild pain ((score 1 to 3) or temp > 100.5).  Marland Kitchen atorvastatin (LIPITOR) 40 MG tablet Take 1 tablet (40 mg total) by mouth daily.  . calcium carbonate (TUMS - DOSED IN MG ELEMENTAL CALCIUM) 500 MG chewable tablet Chew 1 tablet by mouth daily as needed for indigestion or heartburn.  . carvedilol (COREG) 6.25 MG tablet Take 1 tablet (6.25 mg total) by mouth daily.  . Continuous Blood Gluc Sensor (FREESTYLE LIBRE 14 DAY SENSOR) MISC   . gabapentin (NEURONTIN) 800 MG tablet Take 800 mg by mouth 2 (two) times daily as needed (pain). [place]  . ibuprofen (ADVIL) 200 MG tablet Take 200 mg by mouth every 6 (six) hours as needed. Takes occasionally for back pain  . INVOKAMET XR 150-500 MG TB24 Take 1 tablet by mouth in the morning and at bedtime.  . lansoprazole (PREVACID) 30 MG capsule Take 1 capsule (30 mg total) by mouth daily at 12 noon.  Marland Kitchen levothyroxine (SYNTHROID) 75 MCG tablet Take 75 mcg by mouth daily before breakfast.  . losartan (COZAAR) 25 MG tablet Take 1 tablet (25 mg total) by mouth daily.  . Multiple Vitamins-Minerals (ONE-A-DAY VITACRAVES ADULT) CHEW Chew 1 tablet by mouth daily. Patient take gummy  . oxybutynin (DITROPAN) 5 MG/5ML syrup Take 5 mLs (5 mg total) by mouth 2 (two) times daily. (Patient taking differently: Take 5 mg by mouth 2 (two) times daily. Patient will start taking after she finished on hand tolterodine supply)  . Semaglutide (OZEMPIC, 0.25 OR 0.5 MG/DOSE, [place]) Inject 0.75 mg into the skin once a  week.   . sertraline (ZOLOFT) 50 MG tablet Take 1 tablet (50 mg total) by mouth daily.  Marland Kitchen tolterodine (DETROL LA) 4 MG 24 hr capsule Take 1 capsule (4 mg total) by mouth daily.  Marland Kitchen triamcinolone cream (KENALOG) 0.1 % Apply 1 application topically 2 (two) times daily. (Patient not taking: No sig reported)   No facility-administered encounter medications on file as of 09/19/2020.    Current Diagnosis: Patient Active Problem List   Diagnosis Date Noted  . Lumbar radiculopathy 01/22/2020   Patient assistance applications prefilled for Invokamet XR and Ozempic.Printed and highlighted areas the patient is needing to completed. Mailed the application for patient's completion to address on file.   Burnard Leigh, LPN Clinical Pharmacist Assistant  [phone]     Follow-Up:  Patient Assistance Coordination

## 2020-09-30 ENCOUNTER — Ambulatory Visit
Admission: RE | Admit: 2020-09-30 | Discharge: 2020-09-30 | Disposition: A | Discharge status: Home / Self Care | Payer: Medicare PPO | Source: Ambulatory Visit | Attending: Family Medicine | Admitting: Family Medicine

## 2020-09-30 ENCOUNTER — Other Ambulatory Visit: Payer: Self-pay

## 2020-09-30 DIAGNOSIS — Z1231 Encounter for screening mammogram for malignant neoplasm of breast: Secondary | ICD-10-CM | POA: Diagnosis not present

## 2020-09-30 DIAGNOSIS — M85832 Other specified disorders of bone density and structure, left forearm: Secondary | ICD-10-CM | POA: Diagnosis not present

## 2020-09-30 DIAGNOSIS — Z780 Asymptomatic menopausal state: Secondary | ICD-10-CM | POA: Diagnosis not present

## 2020-10-02 DIAGNOSIS — E063 Autoimmune thyroiditis: Secondary | ICD-10-CM | POA: Diagnosis not present

## 2020-10-02 DIAGNOSIS — E782 Mixed hyperlipidemia: Secondary | ICD-10-CM | POA: Diagnosis not present

## 2020-10-02 DIAGNOSIS — E1165 Type 2 diabetes mellitus with hyperglycemia: Secondary | ICD-10-CM | POA: Diagnosis not present

## 2020-10-02 DIAGNOSIS — E669 Obesity, unspecified: Secondary | ICD-10-CM | POA: Diagnosis not present

## 2020-10-02 DIAGNOSIS — I10 Essential (primary) hypertension: Secondary | ICD-10-CM | POA: Diagnosis not present

## 2020-10-09 ENCOUNTER — Ambulatory Visit: Payer: Self-pay | Admitting: *Deleted

## 2020-10-09 NOTE — Telephone Encounter (Signed)
Summary: advice   Patient called in asking which vitamins she should take. PT needed clarification.      Call to patient - she is asking for verification on recommendation for bone density test.  Advised: Consistent with osteopenia would suggest initiating vitamin D 800 units and 1200 mg of calcium over-the-counter. Patient clarification on recommendation- understands what she needs now. Reason for Disposition . Caller has medicine question only, adult not sick, AND triager answers question  Answer Assessment - Initial Assessment Questions 1. NAME of MEDICATION: "What medicine are you calling about?"     Patient has questions about recommended vitamins 2. QUESTION: "What is your question?" (e.g., medication refill, side effect)     Patient states she wrote calcium and vitamin B- but thinks she may be wrong on vitamin B 3. PRESCRIBING HCP: "Who prescribed it?" Reason: if prescribed by specialist, call should be referred to that group.     PCP  Protocols used: MEDICATION QUESTION CALL-A-AH

## 2020-10-09 NOTE — Telephone Encounter (Signed)
noted 

## 2020-10-14 ENCOUNTER — Encounter: Payer: Self-pay | Admitting: Family Medicine

## 2020-10-17 ENCOUNTER — Other Ambulatory Visit: Payer: Self-pay | Admitting: Family Medicine

## 2020-10-17 DIAGNOSIS — E7801 Familial hypercholesterolemia: Secondary | ICD-10-CM

## 2020-10-17 DIAGNOSIS — I10 Essential (primary) hypertension: Secondary | ICD-10-CM

## 2020-10-17 NOTE — Telephone Encounter (Signed)
Future visit in 4 weeks  

## 2020-10-31 ENCOUNTER — Ambulatory Visit (INDEPENDENT_AMBULATORY_CARE_PROVIDER_SITE_OTHER): Payer: Medicare PPO | Admitting: Pharmacist

## 2020-10-31 DIAGNOSIS — I10 Essential (primary) hypertension: Secondary | ICD-10-CM

## 2020-10-31 DIAGNOSIS — E1140 Type 2 diabetes mellitus with diabetic neuropathy, unspecified: Secondary | ICD-10-CM | POA: Diagnosis not present

## 2020-10-31 NOTE — Patient Instructions (Addendum)
Visit Information  It was a pleasure speaking with you today. Thank you for letting me be part of your clinical team. Please call with any questions or concerns.   Goals Addressed            This Visit's Progress   . Monitor and Manage My Blood Sugar-Diabetes Type 2       Timeframe:  Long-Range Goal Priority:  High Start Date:                             Expected End Date:                       Follow Up Date  59- 8 week CPA, # month PharmD   - check blood sugar at prescribed times - check blood sugar if I feel it is too high or too low - take the blood sugar meter to all doctor visits    Why is this important?    Checking your blood sugar at home helps to keep it from getting very high or very low.   Writing the results in a diary or log helps the doctor know how to care for you.   Your blood sugar log should have the time, date and the results.   Also, write down the amount of insulin or other medicine that you take.   Other information, like what you ate, exercise done and how you were feeling, will also be helpful.     Notes:     . Pharmacy care plan       CARE PLAN ENTRY (see longitudinal plan of care for additional care plan information)  Current Barriers:  . Chronic Disease Management support, education, and care coordination needs related to Hypertension, Hyperlipidemia, Diabetes, GERD, Hypothyroidism, Depression, and Overactive Bladder   Hypertension BP Readings from Last 3 Encounters:  08/07/20 122/80  05/30/20 130/80  01/23/20 (!) 112/57   . Pharmacist Clinical Goal(s): o Over the next 30 days, patient will work with PharmD and providers to achieve BP goal <130/80 . Current regimen:  . carvedilol 6.25 mg bid . Losartan 25 mg qd . Interventions:  Counseled on importance of SMBP.  Reviewed goal BP Counseled on diet and exercise . Patient self care activities - Over the next 30 days, patient will: o Call [employer] to einquire about OTC benefits for BP  cuff, if not [employer] brand inexpensive option o Check BP 2-3 times weekly, document, and provide at future appointments o Ensure daily salt intake < 2300 mg/day   Hyperlipidemia Lab Results  Component Value Date/Time   LDLCALC 64 05/20/2018 12:00 AM   . Pharmacist Clinical Goal(s): o Over the next 30 days, patient will work with PharmD and providers to maintain LDL goal < 70 . Current regimen:  o Atorvastatin 40 mg qd? Marland Kitchen Interventions: o Provided diet and exercise counseling. . Patient self care activities - Over the next 30 days, patient will: o Take medications as prescribed    Diabetes HBA1C    7.5%        08/06/21 @ [facility] Lab Results  Component Value Date/Time   HGBA1C 8.5 (H) 01/22/2020 12:12 PM   HGBA1C 7.8 11/03/2019 12:00 AM   HGBA1C 7.5 08/18/2018 12:00 AM   . Pharmacist Clinical Goal(s): o Over the next 30 days, patient will work with PharmD and providers to achieve A1c goal <7% . Current regimen:  o Trulicity 0.75  mg qd o Invokamet 150/500 mg bid . Interventions: o Reviewed goal glucose readings for an A1c of <7%, we want to see fasting sugars <130 and 2 hour after meal sugars <180.  o  Verified patient is over income for patient assistance. o Reviewed insurance formulary-- Trulicity and Ozempic tier 3 through mail order. Patient currently receiving Trulicity for $47/month o Provided diet and exercise counseling. o Reviewed recent BG reading 96, 117, 136 with weekly average of 126 per Freestyle Libre o No readings >200 or <70 . Patient self care activities - Over the next 60 days, patient will: o Check blood sugar 6 times daily, document, and provide at future appointments o Contact provider with any episodes of hypoglycemia    Medication management . Pharmacist Clinical Goal(s): o Over the next 60 days, patient will work with PharmD and providers to achieve optimal medication adherence . Current pharmacy: [facility] . Interventions o Comprehensive  medication review performed. o Continue current medication management strategy . Patient self care activities - Over the next 90 days, patient will: o Focus on medication adherence by using pill box  o Take medications as prescribed o Report any questions or concerns to PharmD and/or provider(s)  Please see past updates related to this goal by clicking on the "Past Updates" button in the selected goal      . Track and Manage My Blood Pressure-Hypertension       Timeframe:  Short-Term Goal Priority:  High Start Date:                             Expected End Date:                       Follow Up Date 6-8 week CPA 3 month Pharmd   -obtain blood pressure cuff - check blood pressure 3 times per week - write blood pressure results in a log or diary    Why is this important?    You won't feel high blood pressure, but it can still hurt your blood vessels.   High blood pressure can cause heart or kidney problems. It can also cause a stroke.   Making lifestyle changes like losing a little weight or eating less salt will help.   Checking your blood pressure at home and at different times of the day can help to control blood pressure.   If the doctor prescribes medicine remember to take it the way the doctor ordered.   Call the office if you cannot afford the medicine or if there are questions about it.     Notes:        The patient verbalized understanding of instructions, educational materials, and care plan provided today and declined offer to receive copy of patient instructions, educational materials, and care plan.   Telephone follow up appointment with pharmacy team member scheduled for: 3 months, 6-8 weeks CPA   Mercer Pod. Winslow Verrill PharmD, BCPS Clinical Pharmacist [phone]  Diabetes Mellitus and Nutrition, Adult When you have diabetes, or diabetes mellitus, it is very important to have healthy eating habits because your blood sugar (glucose) levels are greatly affected by  what you eat and drink. Eating healthy foods in the right amounts, at about the same times every day, can help you:  Control your blood glucose.  Lower your risk of heart disease.  Improve your blood pressure.  Reach or maintain a healthy weight. What can  affect my meal plan? Every person with diabetes is different, and each person has different needs for a meal plan. Your health care provider may recommend that you work with a dietitian to make a meal plan that is best for you. Your meal plan may vary depending on factors such as:  The calories you need.  The medicines you take.  Your weight.  Your blood glucose, blood pressure, and cholesterol levels.  Your activity level.  Other health conditions you have, such as heart or kidney disease. How do carbohydrates affect me? Carbohydrates, also called carbs, affect your blood glucose level more than any other type of food. Eating carbs naturally raises the amount of glucose in your blood. Carb counting is a method for keeping track of how many carbs you eat. Counting carbs is important to keep your blood glucose at a healthy level, especially if you use insulin or take certain oral diabetes medicines. It is important to know how many carbs you can safely have in each meal. This is different for every person. Your dietitian can help you calculate how many carbs you should have at each meal and for each snack. How does alcohol affect me? Alcohol can cause a sudden decrease in blood glucose (hypoglycemia), especially if you use insulin or take certain oral diabetes medicines. Hypoglycemia can be a life-threatening condition. Symptoms of hypoglycemia, such as sleepiness, dizziness, and confusion, are similar to symptoms of having too much alcohol.  Do not drink alcohol if: ? Your health care provider tells you not to drink. ? You are pregnant, may be pregnant, or are planning to become pregnant.  If you drink alcohol: ? Do not drink on an  empty stomach. ? Limit how much you use to:  0-1 drink a day for women.  0-2 drinks a day for men. ? Be aware of how much alcohol is in your drink. In the [place] one drink equals one 12 oz bottle of beer (355 mL), one 5 oz glass of wine (148 mL), or one 1 oz glass of hard liquor (44 mL). ? Keep yourself hydrated with water, diet soda, or unsweetened iced tea.  Keep in mind that regular soda, juice, and other mixers may contain a lot of sugar and must be counted as carbs. What are tips for following this plan? Reading food labels  Start by checking the serving size on the "Nutrition Facts" label of packaged foods and drinks. The amount of calories, carbs, fats, and other nutrients listed on the label is based on one serving of the item. Many items contain more than one serving per package.  Check the total grams (g) of carbs in one serving. You can calculate the number of servings of carbs in one serving by dividing the total carbs by 15. For example, if a food has 30 g of total carbs per serving, it would be equal to 2 servings of carbs.  Check the number of grams (g) of saturated fats and trans fats in one serving. Choose foods that have a low amount or none of these fats.  Check the number of milligrams (mg) of salt (sodium) in one serving. Most people should limit total sodium intake to less than 2,300 mg per day.  Always check the nutrition information of foods labeled as "low-fat" or "nonfat." These foods may be higher in added sugar or refined carbs and should be avoided.  Talk to your dietitian to identify your daily goals for nutrients listed on the label.  Shopping  Avoid buying canned, pre-made, or processed foods. These foods tend to be high in fat, sodium, and added sugar.  Shop around the outside edge of the grocery store. This is where you will most often find fresh fruits and vegetables, bulk grains, fresh meats, and fresh dairy. Cooking  Use low-heat cooking methods,  such as baking, instead of high-heat cooking methods like deep frying.  Cook using healthy oils, such as olive, canola, or sunflower oil.  Avoid cooking with butter, cream, or high-fat meats. Meal planning  Eat meals and snacks regularly, preferably at the same times every day. Avoid going long periods of time without eating.  Eat foods that are high in fiber, such as fresh fruits, vegetables, beans, and whole grains. Talk with your dietitian about how many servings of carbs you can eat at each meal.  Eat 4-6 oz (112-168 g) of lean protein each day, such as lean meat, chicken, fish, eggs, or tofu. One ounce (oz) of lean protein is equal to: ? 1 oz (28 g) of meat, chicken, or fish. ? 1 egg. ?  cup (62 g) of tofu.  Eat some foods each day that contain healthy fats, such as avocado, nuts, seeds, and fish.   What foods should I eat? Fruits Berries. Apples. Oranges. Peaches. Apricots. Plums. Grapes. Mango. Papaya. Pomegranate. Kiwi. Cherries. Vegetables Lettuce. Spinach. Leafy greens, including kale, chard, collard greens, and mustard greens. Beets. Cauliflower. Cabbage. Broccoli. Carrots. Green beans. Tomatoes. Peppers. Onions. Cucumbers. Brussels sprouts. Grains Whole grains, such as whole-wheat or whole-grain bread, crackers, tortillas, cereal, and pasta. Unsweetened oatmeal. Quinoa. Brown or wild rice. Meats and other proteins Seafood. Poultry without skin. Lean cuts of poultry and beef. Tofu. Nuts. Seeds. Dairy Low-fat or fat-free dairy products such as milk, yogurt, and cheese. The items listed above may not be a complete list of foods and beverages you can eat. Contact a dietitian for more information. What foods should I avoid? Fruits Fruits canned with syrup. Vegetables Canned vegetables. Frozen vegetables with butter or cream sauce. Grains Refined white flour and flour products such as bread, pasta, snack foods, and cereals. Avoid all processed foods. Meats and other  proteins Fatty cuts of meat. Poultry with skin. Breaded or fried meats. Processed meat. Avoid saturated fats. Dairy Full-fat yogurt, cheese, or milk. Beverages Sweetened drinks, such as soda or iced tea. The items listed above may not be a complete list of foods and beverages you should avoid. Contact a dietitian for more information. Questions to ask a health care provider  Do I need to meet with a diabetes educator?  Do I need to meet with a dietitian?  What number can I call if I have questions?  When are the best times to check my blood glucose? Where to find more information:  American Diabetes Association: diabetes.org  Academy of Nutrition and Dietetics: www.eatright.[employer] of Diabetes and Digestive and Kidney Diseases: CarFlippers.tn  Association of Diabetes Care and Education Specialists: www.diabeteseducator.org Summary  It is important to have healthy eating habits because your blood sugar (glucose) levels are greatly affected by what you eat and drink.  A healthy meal plan will help you control your blood glucose and maintain a healthy lifestyle.  Your health care provider may recommend that you work with a dietitian to make a meal plan that is best for you.  Keep in mind that carbohydrates (carbs) and alcohol have immediate effects on your blood glucose levels. It is important to count carbs  and to use alcohol carefully. This information is not intended to replace advice given to you by your health care provider. Make sure you discuss any questions you have with your health care provider. Document Revised: 07/11/2019 Document Reviewed: 07/11/2019 Elsevier Patient Education  2021 [employer]

## 2020-10-31 NOTE — Progress Notes (Signed)
Chronic Care Management Pharmacy Note  10/31/2020 Name:  Sara Carr MRN:  [id number] DOB:  [birth year]  Subjective: Sara Carr is an 75 y.o. year old female who is a primary patient of Duanne Limerick, MD.  The CCM team was consulted for assistance with disease management and care coordination needs.    Engaged with patient by telephone for follow up visit in response to provider referral for pharmacy case management and/or care coordination services.   Consent to Services:  The patient was given information about Chronic Care Management services, agreed to services, and gave verbal consent prior to initiation of services.  Please see initial visit note for detailed documentation.   Patient Care Team: Duanne Limerick, MD as PCP - General (Family Medicine) Otelia Sergeant, NP as Nurse Practitioner (Endocrinology) Recardo Evangelist, [place] as Attending Physician (Podiatry) Lajean Manes, [facility] (Pharmacist)  Recent office visits: None  Recent consult visits: 10/02/20- blackwood, NP(endo)-  Continue Invokamet, change to Trulicity, increase levothyroxine qd  by 1/2 tab on Mondays  Hospital visits: None in previous 6 months  Objective:  Lab Results  Component Value Date   CREATININE 0.82 01/18/2020   BUN 21 01/18/2020   GFRNONAA >60 01/18/2020   GFRAA >60 01/18/2020   NA 143 01/18/2020   K 4.1 01/18/2020   CALCIUM 9.2 01/18/2020   CO2 26 01/18/2020    Lab Results  Component Value Date/Time   HGBA1C 8.5 (H) 01/22/2020 12:12 PM   HGBA1C 7.8 11/03/2019 12:00 AM   HGBA1C 7.5 08/18/2018 12:00 AM   MICROALBUR 70 08/18/2018 12:00 AM    Last diabetic Eye exam: No results found for: HMDIABEYEEXA  Last diabetic Foot exam: No results found for: HMDIABFOOTEX   Lab Results  Component Value Date   CHOL 124 05/20/2018   HDL 35 05/20/2018   LDLCALC 64 05/20/2018   TRIG 176 (A) 05/20/2018    Hepatic Function Latest Ref Rng & Units 11/28/2019 05/20/2018  Albumin 3.7 - 4.7  g/dL 4.6 -  AST 13 - 35 - 16  ALT 7 - 35 - 16    No results found for: TSH, FREET4  CBC Latest Ref Rng & Units 01/18/2020 05/20/2018  WBC 4.0 - 10.5 K/uL 6.3 -  Hemoglobin 12.0 - 15.0 g/dL 50.2 77.4  Hematocrit 12.8 - 46.0 % 41.0 42  Platelets 150 - 400 K/uL 233 280    No results found for: VD25OH  Clinical ASCVD: No  The ASCVD Risk score Denman George DC Jr., et al., 2013) failed to calculate for the following reasons:   The valid total cholesterol range is 130 to 320 mg/dL    Depression screen [facility] 2/9 08/07/2020 05/30/2020 11/28/2019  Decreased Interest 0 0 3  Down, Depressed, Hopeless 0 0 3  PHQ - 2 Score 0 0 6  Altered sleeping - 0 1  Tired, decreased energy - 0 1  Change in appetite - 0 0  Feeling bad or failure about yourself  - 0 0  Trouble concentrating - 0 0  Moving slowly or fidgety/restless - 0 0  Suicidal thoughts - 0 0  PHQ-9 Score - 0 8  Difficult doing work/chores - - Somewhat difficult      Social History   Tobacco Use  Smoking Status Never Smoker  Smokeless Tobacco Never Used  Tobacco Comment   none   BP Readings from Last 3 Encounters:  08/07/20 122/80  05/30/20 130/80  01/23/20 (!) 112/57   Pulse Readings from Last 3 Encounters:  08/07/20 (!) 102  05/30/20 88  01/23/20 72   Wt Readings from Last 3 Encounters:  08/07/20 184 lb 12.8 oz (83.8 kg)  05/30/20 190 lb (86.2 kg)  01/17/20 182 lb (82.6 kg)    Assessment/Interventions: Review of patient past medical history, allergies, medications, health status, including review of consultants reports, laboratory and other test data, was performed as part of comprehensive evaluation and provision of chronic care management services.   SDOH:  (Social Determinants of Health) assessments and interventions performed: No   CCM Care Plan  Allergies  Allergen Reactions  . Penicillins Hives and Swelling    Medications Reviewed Today    Reviewed by Lajean ManesHarris, Wylene Weissman S, [facility] (Pharmacist) on 10/31/20 at 0944  Med  List Status: <None>  Medication Order Taking? Sig Documenting Provider Last Dose Status Informant  acetaminophen (TYLENOL) 325 MG tablet [id number] Yes Take 2 tablets (650 mg total) by mouth every 4 (four) hours as needed for mild pain ((score 1 to 3) or temp > 100.5). Patsey BertholdZdeb, Christine, NP Taking Active   atorvastatin (LIPITOR) 40 MG tablet [id number] Yes TAKE 1 TABLET EVERY DAY Duanne LimerickJones, Deanna C, MD Taking Active   calcium carbonate (TUMS - DOSED IN MG ELEMENTAL CALCIUM) 500 MG chewable tablet [id number] Yes Chew 1 tablet by mouth daily as needed for indigestion or heartburn. [provider] Taking Active   carvedilol (COREG) 6.25 MG tablet [id number] Yes TAKE 1 TABLET EVERY DAY Duanne LimerickJones, Deanna C, MD Taking Active   Continuous Blood Gluc Sensor (FREESTYLE LIBRE 14 DAY [place]ENSOR) [place] [id number] Yes  [provider] Taking Active   Dulaglutide (TRULICITY) 0.75 MG/0.5ML SOPN [id number] Yes Inject 0.75 mg into the skin once a week. Takes on Saturdays [provider] Taking Active Self  gabapentin (NEURONTIN) 800 MG tablet [id number] Yes Take 800 mg by mouth 2 (two) times daily as needed (pain). blackwood [provider] Taking Active Self  ibuprofen (ADVIL) 200 MG tablet [id number] Yes Take 200 mg by mouth every 6 (six) hours as needed. Takes occasionally for back pain [provider] Taking Active Self  INVOKAMET XR 150-500 MG TB24 [id number] Yes Take 1 tablet by mouth in the morning and at bedtime. [provider] Taking Active Self  lansoprazole (PREVACID) 30 MG capsule [id number] Yes Take 1 capsule (30 mg total) by mouth daily at 12 noon. Duanne LimerickJones, Deanna C, MD Taking Active   levothyroxine (SYNTHROID) 75 MCG tablet [id number] Yes Take 75 mcg by mouth daily before breakfast. Takes 75 mcg tues-Sunday and takes 112.5 mcf (1.5 tabs) on Monday [provider] Taking Active Self  losartan (COZAAR) 25 MG tablet [id number] Yes TAKE 1 TABLET EVERY DAY Duanne LimerickJones, Deanna C,  MD Taking Active   Multiple Vitamins-Minerals (ONE-A-DAY VITACRAVES ADULT) CHEW [id number] Yes Chew 1 tablet by mouth daily. Patient take gummy [provider] Taking Active Self  oxybutynin (DITROPAN) 5 MG/5ML syrup [id number] Yes Take 5 mLs (5 mg total) by mouth 2 (two) times daily. Duanne LimerickJones, Deanna C, MD Taking Active Self  sertraline (ZOLOFT) 50 MG tablet [id number] Yes Take 1 tablet (50 mg total) by mouth daily. Duanne LimerickJones, Deanna C, MD Taking Active   triamcinolone cream (KENALOG) 0.1 % [id number] No Apply 1 application topically 2 (two) times daily.  Patient not taking: No sig reported   Duanne LimerickJones, Deanna C, MD Not Taking Active           Patient Active Problem List   Diagnosis Date Noted  . Lumbar radiculopathy 01/22/2020    Immunization History  Administered Date(s) Administered  . Fluad Quad(high Dose 65+) 06/27/2019, 05/30/2020  . Influenza-Unspecified 10/14/2006, 07/13/2016  . PFIZER(Purple Top)SARS-COV-2 Vaccination 08/29/2019, 09/16/2019, 06/10/2020  . Pneumococcal Conjugate-13 06/27/2019  . Pneumococcal Polysaccharide-23 07/13/2016  . Tdap 11/16/2015  . Zoster 02/27/2011    Conditions to be addressed/monitored:  Hypertension, Hyperlipidemia, Diabetes, GERD, Hypothyroidism, Depression and Overactive Bladder  Care Plan : CCM Pharmacy  Care Plan  Updates made by Lajean Manes, RPH since 10/31/2020 12:00 AM    Problem: DM, HTN, HLD, Hypothyroidism, osteopenia   Priority: High    Long-Range Goal: Disease Management   Start Date: 10/31/2020  This Visit's Progress: On track  Priority: High  Note:   CARE PLAN ENTRY (see longitudinal plan of care for additional care plan information)  Current Barriers:  . Chronic Disease Management support, education, and care coordination needs related to Hypertension, Hyperlipidemia, Diabetes, GERD, Hypothyroidism, Depression, and Overactive Bladder   Hypertension BP Readings from Last 3 Encounters:  08/07/20 122/80  05/30/20  130/80  01/23/20 (!) 112/57   . Pharmacist Clinical Goal(s): o Over the next 30 days, patient will work with PharmD and providers to achieve BP goal <130/80 . Current regimen:  . carvedilol 6.25 mg bid . Losartan 25 mg qd . Interventions:  Counseled on importance of SMBP.  Reviewed goal BP Counseled on diet and exercise . Patient self care activities - Over the next 30 days, patient will: o Call [employer] to einquire about OTC benefits for BP cuff, if not [employer] brand inexpensive option o Check BP 2-3 times weekly, document, and provide at future appointments o Ensure daily salt intake < 2300 mg/day   Hyperlipidemia Lab Results  Component Value Date/Time   LDLCALC 64 05/20/2018 12:00 AM   . Pharmacist Clinical Goal(s): o Over the next 30 days, patient will work with PharmD and providers to maintain LDL goal < 70 . Current regimen:  o Atorvastatin 40 mg qd? Marland Kitchen Interventions: o Provided diet and exercise counseling. . Patient self care activities - Over the next 30 days, patient will: o Take medications as prescribed    Diabetes HBA1C    7.5%        08/06/21 @ ENDO Lab Results  Component Value Date/Time   HGBA1C 8.5 (H) 01/22/2020 12:12 PM   HGBA1C 7.8 11/03/2019 12:00 AM   HGBA1C 7.5 08/18/2018 12:00 AM   . Pharmacist Clinical Goal(s): o Over the next 30 days, patient will work with PharmD and providers to achieve A1c goal <7% . Current regimen:  o Trulicity 0.75 mg qd o Invokamet 150/500 mg bid . Interventions: o Reviewed goal glucose readings for an A1c of <7%, we want to see fasting sugars <130 and 2 hour after meal sugars <180.  o  Verified patient is over income for patient assistance. o Reviewed insurance formulary-- Trulicity and Ozempic tier 3 through mail order. Patient currently receiving Trulicity for $47/month o Provided diet and exercise counseling. o Reviewed recent BG reading 96, 117, 136 with weekly average of 126 per Freestyle Libre o No readings >200  or <70 . Patient self care activities - Over the next 60 days, patient will: o Check blood sugar 6 times daily, document, and provide at future appointments o Contact provider with any episodes of hypoglycemia    Medication management . Pharmacist Clinical Goal(s): o Over the next 60 days, patient will work with PharmD and providers to achieve optimal medication adherence . Current pharmacy: [facility] . Interventions o Comprehensive medication review performed. o  Continue current medication management strategy . Patient self care activities - Over the next 90 days, patient will: o Focus on medication adherence by using pill box  o Take medications as prescribed o Report any questions or concerns to PharmD and/or provider(s)      Medication Assistance: Patient not eligible. Over income  Patient's preferred pharmacy is:  [facility] [address] [place], [place] - [address] [place] [address] Phone: [phone] Fax: [phone]  [facility] - [place], [place] - [address] [place] [address] Phone: [phone] Fax: [phone]  Uses pill box? Yes Pt endorses 99% compliance  We discussed: Current pharmacy is preferred with insurance plan and patient is satisfied with pharmacy services Patient decided to: Continue current medication management strategy  Care Plan and Follow Up Patient Decision:  Patient agrees to Care Plan and Follow-up.  Plan: Telephone follow up appointment with care management team member scheduled for:  CPA 16-8 weeks. Pharm D 3 months  Mercer Pod. Tiburcio Pea PharmD, BCPS Clinical Pharmacist [facility] [phone]

## 2020-11-01 NOTE — Progress Notes (Signed)
I, Fronia Depass, MD, have reviewed all documentation for this visit. The documentation on 11/01/20 for the exam, diagnosis, procedures, and orders are all accurate and complete. 

## 2020-11-11 ENCOUNTER — Other Ambulatory Visit: Payer: Self-pay | Admitting: Family Medicine

## 2020-11-11 DIAGNOSIS — K219 Gastro-esophageal reflux disease without esophagitis: Secondary | ICD-10-CM

## 2020-11-11 DIAGNOSIS — F324 Major depressive disorder, single episode, in partial remission: Secondary | ICD-10-CM

## 2020-11-15 ENCOUNTER — Encounter: Payer: Self-pay | Admitting: Family Medicine

## 2020-11-15 ENCOUNTER — Ambulatory Visit: Payer: Medicare PPO | Admitting: Family Medicine

## 2020-11-15 ENCOUNTER — Other Ambulatory Visit: Payer: Self-pay

## 2020-11-15 VITALS — BP 124/78 | HR 87 | Ht 60.0 in | Wt 187.0 lb

## 2020-11-15 DIAGNOSIS — E7801 Familial hypercholesterolemia: Secondary | ICD-10-CM | POA: Diagnosis not present

## 2020-11-15 DIAGNOSIS — R5383 Other fatigue: Secondary | ICD-10-CM | POA: Diagnosis not present

## 2020-11-15 DIAGNOSIS — I10 Essential (primary) hypertension: Secondary | ICD-10-CM | POA: Diagnosis not present

## 2020-11-15 DIAGNOSIS — F324 Major depressive disorder, single episode, in partial remission: Secondary | ICD-10-CM

## 2020-11-15 DIAGNOSIS — N3941 Urge incontinence: Secondary | ICD-10-CM

## 2020-11-15 DIAGNOSIS — K219 Gastro-esophageal reflux disease without esophagitis: Secondary | ICD-10-CM

## 2020-11-15 MED ORDER — ATORVASTATIN CALCIUM 40 MG PO TABS: 40.0000 mg | ORAL_TABLET | Freq: Every day | ORAL | 1 refills | Status: DC

## 2020-11-15 MED ORDER — OXYBUTYNIN CHLORIDE 5 MG/5ML PO SYRP: 5.0000 mg | ORAL_SOLUTION | Freq: Two times a day (BID) | ORAL | 1 refills | Status: DC

## 2020-11-15 MED ORDER — LOSARTAN POTASSIUM 25 MG PO TABS: 25.0000 mg | ORAL_TABLET | Freq: Every day | ORAL | 1 refills | Status: DC

## 2020-11-15 MED ORDER — CARVEDILOL 6.25 MG PO TABS: 6.2500 mg | ORAL_TABLET | Freq: Every day | ORAL | 1 refills | Status: DC

## 2020-11-15 MED ORDER — LANSOPRAZOLE 30 MG PO CPDR: DELAYED_RELEASE_CAPSULE | ORAL | 1 refills | Status: DC

## 2020-11-15 MED ORDER — SERTRALINE HCL 50 MG PO TABS: 50.0000 mg | ORAL_TABLET | Freq: Every day | ORAL | 1 refills | Status: DC

## 2020-11-15 NOTE — Progress Notes (Signed)
Date:  11/15/2020   Name:  Sara Carr   DOB:  Oct 01, 1945   MRN:  [id number]   Chief Complaint: Hyperlipidemia, Gastroesophageal Reflux, Hypertension, overactive bladder, and Depression (4 and 5)  Hyperlipidemia This is a chronic problem. The current episode started more than 1 year ago. The problem is controlled. Recent lipid tests were reviewed and are normal. She has no history of chronic renal disease, diabetes, hypothyroidism, liver disease, obesity or nephrotic syndrome. There are no known factors aggravating her hyperlipidemia. Associated symptoms include shortness of breath. Pertinent negatives include no chest pain, focal sensory loss, focal weakness, leg pain or myalgias. Current antihyperlipidemic treatment includes statins. The current treatment provides moderate improvement of lipids. There are no compliance problems.  Risk factors for coronary artery disease include dyslipidemia.  Gastroesophageal Reflux She reports no abdominal pain, no belching, no chest pain, no choking, no coughing, no dysphagia, no early satiety, no globus sensation, no heartburn, no hoarse voice, no nausea, no sore throat, no stridor, no tooth decay, no water brash or no wheezing. This is a chronic problem. The current episode started more than 1 year ago. The problem has been gradually improving. Associated symptoms include fatigue. She has tried a PPI for the symptoms. The treatment provided moderate relief.  Hypertension This is a chronic problem. The current episode started more than 1 year ago. The problem has been waxing and waning since onset. The problem is controlled. Associated symptoms include shortness of breath. Pertinent negatives include no anxiety, blurred vision, chest pain, headaches, malaise/fatigue, neck pain, orthopnea, palpitations, peripheral edema, PND or sweats. There are no associated agents to hypertension. Risk factors for coronary artery disease include dyslipidemia. Past treatments  include alpha 1 blockers, beta blockers and angiotensin blockers. The current treatment provides moderate improvement. There are no compliance problems.  There is no history of angina, kidney disease, CAD/MI, CVA, heart failure, left ventricular hypertrophy or PVD. There is no history of chronic renal disease, a hypertension causing med or renovascular disease.  Depression        This is a chronic problem.  The current episode started more than 1 year ago.   The problem has been gradually improving since onset.  Associated symptoms include fatigue and irritable.  Associated symptoms include no decreased concentration, no helplessness, no hopelessness, does not have insomnia, no restlessness, no decreased interest, no appetite change, no body aches, no myalgias, no headaches, no indigestion, not sad and no suicidal ideas.  Past treatments include SNRIs - Serotonin and norepinephrine reuptake inhibitors.  Compliance with treatment is good.  Previous treatment provided moderate relief.   Pertinent negatives include no hypothyroidism and no anxiety.   Lab Results  Component Value Date   CREATININE 0.82 01/18/2020   BUN 21 01/18/2020   NA 143 01/18/2020   K 4.1 01/18/2020   CL 108 01/18/2020   CO2 26 01/18/2020   Lab Results  Component Value Date   CHOL 124 05/20/2018   HDL 35 05/20/2018   LDLCALC 64 05/20/2018   TRIG 176 (A) 05/20/2018   No results found for: TSH Lab Results  Component Value Date   HGBA1C 8.5 (H) 01/22/2020   Lab Results  Component Value Date   WBC 6.3 01/18/2020   HGB 12.8 01/18/2020   HCT 41.0 01/18/2020   MCV 83.5 01/18/2020   PLT 233 01/18/2020   Lab Results  Component Value Date   ALT 16 05/20/2018   AST 16 05/20/2018     Review of  Systems  Constitutional: Positive for fatigue. Negative for appetite change, chills, fever, malaise/fatigue and unexpected weight change.  HENT: Negative for congestion, ear discharge, ear pain, hoarse voice, rhinorrhea, sinus  pressure, sneezing and sore throat.   Eyes: Negative for blurred vision, photophobia, pain, discharge, redness and itching.  Respiratory: Positive for shortness of breath. Negative for cough, choking, wheezing and stridor.   Cardiovascular: Negative for chest pain, palpitations, orthopnea and PND.  Gastrointestinal: Negative for abdominal pain, blood in stool, constipation, diarrhea, dysphagia, heartburn, nausea and vomiting.  Endocrine: Negative for cold intolerance, heat intolerance, polydipsia, polyphagia and polyuria.  Genitourinary: Negative for dysuria, flank pain, frequency, hematuria, menstrual problem, pelvic pain, urgency, vaginal bleeding and vaginal discharge.  Musculoskeletal: Positive for back pain. Negative for arthralgias, myalgias and neck pain.  Skin: Negative for rash.  Allergic/Immunologic: Negative for environmental allergies and food allergies.  Neurological: Negative for dizziness, focal weakness, weakness, light-headedness, numbness and headaches.  Hematological: Negative for adenopathy. Does not bruise/bleed easily.  Psychiatric/Behavioral: Positive for depression. Negative for decreased concentration, dysphoric mood and suicidal ideas. The patient is not nervous/anxious and does not have insomnia.     Patient Active Problem List   Diagnosis Date Noted  . Lumbar radiculopathy 01/22/2020    Allergies  Allergen Reactions  . Penicillins Hives and Swelling    Past Surgical History:  Procedure Laterality Date  . BACK SURGERY    . CHOLECYSTECTOMY  1979  . JOINT REPLACEMENT Bilateral 2017  . LUMBAR LAMINECTOMY/ DECOMPRESSION WITH MET-RX N/A 01/22/2020   Procedure: L3-4 laminectomy;  Surgeon: [name], MD;  Location: [facility] ORS;  Service: Neurosurgery;  Laterality: N/A;  . REPLACEMENT TOTAL KNEE Bilateral   . SPINE SURGERY  2004    Social History   Tobacco Use  . Smoking status: Never Smoker  . Smokeless tobacco: Never Used  . Tobacco comment: none  Vaping  Use  . Vaping Use: Never used  Substance Use Topics  . Alcohol use: Never  . Drug use: Never     Medication list has been reviewed and updated.  Current Meds  Medication Sig  . acetaminophen (TYLENOL) 325 MG tablet Take 2 tablets (650 mg total) by mouth every 4 (four) hours as needed for mild pain ((score 1 to 3) or temp > 100.5).  Marland Kitchen atorvastatin (LIPITOR) 40 MG tablet TAKE 1 TABLET EVERY DAY  . calcium carbonate (TUMS - DOSED IN MG ELEMENTAL CALCIUM) 500 MG chewable tablet Chew 1 tablet by mouth daily as needed for indigestion or heartburn.  . carvedilol (COREG) 6.25 MG tablet TAKE 1 TABLET EVERY DAY  . Continuous Blood Gluc Sensor (FREESTYLE LIBRE 14 DAY SENSOR) MISC   . Dulaglutide (TRULICITY) 1.63 WG/6.6ZL SOPN Inject 0.75 mg into the skin once a week. Takes on Saturdays  . gabapentin (NEURONTIN) 800 MG tablet Take 800 mg by mouth 2 (two) times daily as needed (pain). blackwood  . INVOKAMET XR 150-500 MG TB24 Take 1 tablet by mouth in the morning and at bedtime.  . lansoprazole (PREVACID) 30 MG capsule TAKE 1 CAPSULE DAILY AT 12 NOON.  Marland Kitchen levothyroxine (SYNTHROID) 75 MCG tablet Take 75 mcg by mouth daily before breakfast. Takes 75 mcg tues-Sunday and takes 112.5 mcf (1.5 tabs) on Monday  . losartan (COZAAR) 25 MG tablet TAKE 1 TABLET EVERY DAY  . Multiple Vitamins-Minerals (ONE-A-DAY VITACRAVES ADULT) CHEW Chew 1 tablet by mouth daily. Patient take gummy  . oxybutynin (DITROPAN) 5 MG/5ML syrup Take 5 mLs (5 mg total) by mouth 2 (two)  times daily.  . Semaglutide (OZEMPIC, 0.25 OR 0.5 MG/DOSE, [place]) Inject 0.75 mg into the skin once a week.  . sertraline (ZOLOFT) 50 MG tablet TAKE 1 TABLET EVERY DAY  . triamcinolone cream (KENALOG) 0.1 % Apply 1 application topically 2 (two) times daily.    PHQ 2/9 Scores 11/15/2020 08/07/2020 05/30/2020 11/28/2019  PHQ - 2 Score 0 0 0 6  PHQ- 9 Score 4 - 0 8    GAD 7 : Generalized Anxiety Score 11/15/2020 05/30/2020 11/28/2019 11/23/2019  Nervous,  Anxious, on Edge 1 0 0 0  Control/stop worrying 0 0 0 0  Worry too much - different things 0 0 0 0  Trouble relaxing 0 0 0 0  Restless 0 0 0 0  Easily annoyed or irritable 2 0 0 3  Afraid - awful might happen 2 0 0 0  Total GAD 7 Score 5 0 0 3  Anxiety Difficulty Not difficult at all - - Not difficult at all    BP Readings from Last 3 Encounters:  11/15/20 124/78  08/07/20 122/80  05/30/20 130/80    Physical Exam Vitals and nursing note reviewed.  Constitutional:      General: She is irritable. She is not in acute distress.    Appearance: She is not diaphoretic.  HENT:     Head: Normocephalic and atraumatic.     Right Ear: Tympanic membrane, ear canal and external ear normal.     Left Ear: Tympanic membrane, ear canal and external ear normal.     Nose: Nose normal. No congestion.     Mouth/Throat:     Mouth: Mucous membranes are moist.  Eyes:     General:        Right eye: No discharge.        Left eye: No discharge.     Conjunctiva/sclera: Conjunctivae normal.     Pupils: Pupils are equal, round, and reactive to light.  Neck:     Thyroid: No thyromegaly.     Vascular: No JVD.  Cardiovascular:     Rate and Rhythm: Normal rate and regular rhythm.     Heart sounds: Normal heart sounds. No murmur heard. No friction rub. No gallop.   Pulmonary:     Effort: Pulmonary effort is normal.     Breath sounds: Normal breath sounds. No wheezing, rhonchi or rales.  Chest:     Chest wall: No tenderness.  Abdominal:     General: Bowel sounds are normal.     Palpations: Abdomen is soft. There is no mass.     Tenderness: There is no abdominal tenderness. There is no guarding.  Musculoskeletal:        General: Normal range of motion.     Cervical back: Normal range of motion and neck supple.  Lymphadenopathy:     Cervical: No cervical adenopathy.  Skin:    General: Skin is warm and dry.  Neurological:     Mental Status: She is alert.     Motor: No weakness.     Deep Tendon  Reflexes: Reflexes are normal and symmetric.     Wt Readings from Last 3 Encounters:  11/15/20 187 lb (84.8 kg)  08/07/20 184 lb 12.8 oz (83.8 kg)  05/30/20 190 lb (86.2 kg)    BP 124/78   Pulse 87   Ht 5' (1.524 m)   Wt 187 lb (84.8 kg)   BMI 36.52 kg/m   Assessment and Plan: 1. Essential hypertension .  Controlled.  Stable.  Continue losartan 25 mg once a day.  Will check renal function panel for electrolytes and GFR. - losartan (COZAAR) 25 MG tablet; Take 1 tablet (25 mg total) by mouth daily.  Dispense: 90 tablet; Refill: 1 - Renal Function Panel  2. Familial hypercholesterolemia .  Controlled.  Stable.  Continue atorvastatin 40 mg once a day.  Will check lipid panel for current status of lipid management. - atorvastatin (LIPITOR) 40 MG tablet; Take 1 tablet (40 mg total) by mouth daily.  Dispense: 90 tablet; Refill: 1 - Lipid Panel With LDL/HDL Ratio  3. Gastroesophageal reflux disease without esophagitis Chronic.  Controlled.  Stable.  Continue Prevacid 30 mg once a day. - lansoprazole (PREVACID) 30 MG capsule; TAKE 1 CAPSULE DAILY AT 12 NOON.  Dispense: 90 capsule; Refill: 1  4. Urge incontinence Chronic.  Controlled.  Stable.  Continue Ditropan 5 mg/tsp. 1 teaspoon twice a day - oxybutynin (DITROPAN) 5 MG/5ML syrup; Take 5 mLs (5 mg total) by mouth 2 (two) times daily.  Dispense: 120 mL; Refill: 1  5. Major depressive disorder in partial remission, unspecified whether recurrent ([place]) Diet.  Controlled.  Stable.  PHQ is 4 Gad score is 5.  Continue sertraline 50 mg once a day. - sertraline (ZOLOFT) 50 MG tablet; Take 1 tablet (50 mg total) by mouth daily.  Dispense: 90 tablet; Refill: 1  6. Fatigue, unspecified type Chronic.  Controlled.  Stable. This is been going on for some for couple months and we will check a CBC.  Patient is followed for thyroid by endocrinology. - CBC with Differential/Platelet

## 2020-11-16 LAB — CBC WITH DIFFERENTIAL/PLATELET
Basophils Absolute: 0.1 10*3/uL (ref 0.0–0.2)
Basos: 1 %
EOS (ABSOLUTE): 0.1 10*3/uL (ref 0.0–0.4)
Eos: 1 %
Hematocrit: 45.4 % (ref 34.0–46.6)
Hemoglobin: 14.1 g/dL (ref 11.1–15.9)
Immature Grans (Abs): 0 10*3/uL (ref 0.0–0.1)
Immature Granulocytes: 0 %
Lymphocytes Absolute: 1.9 10*3/uL (ref 0.7–3.1)
Lymphs: 21 %
MCH: 27.2 pg (ref 26.6–33.0)
MCHC: 31.1 g/dL — ABNORMAL LOW (ref 31.5–35.7)
MCV: 88 fL (ref 79–97)
Monocytes Absolute: 0.6 10*3/uL (ref 0.1–0.9)
Monocytes: 6 %
Neutrophils Absolute: 6.5 10*3/uL (ref 1.4–7.0)
Neutrophils: 71 %
Platelets: 288 10*3/uL (ref 150–450)
RBC: 5.19 x10E6/uL (ref 3.77–5.28)
RDW: 14.7 % (ref 11.7–15.4)
WBC: 9.2 10*3/uL (ref 3.4–10.8)

## 2020-11-16 LAB — RENAL FUNCTION PANEL
Albumin: 4.3 g/dL (ref 3.7–4.7)
BUN/Creatinine Ratio: 21 (ref 12–28)
BUN: 24 mg/dL (ref 8–27)
CO2: 21 mmol/L (ref 20–29)
Calcium: 9.5 mg/dL (ref 8.7–10.3)
Chloride: 105 mmol/L (ref 96–106)
Creatinine, Ser: 1.14 mg/dL — ABNORMAL HIGH (ref 0.57–1.00)
Glucose: 178 mg/dL — ABNORMAL HIGH (ref 65–99)
Phosphorus: 3.7 mg/dL (ref 3.0–4.3)
Potassium: 5.4 mmol/L — ABNORMAL HIGH (ref 3.5–5.2)
Sodium: 143 mmol/L (ref 134–144)
eGFR: 50 mL/min/{1.73_m2} — ABNORMAL LOW (ref >59)

## 2020-11-16 LAB — LIPID PANEL WITH LDL/HDL RATIO
Cholesterol, Total: 155 mg/dL (ref 100–199)
HDL: 36 mg/dL — ABNORMAL LOW (ref >39)
LDL Chol Calc (NIH): 79 mg/dL (ref 0–99)
LDL/HDL Ratio: 2.2 ratio (ref 0.0–3.2)
Triglycerides: 238 mg/dL — ABNORMAL HIGH (ref 0–149)
VLDL Cholesterol Cal: 40 mg/dL (ref 5–40)

## 2020-11-18 DIAGNOSIS — L600 Ingrowing nail: Secondary | ICD-10-CM | POA: Diagnosis not present

## 2020-11-18 DIAGNOSIS — B351 Tinea unguium: Secondary | ICD-10-CM | POA: Diagnosis not present

## 2020-11-18 DIAGNOSIS — M79675 Pain in left toe(s): Secondary | ICD-10-CM | POA: Diagnosis not present

## 2020-11-18 DIAGNOSIS — M79674 Pain in right toe(s): Secondary | ICD-10-CM | POA: Diagnosis not present

## 2020-11-18 DIAGNOSIS — E1142 Type 2 diabetes mellitus with diabetic polyneuropathy: Secondary | ICD-10-CM | POA: Diagnosis not present

## 2020-11-26 ENCOUNTER — Encounter: Payer: Self-pay | Admitting: Family Medicine

## 2020-11-26 ENCOUNTER — Other Ambulatory Visit: Payer: Self-pay

## 2020-11-26 ENCOUNTER — Other Ambulatory Visit (HOSPITAL_BASED_OUTPATIENT_CLINIC_OR_DEPARTMENT_OTHER): Payer: Self-pay

## 2020-11-26 ENCOUNTER — Ambulatory Visit: Payer: Medicare PPO | Admitting: Family Medicine

## 2020-11-26 VITALS — BP 120/68 | HR 60 | Ht 60.0 in | Wt 187.0 lb

## 2020-11-26 DIAGNOSIS — K5900 Constipation, unspecified: Secondary | ICD-10-CM | POA: Diagnosis not present

## 2020-11-26 DIAGNOSIS — M5416 Radiculopathy, lumbar region: Secondary | ICD-10-CM

## 2020-11-26 MED ORDER — DOCUSATE SODIUM 50 MG PO CAPS: 50.0000 mg | ORAL_CAPSULE | Freq: Every day | ORAL | 11 refills | Status: AC

## 2020-11-26 NOTE — Patient Instructions (Signed)

## 2020-11-26 NOTE — Progress Notes (Signed)
Date:  11/26/2020   Name:  Sara Carr   DOB:  1946/01/01   MRN:  [id number]   Chief Complaint: Back Pain (R) lower back pain- worse when doing activities such as bending over and picking things up. Saw Dr Lacinda Axon last June and they did a laminectomy)  Back Pain This is a chronic problem. The current episode started more than 1 year ago. The problem occurs rarely. The problem has been gradually improving since onset. The pain is present in the lumbar spine. The quality of the pain is described as aching. The pain is moderate. The symptoms are aggravated by bending. Stiffness is present in the morning. Pertinent negatives include no abdominal pain, bladder incontinence, bowel incontinence, chest pain, dysuria, fever, headaches, leg pain, numbness, paresis, paresthesias, pelvic pain, perianal numbness, tingling or weakness. Treatments tried: gabapentin/  Constipation This is a chronic problem. The current episode started more than 1 month ago. Associated symptoms include back pain. Pertinent negatives include no abdominal pain, diarrhea, fever, nausea or vomiting.    Lab Results  Component Value Date   CREATININE 1.14 (H) 11/15/2020   BUN 24 11/15/2020   NA 143 11/15/2020   K 5.4 (H) 11/15/2020   CL 105 11/15/2020   CO2 21 11/15/2020   Lab Results  Component Value Date   CHOL 155 11/15/2020   HDL 36 (L) 11/15/2020   LDLCALC 79 11/15/2020   TRIG 238 (H) 11/15/2020   No results found for: TSH Lab Results  Component Value Date   HGBA1C 8.5 (H) 01/22/2020   Lab Results  Component Value Date   WBC 9.2 11/15/2020   HGB 14.1 11/15/2020   HCT 45.4 11/15/2020   MCV 88 11/15/2020   PLT 288 11/15/2020   Lab Results  Component Value Date   ALT 16 05/20/2018   AST 16 05/20/2018     Review of Systems  Constitutional: Negative.  Negative for chills, fatigue, fever and unexpected weight change.  HENT: Negative for congestion, ear discharge, ear pain, rhinorrhea, sinus pressure,  sneezing and sore throat.   Eyes: Negative for photophobia, pain, discharge, redness and itching.  Respiratory: Negative for cough, shortness of breath, wheezing and stridor.   Cardiovascular: Negative for chest pain.  Gastrointestinal: Positive for constipation. Negative for abdominal pain, blood in stool, bowel incontinence, diarrhea, nausea and vomiting.  Endocrine: Negative for cold intolerance, heat intolerance, polydipsia, polyphagia and polyuria.  Genitourinary: Negative for bladder incontinence, dysuria, flank pain, frequency, hematuria, menstrual problem, pelvic pain, urgency, vaginal bleeding and vaginal discharge.  Musculoskeletal: Positive for back pain. Negative for arthralgias and myalgias.  Skin: Negative for rash.  Allergic/Immunologic: Negative for environmental allergies and food allergies.  Neurological: Negative for dizziness, tingling, weakness, light-headedness, numbness, headaches and paresthesias.  Hematological: Negative for adenopathy. Does not bruise/bleed easily.  Psychiatric/Behavioral: Negative for dysphoric mood. The patient is not nervous/anxious.     Patient Active Problem List   Diagnosis Date Noted  . Lumbar radiculopathy 01/22/2020    Allergies  Allergen Reactions  . Penicillins Hives and Swelling    Past Surgical History:  Procedure Laterality Date  . BACK SURGERY    . CHOLECYSTECTOMY  1979  . JOINT REPLACEMENT Bilateral 2017  . LUMBAR LAMINECTOMY/ DECOMPRESSION WITH MET-RX N/A 01/22/2020   Procedure: L3-4 laminectomy;  Surgeon: [name], MD;  Location: [facility] ORS;  Service: Neurosurgery;  Laterality: N/A;  . REPLACEMENT TOTAL KNEE Bilateral   . SPINE SURGERY  2004    Social History   Tobacco  Use  . Smoking status: Never Smoker  . Smokeless tobacco: Never Used  . Tobacco comment: none  Vaping Use  . Vaping Use: Never used  Substance Use Topics  . Alcohol use: Never  . Drug use: Never     Medication list has been reviewed and  updated.  Current Meds  Medication Sig  . acetaminophen (TYLENOL) 325 MG tablet Take 2 tablets (650 mg total) by mouth every 4 (four) hours as needed for mild pain ((score 1 to 3) or temp > 100.5).  Marland Kitchen atorvastatin (LIPITOR) 40 MG tablet Take 1 tablet (40 mg total) by mouth daily.  . calcium carbonate (TUMS - DOSED IN MG ELEMENTAL CALCIUM) 500 MG chewable tablet Chew 1 tablet by mouth daily as needed for indigestion or heartburn.  . carvedilol (COREG) 6.25 MG tablet Take 1 tablet (6.25 mg total) by mouth daily.  . Continuous Blood Gluc Sensor (FREESTYLE LIBRE 14 DAY SENSOR) MISC   . COVID-19 mRNA vaccine, Pfizer, 30 MCG/0.3ML injection INJECT AS DIRECTED  . Dulaglutide (TRULICITY) 4.49 QP/5.9FM SOPN Inject 0.75 mg into the skin once a week. Takes on Saturdays  . gabapentin (NEURONTIN) 800 MG tablet Take 800 mg by mouth 2 (two) times daily as needed (pain). [place]  . INVOKAMET XR 150-500 MG TB24 Take 1 tablet by mouth in the morning and at bedtime.  . lansoprazole (PREVACID) 30 MG capsule TAKE 1 CAPSULE DAILY AT 12 NOON.  Marland Kitchen levothyroxine (SYNTHROID) 75 MCG tablet Take 75 mcg by mouth daily before breakfast. Takes 75 mcg tues-Sunday and takes 112.5 mcf (1.5 tabs) on Monday  . losartan (COZAAR) 25 MG tablet Take 1 tablet (25 mg total) by mouth daily.  . Multiple Vitamins-Minerals (ONE-A-DAY VITACRAVES ADULT) CHEW Chew 1 tablet by mouth daily. Patient take gummy  . oxybutynin (DITROPAN) 5 MG/5ML syrup Take 5 mLs (5 mg total) by mouth 2 (two) times daily.  . Semaglutide (OZEMPIC, 0.25 OR 0.5 MG/DOSE, [place]) Inject 0.75 mg into the skin once a week.  . sertraline (ZOLOFT) 50 MG tablet Take 1 tablet (50 mg total) by mouth daily.  Marland Kitchen triamcinolone cream (KENALOG) 0.1 % Apply 1 application topically 2 (two) times daily.    PHQ 2/9 Scores 11/15/2020 08/07/2020 05/30/2020 11/28/2019  PHQ - 2 Score 0 0 0 6  PHQ- 9 Score 4 - 0 8    GAD 7 : Generalized Anxiety Score 11/15/2020 05/30/2020 11/28/2019 11/23/2019   Nervous, Anxious, on Edge 1 0 0 0  Control/stop worrying 0 0 0 0  Worry too much - different things 0 0 0 0  Trouble relaxing 0 0 0 0  Restless 0 0 0 0  Easily annoyed or irritable 2 0 0 3  Afraid - awful might happen 2 0 0 0  Total GAD 7 Score 5 0 0 3  Anxiety Difficulty Not difficult at all - - Not difficult at all    BP Readings from Last 3 Encounters:  11/26/20 120/68  11/15/20 124/78  08/07/20 122/80    Physical Exam Vitals and nursing note reviewed.  Constitutional:      Appearance: She is well-developed.  HENT:     Head: Normocephalic.     Right Ear: Tympanic membrane, ear canal and external ear normal.     Left Ear: Tympanic membrane, ear canal and external ear normal.  Eyes:     General: Lids are everted, no foreign bodies appreciated. No scleral icterus.       Left eye: No foreign body or  hordeolum.     Conjunctiva/sclera: Conjunctivae normal.     Right eye: Right conjunctiva is not injected.     Left eye: Left conjunctiva is not injected.     Pupils: Pupils are equal, round, and reactive to light.  Neck:     Thyroid: No thyromegaly.     Vascular: No JVD.     Trachea: No tracheal deviation.  Cardiovascular:     Rate and Rhythm: Normal rate and regular rhythm.     Heart sounds: Normal heart sounds. No murmur heard. No friction rub. No gallop.   Pulmonary:     Effort: Pulmonary effort is normal. No respiratory distress.     Breath sounds: Normal breath sounds. No wheezing, rhonchi or rales.  Abdominal:     General: Bowel sounds are normal.     Palpations: Abdomen is soft. There is no hepatomegaly, splenomegaly or mass.     Tenderness: There is no abdominal tenderness. There is no guarding or rebound.  Musculoskeletal:        General: No tenderness. Normal range of motion.     Cervical back: Normal range of motion and neck supple.  Lymphadenopathy:     Cervical: No cervical adenopathy.  Skin:    General: Skin is warm.     Findings: No rash.   Neurological:     Mental Status: She is alert and oriented to person, place, and time.     Cranial Nerves: No cranial nerve deficit.     Deep Tendon Reflexes: Reflexes normal.  Psychiatric:        Mood and Affect: Mood is not anxious or depressed.     Wt Readings from Last 3 Encounters:  11/26/20 187 lb (84.8 kg)  11/15/20 187 lb (84.8 kg)  08/07/20 184 lb 12.8 oz (83.8 kg)    BP 120/68   Pulse 60   Ht 5' (1.524 m)   Wt 187 lb (84.8 kg)   BMI 36.52 kg/m   Assessment and Plan: 1. Lumbar radiculopathy Chronic.  Relatively controlled.  Patient had radiculopathy corrected by laminectomy per Dr. Lacinda Axon.  Patient is doing relatively better although still has some back pain which is to be expected given her level of arthritis.  This was discussed with patient and she is already taking gabapentin for nerve discomfort.  I have suggested that she may use some Tylenol on a as needed basis if there is breakthrough pain during the day and continued her gabapentin as prescribed by other physicians.  2. Constipation, unspecified constipation type Patient's had increasing constipation to the point that it is once to twice a week rather than daily.  I have explained to her that it is unlikely that she is going to have bowel movements on a daily basis as we get older and her colon transit time increases.  Patient will be prescribed some stool softener once a day as well as been given information on eating foods that will facilitate bowel movements.  If there is change in the caliber of the stool or any blood is noted she is to contact us for further evaluation.

## 2020-12-02 ENCOUNTER — Other Ambulatory Visit: Payer: Self-pay | Admitting: Family Medicine

## 2020-12-02 DIAGNOSIS — N3941 Urge incontinence: Secondary | ICD-10-CM

## 2020-12-02 NOTE — Telephone Encounter (Signed)
Requested medication (s) are due for refill today:   Pharmacy has a request  Requested medication (s) are on the active medication list:   Yes  Future visit scheduled:   Yes   Last ordered: 11/15/2020 120 ml, 1 refill  Returned because pharmacy requesting 240 ml refill.     Requested Prescriptions  Pending Prescriptions Disp Refills   oxybutynin (DITROPAN) 5 MG/5ML syrup [Pharmacy Med Name: OXYBUTYNIN CHLORIDE 5 MG/5ML Syrup] 240 mL     Sig: TAKE 5 MLS (5 MG TOTAL)  2  TIMES DAILY.      Urology:  Bladder Agents Passed - 12/02/2020 11:20 AM      Passed - Valid encounter within last 12 months    Recent Outpatient Visits           6 days ago Lumbar radiculopathy   [facility] Duanne Limerick, MD   2 weeks ago Essential hypertension   [facility] Duanne Limerick, MD   6 months ago Essential hypertension   [facility] Duanne Limerick, MD   1 year ago Essential hypertension   [facility] Duanne Limerick, MD   1 year ago Urinary frequency   [facility] Duanne Limerick, MD       Future Appointments             In 5 months Duanne Limerick, MD [facility], [facility]

## 2020-12-09 DIAGNOSIS — L600 Ingrowing nail: Secondary | ICD-10-CM | POA: Diagnosis not present

## 2020-12-09 DIAGNOSIS — M79674 Pain in right toe(s): Secondary | ICD-10-CM | POA: Diagnosis not present

## 2020-12-09 DIAGNOSIS — M79675 Pain in left toe(s): Secondary | ICD-10-CM | POA: Diagnosis not present

## 2020-12-09 DIAGNOSIS — E1142 Type 2 diabetes mellitus with diabetic polyneuropathy: Secondary | ICD-10-CM | POA: Diagnosis not present

## 2020-12-24 ENCOUNTER — Telehealth: Payer: Self-pay | Admitting: Pharmacist

## 2020-12-24 NOTE — Chronic Care Management (AMB) (Signed)
Chronic Care Management Pharmacy Assistant   Name: Sara Carr  MRN: [id number] DOB: [birth year]  Reason for Encounter: Disease State-Diabetes Mellitus    Recent office visits:  11/26/20- Sara Sauer, MD(PCP)-Back pain R lower,start docusate sodium 50 mg daily  04/01/22[name], MD(PCP)-follow up  Recent consult visits:  11/18/20- Sara Carr [facility] visits:  None in previous 6 months  Medications: Outpatient Encounter Medications as of 12/24/2020  Medication Sig  . acetaminophen (TYLENOL) 325 MG tablet Take 2 tablets (650 mg total) by mouth every 4 (four) hours as needed for mild pain ((score 1 to 3) or temp > 100.5).  Marland Kitchen atorvastatin (LIPITOR) 40 MG tablet Take 1 tablet (40 mg total) by mouth daily.  . calcium carbonate (TUMS - DOSED IN MG ELEMENTAL CALCIUM) 500 MG chewable tablet Chew 1 tablet by mouth daily as needed for indigestion or heartburn.  . carvedilol (COREG) 6.25 MG tablet Take 1 tablet (6.25 mg total) by mouth daily.  . Continuous Blood Gluc Sensor (FREESTYLE LIBRE 14 DAY SENSOR) MISC   . COVID-19 mRNA vaccine, Pfizer, 30 MCG/0.3ML injection INJECT AS DIRECTED  . docusate sodium (COLACE) 50 MG capsule Take 1 capsule (50 mg total) by mouth daily.  . Dulaglutide (TRULICITY) 0.75 MG/0.5ML SOPN Inject 0.75 mg into the skin once a week. Takes on Saturdays  . gabapentin (NEURONTIN) 800 MG tablet Take 800 mg by mouth 2 (two) times daily as needed (pain). [place]  . INVOKAMET XR 150-500 MG TB24 Take 1 tablet by mouth in the morning and at bedtime.  . lansoprazole (PREVACID) 30 MG capsule TAKE 1 CAPSULE DAILY AT 12 NOON.  Marland Kitchen levothyroxine (SYNTHROID) 75 MCG tablet Take 75 mcg by mouth daily before breakfast. Takes 75 mcg tues-Sunday and takes 112.5 mcf (1.5 tabs) on Monday  . losartan (COZAAR) 25 MG tablet Take 1 tablet (25 mg total) by mouth daily.  . Multiple Vitamins-Minerals (ONE-A-DAY VITACRAVES ADULT) CHEW Chew 1 tablet by mouth daily. Patient  take gummy  . oxybutynin (DITROPAN) 5 MG/5ML syrup Take 5 mLs (5 mg total) by mouth 2 (two) times daily.  . Semaglutide (OZEMPIC, 0.25 OR 0.5 MG/DOSE, [place]) Inject 0.75 mg into the skin once a week.  . sertraline (ZOLOFT) 50 MG tablet Take 1 tablet (50 mg total) by mouth daily.  Marland Kitchen triamcinolone cream (KENALOG) 0.1 % Apply 1 application topically 2 (two) times daily.   No facility-administered encounter medications on file as of 12/24/2020.    Recent Relevant Labs: Lab Results  Component Value Date/Time   HGBA1C 8.5 (H) 01/22/2020 12:12 PM   HGBA1C 7.8 11/03/2019 12:00 AM   HGBA1C 7.5 08/18/2018 12:00 AM   MICROALBUR 70 08/18/2018 12:00 AM    Kidney Function Lab Results  Component Value Date/Time   CREATININE 1.14 (H) 11/15/2020 11:38 AM   CREATININE 0.82 01/18/2020 08:39 AM   GFRNONAA >60 01/18/2020 08:39 AM   GFRAA >60 01/18/2020 08:39 AM    . Current antihyperglycemic regimen:   . What recent interventions/DTPs have been made to improve glycemic control:  ? Trulicity 0.75 mg qd ? Invokamet 150/500 mg bid  . Have there been any recent hospitalizations or ED visits since last visit with CPP? No   . Patient denies hypoglycemic symptoms, including Pale, Sweaty, Shaky, Hungry, Nervous/irritable and Vision changes   . Patient denies hyperglycemic symptoms, including blurry vision, excessive thirst, fatigue, polyuria and weakness   . How often are you checking your blood sugar? 7-8 times per day, patient has  the [place].  . What are your blood sugars ranging?  o Fasting:  o Before meals:  o After meals: 154 o Bedtime:   . During the week, how often does your blood glucose drop below 70? Never   . Are you checking your feet daily/regularly?  o Patient states she checks her feet daily.She states she has an appointment tomorrow to have an ingrown toe nail removed.  Adherence Review: Is the patient currently on a STATIN medication? Yes Is the patient currently on ACE/ARB  medication? Yes Does the patient have >5 day gap between last estimated fill dates? Yes   Patient states she did not receive a blood pressure monitor.   Star Rating Drugs: Atorvastatin 40 mg last filled last filled 03/04/20 90 DS Trulicity 0.75 mg  Losartan 25 mg last filled 03/04/20 90 DS ozempic 0.75 mg last filled 11/24/19 84 DS  [facility] Clinical Pharmacist Assistant [phone]

## 2020-12-25 DIAGNOSIS — M79675 Pain in left toe(s): Secondary | ICD-10-CM | POA: Diagnosis not present

## 2020-12-25 DIAGNOSIS — E1142 Type 2 diabetes mellitus with diabetic polyneuropathy: Secondary | ICD-10-CM | POA: Diagnosis not present

## 2020-12-25 DIAGNOSIS — B351 Tinea unguium: Secondary | ICD-10-CM | POA: Diagnosis not present

## 2020-12-25 DIAGNOSIS — M79674 Pain in right toe(s): Secondary | ICD-10-CM | POA: Diagnosis not present

## 2020-12-25 DIAGNOSIS — L600 Ingrowing nail: Secondary | ICD-10-CM | POA: Diagnosis not present

## 2020-12-30 DIAGNOSIS — E1165 Type 2 diabetes mellitus with hyperglycemia: Secondary | ICD-10-CM | POA: Diagnosis not present

## 2020-12-30 DIAGNOSIS — E063 Autoimmune thyroiditis: Secondary | ICD-10-CM | POA: Diagnosis not present

## 2020-12-30 DIAGNOSIS — E782 Mixed hyperlipidemia: Secondary | ICD-10-CM | POA: Diagnosis not present

## 2021-01-10 DIAGNOSIS — B351 Tinea unguium: Secondary | ICD-10-CM | POA: Diagnosis not present

## 2021-01-10 DIAGNOSIS — E1142 Type 2 diabetes mellitus with diabetic polyneuropathy: Secondary | ICD-10-CM | POA: Diagnosis not present

## 2021-01-10 DIAGNOSIS — M79675 Pain in left toe(s): Secondary | ICD-10-CM | POA: Diagnosis not present

## 2021-01-10 DIAGNOSIS — L600 Ingrowing nail: Secondary | ICD-10-CM | POA: Diagnosis not present

## 2021-01-10 DIAGNOSIS — M79674 Pain in right toe(s): Secondary | ICD-10-CM | POA: Diagnosis not present

## 2021-01-15 DIAGNOSIS — E063 Autoimmune thyroiditis: Secondary | ICD-10-CM | POA: Diagnosis not present

## 2021-01-21 ENCOUNTER — Ambulatory Visit (INDEPENDENT_AMBULATORY_CARE_PROVIDER_SITE_OTHER): Payer: Medicare PPO | Admitting: Pharmacist

## 2021-01-21 DIAGNOSIS — I10 Essential (primary) hypertension: Secondary | ICD-10-CM | POA: Diagnosis not present

## 2021-01-21 DIAGNOSIS — E1140 Type 2 diabetes mellitus with diabetic neuropathy, unspecified: Secondary | ICD-10-CM

## 2021-01-21 NOTE — Progress Notes (Signed)
Chronic Care Management Pharmacy Note  01/21/2021 Name:  Sara Carr MRN:  [id number] DOB:  01/02/1946  Subjective: Sara Carr is an 75 y.o. year old female who is a primary patient of Juline Patch, MD.  The CCM team was consulted for assistance with disease management and care coordination needs.    Engaged with patient by telephone for follow up visit in response to provider referral for pharmacy case management and/or care coordination services.   Consent to Services:  The patient was given information about Chronic Care Management services, agreed to services, and gave verbal consent prior to initiation of services.  Please see initial visit note for detailed documentation.   Patient Care Team: Juline Patch, MD as PCP - General (Family Medicine) Warnell Forester, NP as Nurse Practitioner (Endocrinology) Albertine Patricia, [place] as Attending Physician (Podiatry) Vladimir Faster, [facility] (Pharmacist)  Recent office visits: 4/12/22Ronnald Ramp (PCP) - lumbar radiculopathy- tylenol prn, constipation - docuste 11/15/20- Jones(PCP)-labs no med changes SCR 1.14 egfr 50  Recent consult visits: 6/01/22Pasty Arch, PA- endo- restart Invokamet 150/500 2 tabs daily change levo tho 46mg /day 01/10/21-Baker(podiatry)-follow up form rt hallux medial nail border procedure 10/02/20- blackwood, NP(endo)-  Continue Invokamet, change to Trulicity, increase levothyroxine 765m qd  by 1/2 tab on Mondays  Hospital visits: None in previous 6 months  Objective:  Lab Results  Component Value Date   CREATININE 1.14 (H) 11/15/2020   BUN 24 11/15/2020   GFRNONAA >60 01/18/2020   GFRAA >60 01/18/2020   NA 143 11/15/2020   K 5.4 (H) 11/15/2020   CALCIUM 9.5 11/15/2020   CO2 21 11/15/2020    Lab Results  Component Value Date/Time   HGBA1C 8.5 (H) 01/22/2020 12:12 PM   HGBA1C 7.8 11/03/2019 12:00 AM   HGBA1C 7.5 08/18/2018 12:00 AM   MICROALBUR 70 08/18/2018 12:00 AM    Last diabetic Eye exam:  No results found for: HMDIABEYEEXA  Last diabetic Foot exam: No results found for: HMDIABFOOTEX   Lab Results  Component Value Date   CHOL 155 11/15/2020   HDL 36 (L) 11/15/2020   LDLCALC 79 11/15/2020   TRIG 238 (H) 11/15/2020    Hepatic Function Latest Ref Rng & Units 11/15/2020 11/28/2019 05/20/2018  Albumin 3.7 - 4.7 g/dL 4.3 4.6 -  AST 13 - 35 - - 16  ALT 7 - 35 - - 16    No results found for: TSH, FREET4  CBC Latest Ref Rng & Units 11/15/2020 01/18/2020 05/20/2018  WBC 3.4 - 10.8 x10E3/uL 9.2 6.3 -  Hemoglobin 11.1 - 15.9 g/dL 14.1 12.8 13.2  Hematocrit 34.0 - 46.6 % 45.4 41.0 42  Platelets 150 - 450 x10E3/uL 288 233 280    No results found for: VD25OH  Clinical ASCVD: No  The 10-year ASCVD risk score (GMikey BussingC Jr., et al., 2013) is: 29.5%   Values used to calculate the score:     Age: [age]     Sex: Female     Is Non-Hispanic African American: No     Diabetic: Yes     Tobacco smoker: No     Systolic Blood Pressure: 10390mHg     Is BP treated: Yes     HDL Cholesterol: 36 mg/dL     Total Cholesterol: 155 mg/dL    [facility]9 11/15/2020 08/07/2020 05/30/2020  Decreased Interest 0 0 0  Down, Depressed, Hopeless 0 0 0  PHQ - 2 Score 0 0 0  Altered sleeping  1 - 0  Tired, decreased energy 3 - 0  Change in appetite 0 - 0  Feeling bad or failure about yourself  0 - 0  Trouble concentrating 0 - 0  Moving slowly or fidgety/restless 0 - 0  Suicidal thoughts 0 - 0  PHQ-9 Score 4 - 0  Difficult doing work/chores Somewhat difficult - -      Social History   Tobacco Use  Smoking Status Never Smoker  Smokeless Tobacco Never Used  Tobacco Comment   none   BP Readings from Last 3 Encounters:  11/26/20 120/68  11/15/20 124/78  08/07/20 122/80   Pulse Readings from Last 3 Encounters:  11/26/20 60  11/15/20 87  08/07/20 (!) 102   Wt Readings from Last 3 Encounters:  11/26/20 187 lb (84.8 kg)  11/15/20 187 lb (84.8 kg)  08/07/20 184 lb 12.8 oz (83.8  kg)    Assessment/Interventions: Review of patient past medical history, allergies, medications, health status, including review of consultants reports, laboratory and other test data, was performed as part of comprehensive evaluation and provision of chronic care management services.   SDOH:  (Social Determinants of Health) assessments and interventions performed: No     Immunization History  Administered Date(s) Administered  . Fluad Quad(high Dose 65+) 06/27/2019, 05/30/2020  . Influenza-Unspecified 10/14/2006, 07/13/2016  . PFIZER(Purple Top)SARS-COV-2 Vaccination 08/29/2019, 09/16/2019, 06/10/2020  . Pneumococcal Conjugate-13 06/27/2019  . Pneumococcal Polysaccharide-23 07/13/2016  . Tdap 11/16/2015  . Zoster, Live 02/27/2011    Conditions to be addressed/monitored:  Hypertension, Hyperlipidemia, Diabetes, GERD, Hypothyroidism, Depression and Overactive Bladder  Care Plan : [place]  Updates made by Vladimir Faster, [place] since 01/21/2021 12:00 AM    Problem: DM, HTN, HLD, Hypothyroidism, osteopenia   Priority: High    Long-Range Goal: Disease Management   Start Date: 10/31/2020  This Visit's Progress: Not on track  Recent Progress: On track  Priority: High  Note:   Current Barriers:  . Unable to independently monitor therapeutic efficacy . Unable to maintain control of diabetes, hypothyroidism . Unable to self administer medications as prescribed  Pharmacist Clinical Goal(s):  Marland Kitchen Patient will achieve adherence to monitoring guidelines and medication adherence to achieve therapeutic efficacy . maintain control of diabetes, htyroid function as evidenced by lab values and SMBG  . adhere to prescribed medication regimen as evidenced by fill dates through collaboration with PharmD and provider.   Interventions: . 1:1 collaboration with Juline Patch, MD regarding development and update of comprehensive plan of care as evidenced by provider attestation and  co-signature . Inter-disciplinary care team collaboration (see longitudinal plan of care) . Comprehensive medication review performed; medication list updated in electronic medical record .  BP Readings from Last 3 Encounters:  11/26/20 120/68  11/15/20 124/78  08/07/20 122/80    Hypertension (BP goal <130/80) -Controlled -Current treatment: . Losartan 25 mg qd . Carvedilol 6.25 mg qd -Medications previously tried: NA -Current home readings: Doesn't check. Does not have cuff. --Denies hypotensive/hypertensive symptoms -Educated on BP goals and benefits of medications for prevention of heart attack, stroke and kidney damage; Daily salt intake goal < 2300 mg; Exercise goal of 150 minutes per week; Importance of home blood pressure monitoring; Symptoms of hypotension and importance of maintaining adequate hydration; -Counseled to call [employer] and ask about OTC benefits or purchase a BP cuff, monitor BP at home wekly document, and provide log at future appointments -Counseled on diet and exercise extensively Recommended to continue current medication  Recommended adding Hypertension diagnosis to chart.  Lab Results  Component Value Date   CHOL 155 11/15/2020   HDL 36 (L) 11/15/2020   LDLCALC 79 11/15/2020   TRIG 238 (H) 11/15/2020   Lab Results  Component Value Date   CREATININE 1.14 (H) 11/15/2020   GFR~ 50 ml/min Hyperlipidemia: (LDL goal < 70) -Not ideally controlled -Current treatment: . Atorvastatin 40 mg qd -Medications previously tried: NA  --Educated on Cholesterol goals;  Benefits of statin for ASCVD risk reduction; Importance of limiting foods high in cholesterol; Exercise goal of 150 minutes per week; -Counseled on diet and exercise extensively Recommended to continue current medication    HBA1c 7.9 % 12/30/20  Diabetes (A1c goal <7%) -Uncontrolled -Current medications: o Trulicity 0.96 mg qweek (Friday) o Invokamet 150/500 mg bid (resumed bid 01/15/21,  had been taking daily) -Medications previously tried: Ozempic- cost -Current home glucose readings fasting glucose: 136 this am. Patient was traveling and didn't have reading readily available. Meter interogated at endo 01/15/21 showedFasting blood sugars range 134-210 Midday blood sugars range 194-212 Blood sugars before bed range 175-225  -Denies hypoglycemic/hyperglycemic symptoms -Current meal patterns: No structured diet, eating increased fst food recently due to housing construction and movig process -Current exercise:  Walks dog but no structured regimen -Educated on A1c and blood sugar goals; Complications of diabetes including kidney damage, retinal damage, and cardiovascular disease; Exercise goal of 150 minutes per week; Prevention and management of hypoglycemic episodes; Benefits of routine self-monitoring of blood sugar; Counseled to check feet daily and get yearly eye exams -Counseled to check feet daily and get yearly eye exams -Counseled on diet and exercise extensively Recommended to continue current medication - Recommend consider changing Invokamet to Xigduo given osteopenia on recent DEXA and increased fracture risk with Invokanna.  TSH 6.059 12/30/20 with ENDO Hypothyroidisim (Goal: prevention of adverse effects and symptoms) -Uncontrolled -Current treatment  . Levothyroxine 77mg daily plus extra half dose on Monday (adjusted to 88 mcg qd on 01/15/21 but patient has not picked up per her report) -Medications previously tried: NA Educated on levothyroxine is a tablet not a liquid as stated at endo office visit. Patient is prescribed oxybutynin liquid. Counseled patient to pick up new dose and begin taking.    Patient Goals/Self-Care Activities . Patient will:  - take medications as prescribed check glucose 6-8 times daily as instructied by endo, document, and provide at future appointments collaborate with provider on medication access solutions target a minimum of  150 minutes of moderate intensity exercise weekly engage in dietary modifications by reducing sodium intake, preparing melas at home  Follow Up Plan: Telephone follow up appointment with care management team member scheduled for: 3 months PharmD, 4-6 weeks CPA DM Assessment              Medication Assistance: Patient not eligible. Over income  Patient's preferred pharmacy is:  [id number] [place], [place] [id number]: [phone]: [address] [facility] [id number]: [phone]: [phone] pill box? Yes Pt endorses 99% compliance  We discussed: Current pharmacy is preferred with insurance plan and patient is satisfied with pharmacy services Patient decided to: Continue current medication management strategy  Care Plan and Follow Up Patient Decision:  Patient agrees to Care Plan and Follow-up.  Plan: Telephone follow up appointment with care management team  member scheduled for:  CPA 16-8 weeks. Pharm D 3 months  Junita Push. Kenton Kingfisher PharmD, [facility] [phone]

## 2021-01-21 NOTE — Patient Instructions (Addendum)
Visit Information  It was a pleasure speaking with you today. Thank you for letting me be part of your clinical team. Please call with any questions or concerns.   Goals Addressed            This Visit's Progress   . PharmD- Monitor and Manage My Blood Sugar-Diabetes Type 2   On track    Timeframe:  Long-Range Goal Priority:  High Start Date:                             Expected End Date:                       Follow Up Date  6- 8 week CPA, 3 month PharmD   - check blood sugar at prescribed times - check blood sugar if I feel it is too high or too low - take the blood sugar meter to all doctor visits    Why is this important?    Checking your blood sugar at home helps to keep it from getting very high or very low.   Writing the results in a diary or log helps the doctor know how to care for you.   Your blood sugar log should have the time, date and the results.   Also, write down the amount of insulin or other medicine that you take.   Other information, like what you ate, exercise done and how you were feeling, will also be helpful.     Notes:     . Track and Manage My Blood Pressure-Hypertension   Not on track    Timeframe:  Short-Term Goal Priority:  High Start Date:                             Expected End Date:                       Follow Up Date 6-8 week CPA 3 month Pharmd   -obtain blood pressure cuff - check blood pressure 3 times per week - write blood pressure results in a log or diary    Why is this important?    You won't feel high blood pressure, but it can still hurt your blood vessels.   High blood pressure can cause heart or kidney problems. It can also cause a stroke.   Making lifestyle changes like losing a little weight or eating less salt will help.   Checking your blood pressure at home and at different times of the day can help to control blood pressure.   If the doctor prescribes medicine remember to take it the way the doctor ordered.    Call the office if you cannot afford the medicine or if there are questions about it.     Notes:        The patient verbalized understanding of instructions, educational materials, and care plan provided today and agreed to receive a mailed copy of patient instructions, educational materials, and care plan.   Telephone follow up appointment with pharmacy team member scheduled for: 1 month CPA  [place] [facility]. Sacheen Arrasmith PharmD, BCPS Clinical Pharmacist [phone]  Hypothyroidism  Hypothyroidism is when the thyroid gland does not make enough of certain hormones (it is underactive). The thyroid gland is a small gland located in the lower front part of the neck, just in front  of the windpipe (trachea). This gland makes hormones that help control how the body uses food for energy (metabolism) as well as how the heart and brain function. These hormones also play a role in keeping your bones strong. When the thyroid is underactive, it produces too little of the hormones thyroxine (T4) and triiodothyronine (T3). What are the causes? This condition may be caused by:  Hashimoto's disease. This is a disease in which the body's disease-fighting system (immune system) attacks the thyroid gland. This is the most common cause.  Viral infections.  Pregnancy.  Certain medicines.  Birth defects.  Past radiation treatments to the head or neck for cancer.  Past treatment with radioactive iodine.  Past exposure to radiation in the environment.  Past surgical removal of part or all of the thyroid.  Problems with a gland in the center of the brain (pituitary gland).  Lack of enough iodine in the diet. What increases the risk? You are more likely to develop this condition if:  You are female.  You have a family history of thyroid conditions.  You use a medicine called lithium.  You take medicines that affect the immune system (immunosuppressants). What are the signs or symptoms? Symptoms of  this condition include:  Feeling as though you have no energy (lethargy).  Not being able to tolerate cold.  Weight gain that is not explained by a change in diet or exercise habits.  Lack of appetite.  Dry skin.  Coarse hair.  Menstrual irregularity.  Slowing of thought processes.  Constipation.  Sadness or depression. How is this diagnosed? This condition may be diagnosed based on:  Your symptoms, your medical history, and a physical exam.  Blood tests. You may also have imaging tests, such as an ultrasound or MRI. How is this treated? This condition is treated with medicine that replaces the thyroid hormones that your body does not make. After you begin treatment, it may take several weeks for symptoms to go away. Follow these instructions at home:  Take over-the-counter and prescription medicines only as told by your health care provider.  If you start taking any new medicines, tell your health care provider.  Keep all follow-up visits as told by your health care provider. This is important. ? As your condition improves, your dosage of thyroid hormone medicine may change. ? You will need to have blood tests regularly so that your health care provider can monitor your condition. Contact a health care provider if:  Your symptoms do not get better with treatment.  You are taking thyroid hormone replacement medicine and you: ? Sweat a lot. ? Have tremors. ? Feel anxious. ? Lose weight rapidly. ? Cannot tolerate heat. ? Have emotional swings. ? Have diarrhea. ? Feel weak. Get help right away if you have:  Chest pain.  An irregular heartbeat.  A rapid heartbeat.  Difficulty breathing. Summary  Hypothyroidism is when the thyroid gland does not make enough of certain hormones (it is underactive).  When the thyroid is underactive, it produces too little of the hormones thyroxine (T4) and triiodothyronine (T3).  The most common cause is Hashimoto's  disease, a disease in which the body's disease-fighting system (immune system) attacks the thyroid gland. The condition can also be caused by viral infections, medicine, pregnancy, or past radiation treatment to the head or neck.  Symptoms may include weight gain, dry skin, constipation, feeling as though you do not have energy, and not being able to tolerate cold.  This condition is  treated with medicine to replace the thyroid hormones that your body does not make. This information is not intended to replace advice given to you by your health care provider. Make sure you discuss any questions you have with your health care provider. Document Revised: 05/03/2020 Document Reviewed: 04/18/2020 Elsevier Patient Education  2021 [employer].

## 2021-02-07 DIAGNOSIS — B351 Tinea unguium: Secondary | ICD-10-CM | POA: Diagnosis not present

## 2021-02-07 DIAGNOSIS — E1142 Type 2 diabetes mellitus with diabetic polyneuropathy: Secondary | ICD-10-CM | POA: Diagnosis not present

## 2021-02-13 ENCOUNTER — Telehealth: Payer: Self-pay | Admitting: Pharmacist

## 2021-02-13 NOTE — Chronic Care Management (AMB) (Signed)
    Chronic Care Management Pharmacy Assistant   Name: Sara Carr  MRN: [id number] DOB: Jun 03, 1946    Reason for Encounter: Chart Review     Medications: Outpatient Encounter Medications as of 02/13/2021  Medication Sig   acetaminophen (TYLENOL) 325 MG tablet Take 2 tablets (650 mg total) by mouth every 4 (four) hours as needed for mild pain ((score 1 to 3) or temp > 100.5).   atorvastatin (LIPITOR) 40 MG tablet Take 1 tablet (40 mg total) by mouth daily.   calcium carbonate (TUMS - DOSED IN MG ELEMENTAL CALCIUM) 500 MG chewable tablet Chew 1 tablet by mouth daily as needed for indigestion or heartburn.   carvedilol (COREG) 6.25 MG tablet Take 1 tablet (6.25 mg total) by mouth daily.   Continuous Blood Gluc Sensor (FREESTYLE LIBRE 14 DAY SENSOR) MISC    COVID-19 mRNA vaccine, Pfizer, 30 MCG/0.3ML injection INJECT AS DIRECTED   docusate sodium (COLACE) 50 MG capsule Take 1 capsule (50 mg total) by mouth daily.   Dulaglutide (TRULICITY) 0.75 MG/0.5ML SOPN Inject 0.75 mg into the skin once a week. Takes on Saturdays   gabapentin (NEURONTIN) 800 MG tablet Take 800 mg by mouth 2 (two) times daily as needed (pain). [facility]   INVOKAMET XR 150-500 MG TB24 Take 1 tablet by mouth in the morning and at bedtime.   lansoprazole (PREVACID) 30 MG capsule TAKE 1 CAPSULE DAILY AT 12 NOON.   levothyroxine (SYNTHROID) 75 MCG tablet Take 75 mcg by mouth daily before breakfast. Takes 75 mcg tues-Sunday and takes 112.5 mcf (1.5 tabs) on Monday   losartan (COZAAR) 25 MG tablet Take 1 tablet (25 mg total) by mouth daily.   Multiple Vitamins-Minerals (ONE-A-DAY VITACRAVES ADULT) CHEW Chew 1 tablet by mouth daily. Patient take gummy   oxybutynin (DITROPAN) 5 MG/5ML syrup Take 5 mLs (5 mg total) by mouth 2 (two) times daily.   sertraline (ZOLOFT) 50 MG tablet Take 1 tablet (50 mg total) by mouth daily.   triamcinolone cream (KENALOG) 0.1 % Apply 1 application topically 2 (two) times daily.   No  facility-administered encounter medications on file as of 02/13/2021.    Reviewed chart for medication changes and adherence.  No OVs, Consults, or hospital visits since last care coordination call / Pharmacist visit. No medication changes indicated  No gaps in adherence identified. Patient has follow up scheduled with pharmacy team. No further action required.   Lura Em Clinical Pharmacist Assistant [phone]

## 2021-02-26 DIAGNOSIS — E063 Autoimmune thyroiditis: Secondary | ICD-10-CM | POA: Diagnosis not present

## 2021-03-10 ENCOUNTER — Other Ambulatory Visit: Payer: Self-pay | Admitting: Family Medicine

## 2021-03-10 ENCOUNTER — Telehealth: Payer: Self-pay | Admitting: Family Medicine

## 2021-03-10 ENCOUNTER — Other Ambulatory Visit: Payer: Self-pay

## 2021-03-10 DIAGNOSIS — R748 Abnormal levels of other serum enzymes: Secondary | ICD-10-CM

## 2021-03-10 NOTE — Telephone Encounter (Signed)
Pt called in to make provider aware to look into her lab results with a different provider. Pt says that she was told that there are some concerns with her liver enzymes.   Pt would like to discuss further.    CB: [phone]

## 2021-03-10 NOTE — Progress Notes (Unsigned)
Ordered US of RUQ

## 2021-03-11 ENCOUNTER — Other Ambulatory Visit: Payer: Self-pay

## 2021-03-11 ENCOUNTER — Ambulatory Visit
Admission: RE | Admit: 2021-03-11 | Discharge: 2021-03-11 | Disposition: A | Discharge status: Home / Self Care | Payer: Medicare PPO | Source: Ambulatory Visit | Attending: Family Medicine | Admitting: Family Medicine

## 2021-03-11 DIAGNOSIS — R748 Abnormal levels of other serum enzymes: Secondary | ICD-10-CM | POA: Diagnosis not present

## 2021-03-11 DIAGNOSIS — R945 Abnormal results of liver function studies: Secondary | ICD-10-CM | POA: Diagnosis not present

## 2021-03-12 ENCOUNTER — Other Ambulatory Visit: Payer: Self-pay | Admitting: Family Medicine

## 2021-03-12 DIAGNOSIS — F324 Major depressive disorder, single episode, in partial remission: Secondary | ICD-10-CM

## 2021-03-12 DIAGNOSIS — K219 Gastro-esophageal reflux disease without esophagitis: Secondary | ICD-10-CM

## 2021-03-12 NOTE — Telephone Encounter (Signed)
Requested Prescriptions  Pending Prescriptions Disp Refills  . lansoprazole (PREVACID) 30 MG capsule [Pharmacy Med Name: LANSOPRAZOLE 30 MG Capsule Delayed Release] 90 capsule 0    Sig: TAKE 1 CAPSULE DAILY AT 12 NOON.     Gastroenterology: Proton Pump Inhibitors Passed - 03/12/2021  3:51 AM      Passed - Valid encounter within last 12 months    Recent Outpatient Visits          3 months ago Lumbar radiculopathy   [facility] Duanne Limerick, MD   3 months ago Essential hypertension   [facility] Duanne Limerick, MD   9 months ago Essential hypertension   [facility] Duanne Limerick, MD   1 year ago Essential hypertension   [facility] Duanne Limerick, MD   1 year ago Urinary frequency   [facility] Duanne Limerick, MD      Future Appointments            In 1 month Duanne Limerick, MD [facility]           . sertraline (ZOLOFT) 50 MG tablet [Pharmacy Med Name: SERTRALINE HCL 50 MG Tablet] 90 tablet 0    Sig: TAKE 1 TABLET EVERY DAY     Psychiatry:  Antidepressants - SSRI Passed - 03/12/2021  3:51 AM      Passed - Valid encounter within last 6 months    Recent Outpatient Visits          3 months ago Lumbar radiculopathy   [facility] Duanne Limerick, MD   3 months ago Essential hypertension   [facility] Duanne Limerick, MD   9 months ago Essential hypertension   [facility] Duanne Limerick, MD   1 year ago Essential hypertension   [facility] Duanne Limerick, MD   1 year ago Urinary frequency   [facility] Duanne Limerick, MD      Future Appointments            In 1 month Duanne Limerick, MD [facility], [facility]

## 2021-03-17 ENCOUNTER — Telehealth: Payer: Self-pay | Admitting: Family Medicine

## 2021-03-17 NOTE — Telephone Encounter (Signed)
Pt is calling she had US ABDOMEN LIMITED RUQ Results showed a fatty liver Pt wanted to know what to do. CB- [phone]

## 2021-03-17 NOTE — Telephone Encounter (Signed)
Called patient. Informed the only thing to do is exercise, and work on weight loss. Also, continue to take her daily Lipitor.

## 2021-03-24 ENCOUNTER — Ambulatory Visit: Payer: Medicare PPO | Attending: Internal Medicine

## 2021-03-24 ENCOUNTER — Other Ambulatory Visit (HOSPITAL_BASED_OUTPATIENT_CLINIC_OR_DEPARTMENT_OTHER): Payer: Self-pay

## 2021-03-24 DIAGNOSIS — Z23 Encounter for immunization: Secondary | ICD-10-CM

## 2021-03-24 MED ORDER — COVID-19 MRNA VAC-TRIS(PFIZER) 30 MCG/0.3ML IM SUSP
INTRAMUSCULAR | 0 refills | Status: DC
  Filled 2021-03-24: qty 0.3, 1d supply, fill #0

## 2021-03-24 NOTE — Progress Notes (Signed)
   Covid-19 Vaccination Clinic  Name:  Dariela Stoker    MRN: [id number] DOB: 1945-12-04  03/24/2021  Ms. Lizer was observed post Covid-19 immunization for 15 minutes without incident. She was provided with Vaccine Information Sheet and instruction to access the V-Safe system.   Ms. Harroun was instructed to call 911 with any severe reactions post vaccine: Difficulty breathing  Swelling of face and throat  A fast heartbeat  A bad rash all over body  Dizziness and weakness   Immunizations Administered     Name Date Dose VIS Date Route   PFIZER Comrnaty(Gray TOP) Covid-19 Vaccine 03/24/2021 12:02 PM 0.3 mL 07/25/2020 Intramuscular   Manufacturer: [employer], [employer]   Lot: [id number]   NDC: [phone]

## 2021-04-11 ENCOUNTER — Telehealth: Payer: Self-pay

## 2021-04-11 NOTE — Progress Notes (Signed)
Chronic Care Management Pharmacy Assistant   Name: Quinci Gavidia  MRN: [id number] DOB: Jul 15, 1946   Reason for Encounter: Disease State Diabetes     Recent office visits:  No recent office visits noted.   Recent consult visits:  02/07/21 Rosetta Posner PA - Podiatry - Seen for ingrown toenail - no med changes noted - no follow ups noted   Hospital visits:  None in previous 6 months  Medications: Outpatient Encounter Medications as of 04/11/2021  Medication Sig   acetaminophen (TYLENOL) 325 MG tablet Take 2 tablets (650 mg total) by mouth every 4 (four) hours as needed for mild pain ((score 1 to 3) or temp > 100.5).   atorvastatin (LIPITOR) 40 MG tablet Take 1 tablet (40 mg total) by mouth daily.   calcium carbonate (TUMS - DOSED IN MG ELEMENTAL CALCIUM) 500 MG chewable tablet Chew 1 tablet by mouth daily as needed for indigestion or heartburn.   carvedilol (COREG) 6.25 MG tablet Take 1 tablet (6.25 mg total) by mouth daily.   Continuous Blood Gluc Sensor (FREESTYLE LIBRE 14 DAY SENSOR) MISC    COVID-19 mRNA Vac-TriS, Pfizer, SUSP injection Inject into the muscle.   COVID-19 mRNA vaccine, Pfizer, 30 MCG/0.3ML injection INJECT AS DIRECTED   docusate sodium (COLACE) 50 MG capsule Take 1 capsule (50 mg total) by mouth daily.   Dulaglutide (TRULICITY) 0.75 MG/0.5ML SOPN Inject 0.75 mg into the skin once a week. Takes on Saturdays   gabapentin (NEURONTIN) 800 MG tablet Take 800 mg by mouth 2 (two) times daily as needed (pain). blackwood   INVOKAMET XR 150-500 MG TB24 Take 1 tablet by mouth in the morning and at bedtime.   lansoprazole (PREVACID) 30 MG capsule TAKE 1 CAPSULE DAILY AT 12 NOON.   levothyroxine (SYNTHROID) 75 MCG tablet Take 75 mcg by mouth daily before breakfast. Takes 75 mcg tues-Sunday and takes 112.5 mcf (1.5 tabs) on Monday   losartan (COZAAR) 25 MG tablet Take 1 tablet (25 mg total) by mouth daily.   Multiple Vitamins-Minerals (ONE-A-DAY VITACRAVES ADULT) CHEW Chew 1  tablet by mouth daily. Patient take gummy   oxybutynin (DITROPAN) 5 MG/5ML syrup Take 5 mLs (5 mg total) by mouth 2 (two) times daily.   sertraline (ZOLOFT) 50 MG tablet TAKE 1 TABLET EVERY DAY   triamcinolone cream (KENALOG) 0.1 % Apply 1 application topically 2 (two) times daily.   No facility-administered encounter medications on file as of 04/11/2021.   Recent Relevant Labs: Lab Results  Component Value Date/Time   HGBA1C 8.5 (H) 01/22/2020 12:12 PM   HGBA1C 7.8 11/03/2019 12:00 AM   HGBA1C 7.5 08/18/2018 12:00 AM   MICROALBUR 70 08/18/2018 12:00 AM    Kidney Function Lab Results  Component Value Date/Time   CREATININE 1.14 (H) 11/15/2020 11:38 AM   CREATININE 0.82 01/18/2020 08:39 AM   GFRNONAA >60 01/18/2020 08:39 AM   GFRAA >60 01/18/2020 08:39 AM    Current antihyperglycemic regimen:  Trulicity 0.75 mg  What recent interventions/DTPs have been made to improve glycemic control:  N/a Have there been any recent hospitalizations or ED visits since last visit with CPP? No Patient denies hypoglycemic symptoms, including None Patient reports hyperglycemic symptoms, including polyuria  How often are you checking your blood sugar? Patient states that she has a freestyle libre and only checks it when necessary.  What are your blood sugars ranging?  Fasting: N/a Before meals: n/a After meals: n/a Bedtime: n/a During the week, how often does your blood glucose  drop below 70? Never Are you checking your feet daily/regularly? Patient states that she does check her feet on  a regular basis.   Adherence Review: Is the patient currently on a STATIN medication? Yes Is the patient currently on ACE/ARB medication? Yes Does the patient have >5 day gap between last estimated fill dates? No  Misc. Comment: Ms. Cherly is a very pleasant woman. She states that she is in the process of unpacking boxes and settling into her home. Patient does report that she experiences fatigue but states  that it is because she is unpacking boxes. Per patient she is taking levothyroxine at 75 mg as of 04/14/21. Overall, patient states that she is doing well and appreciates the call.   Care Gaps:  Hepatitis C screening  Shingrix vaccine  Influenza vaccine   Star Rating Drugs: Atorvastatin 40 mg last filled 11/15/20 90 DS Levothyroxine 75 Mcg last filled 01/09/20 unsure of the day supply  Losartan 25 mg last filled 11/15/20 90 DS  Salli Real, CMA

## 2021-04-22 ENCOUNTER — Ambulatory Visit (INDEPENDENT_AMBULATORY_CARE_PROVIDER_SITE_OTHER): Payer: Medicare PPO

## 2021-04-22 DIAGNOSIS — E7801 Familial hypercholesterolemia: Secondary | ICD-10-CM

## 2021-04-22 DIAGNOSIS — E1140 Type 2 diabetes mellitus with diabetic neuropathy, unspecified: Secondary | ICD-10-CM

## 2021-04-22 NOTE — Progress Notes (Signed)
Chronic Care Management Pharmacy Note  04/22/2021 Name:  Sara Carr MRN:  [id number] DOB:  [birth year]  Summary: No Rx changes, does not qualify for patient assistance   Subjective: Sara Carr is an 75 y.o. year old female who is a primary patient of Duanne Limerick, MD.  The CCM team was consulted for assistance with disease management and care coordination needs.    Engaged with patient by telephone for follow up visit in response to provider referral for pharmacy case management and/or care coordination services.   Consent to Services:  The patient was given the following information about Chronic Care Management services today, agreed to services, and gave verbal consent: see initial visit notes for details.   Patient Care Team: Duanne Limerick, MD as PCP - General (Family Medicine) Otelia Sergeant, NP as Nurse Practitioner (Endocrinology) Recardo Evangelist, [place] as Attending Physician (Podiatry) Lajean Manes, [facility] (Pharmacist)  Hospital visits: None in previous 6 months  Objective:  Lab Results  Component Value Date   CREATININE 1.14 (H) 11/15/2020   CREATININE 0.82 01/18/2020   CREATININE 1.08 (H) 11/28/2019    Lab Results  Component Value Date   HGBA1C 8.5 (H) 01/22/2020   HGBA1C 7.8 11/03/2019   HGBA1C 7.5 08/18/2018   Last diabetic Eye exam: No results found for: HMDIABEYEEXA  Last diabetic Foot exam: No results found for: HMDIABFOOTEX      Component Value Date/Time   CHOL 155 11/15/2020 1138   CHOL 124 05/20/2018 0000   TRIG 238 (H) 11/15/2020 1138   TRIG 176 (A) 05/20/2018 0000   HDL 36 (L) 11/15/2020 1138   HDL 35 05/20/2018 0000   LDLCALC 79 11/15/2020 1138   LDLCALC 64 05/20/2018 0000    Hepatic Function Latest Ref Rng & Units 11/15/2020 11/28/2019 05/20/2018  Albumin 3.7 - 4.7 g/dL 4.3 4.6 -  AST 13 - 35 - - 16  ALT 7 - 35 - - 16    No results found for: TSH, FREET4  CBC Latest Ref Rng & Units 11/15/2020 01/18/2020 05/20/2018  WBC 3.4 -  10.8 x10E3/uL 9.2 6.3 -  Hemoglobin 11.1 - 15.9 g/dL 72.5 36.6 44.0  Hematocrit 34.0 - 46.6 % 45.4 41.0 42  Platelets 150 - 450 x10E3/uL 288 233 280    No results found for: VD25OH  Clinical ASCVD:  The 10-year ASCVD risk score Denman George DC Jr., et al., 2013) is: 29.5%   Values used to calculate the score:     Age: 82 years     Sex: Female     Is Non-Hispanic African American: No     Diabetic: Yes     Tobacco smoker: No     Systolic Blood Pressure: 108 mmHg     Is BP treated: Yes     HDL Cholesterol: 36 mg/dL     Total Cholesterol: 155 mg/dL   Social History   Tobacco Use  Smoking Status Never  Smokeless Tobacco Never  Tobacco Comments   none   BP Readings from Last 3 Encounters:  11/26/20 120/68  11/15/20 124/78  08/07/20 122/80   Pulse Readings from Last 3 Encounters:  11/26/20 60  11/15/20 87  08/07/20 (!) 102   Wt Readings from Last 3 Encounters:  11/26/20 187 lb (84.8 kg)  11/15/20 187 lb (84.8 kg)  08/07/20 184 lb 12.8 oz (83.8 kg)   BMI Readings from Last 3 Encounters:  11/26/20 36.52 kg/m  11/15/20 36.52 kg/m  08/07/20 36.09 kg/m    Assessment:  Review of patient past medical history, allergies, medications, health status, including review of consultants reports, laboratory and other test data, was performed as part of comprehensive evaluation and provision of chronic care management services.   SDOH:  (Social Determinants of Health) assessments and interventions performed:    CCM Care Plan  Allergies  Allergen Reactions   Penicillins Hives and Swelling    Medications Reviewed Today     Reviewed by Dahlia Byes, [facility] (Pharmacist) on 04/22/21 at 1353  Med List Status: <None>   Medication Order Taking? Sig Documenting Provider Last Dose Status Informant  acetaminophen (TYLENOL) 325 MG tablet [id number]  Take 2 tablets (650 mg total) by mouth every 4 (four) hours as needed for mild pain ((score 1 to 3) or temp > 100.5). [name], NP  Active    atorvastatin (LIPITOR) 40 MG tablet [id number] Yes Take 1 tablet (40 mg total) by mouth daily. Duanne Limerick, MD  Active   calcium carbonate (TUMS - DOSED IN MG ELEMENTAL CALCIUM) 500 MG chewable tablet [id number]  Chew 1 tablet by mouth daily as needed for indigestion or heartburn. [provider]  Active   carvedilol (COREG) 6.25 MG tablet [id number] Yes Take 1 tablet (6.25 mg total) by mouth daily. Duanne Limerick, MD  Active   Continuous Blood Gluc Sensor (FREESTYLE LIBRE [address] [place]) [place] [id number]   [provider]  Active   COVID-19 mRNA Vac-TriS, Pfizer, SUSP injection [id number]  Inject into the muscle. Judyann Munson, MD  Active   COVID-19 mRNA vaccine, Pfizer, 30 MCG/0.3ML injection [id number]  INJECT AS DIRECTED Judyann Munson, MD  Active   docusate sodium (COLACE) 50 MG capsule [id number] Yes Take 1 capsule (50 mg total) by mouth daily. Duanne Limerick, MD  Active   Dulaglutide (TRULICITY) 0.75 MG/0.5ML Namon Cirri [id number] Yes Inject 0.75 mg into the skin once a week. Takes on Saturdays [provider]  Active Self  gabapentin (NEURONTIN) 800 MG tablet [id number]  Take 800 mg by mouth 2 (two) times daily as needed (pain). blackwood [provider]  Active Self  INVOKAMET XR 150-500 MG TB24 [id number]  Take 1 tablet by mouth in the morning and at bedtime. [provider]  Active Self  lansoprazole (PREVACID) 30 MG capsule [id number] Yes TAKE 1 CAPSULE DAILY AT 12 NOON. Duanne Limerick, MD  Active   levothyroxine (SYNTHROID) 75 MCG tablet [id number] Yes Take 75 mcg by mouth daily before breakfast. Takes 75 mcg tues-Sunday and takes 112.5 mcf (1.5 tabs) on Monday [provider]  Active Self  losartan (COZAAR) 25 MG tablet [id number] Yes Take 1 tablet (25 mg total) by mouth daily. Duanne Limerick, MD  Active   Multiple Vitamins-Minerals (ONE-A-DAY VITACRAVES ADULT) CHEW [id number]  Chew 1 tablet by mouth daily. Patient take gummy [provider]  Active Self  oxybutynin (DITROPAN) 5 MG/5ML syrup [id number]  Take 5 mLs (5 mg total) by mouth 2 (two) times daily. Duanne Limerick, MD  Active   sertraline (ZOLOFT) 50 MG tablet [id number] Yes TAKE 1 TABLET EVERY DAY Duanne Limerick, MD  Active   triamcinolone cream (KENALOG) 0.1 % [id number]  Apply 1 application topically 2 (two) times daily. Duanne Limerick, MD  Active             Patient Active Problem List   Diagnosis Date Noted   Lumbar radiculopathy 01/22/2020   Immunization History  Administered Date(s) Administered   Fluad Quad(high Dose 65+) 06/27/2019, 05/30/2020  Influenza-Unspecified 10/14/2006, 07/13/2016   PFIZER Comirnaty(Gray Top)Covid-19 Tri-Sucrose Vaccine 03/24/2021   PFIZER(Purple Top)SARS-COV-2 Vaccination 08/29/2019, 09/16/2019, 06/10/2020   Pneumococcal Conjugate-13 06/27/2019   Pneumococcal Polysaccharide-23 07/13/2016   Tdap 11/16/2015   Zoster, Live 02/27/2011    Conditions to be addressed/monitored: HLD HTN T2DM  Care Plan : CCM Pharmacy  Care Plan  Updates made by Dahlia Byes, [facility] since 04/22/2021 12:00 AM     Problem: DM, HTN, HLD, Hypothyroidism, osteopenia   Priority: High     Long-Range Goal: Disease Management   Start Date: 10/31/2020  Recent Progress: Not on track  Priority: High  Note:   Current Barriers:  Unable to achieve control of T2DM   Pharmacist Clinical Goal(s):  Patient will verbalize ability to afford treatment regimen achieve adherence to monitoring guidelines and medication adherence to achieve therapeutic efficacy through collaboration with PharmD and provider.   Interventions: 1:1 collaboration with Duanne Limerick, MD regarding development and update of comprehensive plan of care as evidenced by provider attestation and co-signature Inter-disciplinary care team collaboration (see longitudinal plan of care) Comprehensive medication review performed; medication list updated in electronic medical  record  Hyperlipidemia: (LDL goal < 100) -Controlled -Current treatment: Atorvastatin 40 mg once daily -Current exercise habits: recently moved, continuing to move things in - considers this as primary source of exercise for now -FLD 02/2021, no dietary changes made due to move -Educated on Cholesterol goals;  Benefits of statin for ASCVD risk reduction; -Recommended to continue current medication  Diabetes (A1c goal <7%) -Not ideally controlled -Current medications: Trulicity 0.75 mg once weekly Invokanna (listed as current med - pt unable to confirm 2nd DM med) -Medications previously tried: ozempic (cost), Xigduo XR -Current home glucose readings fasting glucose: 120s-140s -Denies hypoglycemic/hyperglycemic symptoms -Educated on A1c and blood sugar goals; Exercise goal of 150 minutes per week; Financial review performed - does not qualify for PAP  -Recommended to continue current medication  Patient Goals/Self-Care Activities Patient will:  - take medications as prescribed focus on medication adherence by practicing consistent use of medications   Medication Assistance: Does not qualify for patient assistance  Patient's preferred pharmacy is:  [facility] [address] [place], [place] - [address] [place] [address] Phone: [phone] Fax: [phone]  [facility] (Now [facility]) - [place], [place] - [address] [place] [address] Phone: [phone] Fax: [phone]  Follow Up:  Patient agrees to Care Plan and Follow-up. Plan: HC 1 month DM/adherence review. CPP 6 months telephone       Future Appointments  Date Time Provider Department Center  05/05/2021 10:00 AM Duanne Limerick, MD [facility]  08/13/2021  2:00 PM [facility] NURSE HEALTH ADVISOR [facility]  10/21/2021  9:00 AM [facility]   Dahlia Byes, PharmD, CPP Clinical Pharmacist Practitioner  [facility]  [phone]

## 2021-04-22 NOTE — Patient Instructions (Signed)
Ms. Demby,  Thank you for talking with me today. I have included our care plan/goals in the following pages.   Please review and call me at [phone] with any questions.  Thanks! Johnell Comings, PharmD, CPP Clinical Pharmacist Practitioner  [phone]  Care Plan : CCM Pharmacy  Care Plan  Updates made by Dahlia Byes, [facility] since 04/22/2021 12:00 AM     Problem: DM, HTN, HLD, Hypothyroidism, osteopenia   Priority: High     Long-Range Goal: Disease Management   Start Date: 10/31/2020  Recent Progress: Not on track  Priority: High  Note:   Current Barriers:  Unable to achieve control of T2DM   Pharmacist Clinical Goal(s):  Patient will verbalize ability to afford treatment regimen achieve adherence to monitoring guidelines and medication adherence to achieve therapeutic efficacy through collaboration with PharmD and provider.   Interventions: 1:1 collaboration with Duanne Limerick, MD regarding development and update of comprehensive plan of care as evidenced by provider attestation and co-signature Inter-disciplinary care team collaboration (see longitudinal plan of care) Comprehensive medication review performed; medication list updated in electronic medical record  Hyperlipidemia: (LDL goal < 100) -Controlled -Current treatment: Atorvastatin 40 mg once daily -Current exercise habits: recently moved, continuing to move things in - considers this as primary source of exercise for now -FLD 02/2021, no dietary changes made due to move -Educated on Cholesterol goals;  Benefits of statin for ASCVD risk reduction; -Recommended to continue current medication  Diabetes (A1c goal <7%) -Not ideally controlled -Current medications: Trulicity 0.75 mg once weekly Invokanna (listed as current med - pt unable to confirm 2nd DM med) -Medications previously tried: ozempic (cost), Xigduo XR -Current home glucose readings fasting glucose: 120s-140s -Denies  hypoglycemic/hyperglycemic symptoms -Educated on A1c and blood sugar goals; Exercise goal of 150 minutes per week; Financial review performed - does not qualify for PAP  -Recommended to continue current medication  Patient Goals/Self-Care Activities Patient will:  - take medications as prescribed focus on medication adherence by practicing consistent use of medications   Medication Assistance: Does not qualify for patient assistance  Patient's preferred pharmacy is:  [facility] [address] [place], [place] - [address] [place] [address] Phone: [phone] Fax: [phone]  [facility] (Now [facility]) - [place], [place] - [address] [place] [address] Phone: [phone] Fax: [phone]  Follow Up:  Patient agrees to Care Plan and Follow-up. Plan: HC 1 month DM/adherence review. CPP 6 months telephone   The patient verbalized understanding of instructions provided today and agreed to receive a MyChart copy of patient instruction and/or educational materials. Telephone follow up appointment with pharmacy team member scheduled for: See next appointment with "Care Management Staff" under "What's Next" below.

## 2021-04-24 DIAGNOSIS — E1165 Type 2 diabetes mellitus with hyperglycemia: Secondary | ICD-10-CM | POA: Diagnosis not present

## 2021-05-05 ENCOUNTER — Other Ambulatory Visit: Payer: Self-pay

## 2021-05-05 ENCOUNTER — Encounter: Payer: Self-pay | Admitting: Family Medicine

## 2021-05-05 ENCOUNTER — Ambulatory Visit: Payer: Medicare PPO | Admitting: Family Medicine

## 2021-05-05 VITALS — BP 112/62 | HR 64 | Ht 60.0 in | Wt 188.0 lb

## 2021-05-05 DIAGNOSIS — N3941 Urge incontinence: Secondary | ICD-10-CM | POA: Diagnosis not present

## 2021-05-05 DIAGNOSIS — K219 Gastro-esophageal reflux disease without esophagitis: Secondary | ICD-10-CM | POA: Diagnosis not present

## 2021-05-05 DIAGNOSIS — Z23 Encounter for immunization: Secondary | ICD-10-CM | POA: Diagnosis not present

## 2021-05-05 DIAGNOSIS — F324 Major depressive disorder, single episode, in partial remission: Secondary | ICD-10-CM | POA: Diagnosis not present

## 2021-05-05 DIAGNOSIS — I10 Essential (primary) hypertension: Secondary | ICD-10-CM

## 2021-05-05 DIAGNOSIS — E7801 Familial hypercholesterolemia: Secondary | ICD-10-CM | POA: Diagnosis not present

## 2021-05-05 DIAGNOSIS — R5383 Other fatigue: Secondary | ICD-10-CM | POA: Diagnosis not present

## 2021-05-05 MED ORDER — ATORVASTATIN CALCIUM 40 MG PO TABS: 40.0000 mg | ORAL_TABLET | Freq: Every day | ORAL | 1 refills | Status: DC

## 2021-05-05 MED ORDER — LANSOPRAZOLE 30 MG PO CPDR: DELAYED_RELEASE_CAPSULE | ORAL | 1 refills | Status: DC

## 2021-05-05 MED ORDER — SERTRALINE HCL 50 MG PO TABS: 50.0000 mg | ORAL_TABLET | Freq: Every day | ORAL | 1 refills | Status: DC

## 2021-05-05 MED ORDER — LOSARTAN POTASSIUM 25 MG PO TABS: 25.0000 mg | ORAL_TABLET | Freq: Every day | ORAL | 1 refills | Status: DC

## 2021-05-05 MED ORDER — CARVEDILOL 6.25 MG PO TABS: 6.2500 mg | ORAL_TABLET | Freq: Every day | ORAL | 1 refills | Status: DC

## 2021-05-05 MED ORDER — OXYBUTYNIN CHLORIDE 5 MG/5ML PO SYRP: 5.0000 mg | ORAL_SOLUTION | Freq: Two times a day (BID) | ORAL | 1 refills | Status: DC

## 2021-05-05 NOTE — Progress Notes (Signed)
Date:  05/05/2021   Name:  Sara Carr   DOB:  06-Oct-1945   MRN:  [id number]   Chief Complaint: overactive bladder, Depression, Hypertension, Gastroesophageal Reflux, Hyperlipidemia, and Flu Vaccine  Depression        This is a chronic problem.  The current episode started more than 1 year ago.   The onset quality is undetermined.   The problem occurs rarely.  The problem has been resolved since onset.  Associated symptoms include no decreased concentration, no fatigue, no helplessness, no hopelessness, does not have insomnia, not irritable, no restlessness, no decreased interest, no appetite change, no body aches, no myalgias, no headaches, no indigestion, not sad and no suicidal ideas.     The symptoms are aggravated by family issues.  Past treatments include SSRIs - Selective serotonin reuptake inhibitors.  Compliance with treatment is good.  Previous treatment provided significant relief.  Past medical history includes hypothyroidism.     Pertinent negatives include no anxiety. Hypertension This is a chronic problem. The current episode started more than 1 year ago. The problem is unchanged. The problem is controlled. Pertinent negatives include no anxiety, blurred vision, chest pain, headaches, malaise/fatigue, neck pain, palpitations, peripheral edema or shortness of breath. Risk factors for coronary artery disease include diabetes mellitus, dyslipidemia, obesity and sedentary lifestyle. Past treatments include beta blockers, alpha 1 blockers and angiotensin blockers. The current treatment provides significant improvement. Compliance problems include exercise and diet.   Gastroesophageal Reflux She complains of heartburn. She reports no abdominal pain, no belching, no chest pain, no coughing, no dysphagia, no nausea, no sore throat or no wheezing. This is a recurrent problem. The current episode started in the past 7 days. The problem occurs rarely. The problem has been waxing and waning. The  heartburn duration is more than one hour. The heartburn is of mild intensity. The heartburn does not wake her from sleep. The heartburn does not limit her activity. The heartburn doesn't change with position. The symptoms are aggravated by certain foods. Pertinent negatives include no fatigue or weight loss. Risk factors include obesity and lack of exercise. She has tried head elevation, an antacid and a histamine-2 antagonist for the symptoms. The treatment provided moderate relief.  Hyperlipidemia This is a chronic problem. The current episode started more than 1 year ago. The problem is controlled. Exacerbating diseases include diabetes, hypothyroidism and obesity. Pertinent negatives include no chest pain, leg pain, myalgias or shortness of breath. Current antihyperlipidemic treatment includes statins. The current treatment provides mild improvement of lipids. Compliance problems include adherence to exercise and adherence to diet.    Lab Results  Component Value Date   CREATININE 1.14 (H) 11/15/2020   BUN 24 11/15/2020   NA 143 11/15/2020   K 5.4 (H) 11/15/2020   CL 105 11/15/2020   CO2 21 11/15/2020   Lab Results  Component Value Date   CHOL 155 11/15/2020   HDL 36 (L) 11/15/2020   LDLCALC 79 11/15/2020   TRIG 238 (H) 11/15/2020   No results found for: TSH Lab Results  Component Value Date   HGBA1C 8.5 (H) 01/22/2020   Lab Results  Component Value Date   WBC 9.2 11/15/2020   HGB 14.1 11/15/2020   HCT 45.4 11/15/2020   MCV 88 11/15/2020   PLT 288 11/15/2020   Lab Results  Component Value Date   ALT 16 05/20/2018   AST 16 05/20/2018     Review of Systems  Constitutional:  Negative for appetite  change, chills, fatigue, fever, malaise/fatigue and weight loss.  HENT:  Negative for drooling, ear discharge, ear pain and sore throat.   Eyes:  Negative for blurred vision.  Respiratory:  Negative for cough, shortness of breath and wheezing.   Cardiovascular:  Negative for  chest pain, palpitations and leg swelling.  Gastrointestinal:  Positive for heartburn. Negative for abdominal pain, blood in stool, constipation, diarrhea, dysphagia and nausea.  Endocrine: Negative for polydipsia.  Genitourinary:  Negative for dysuria, frequency, hematuria and urgency.  Musculoskeletal:  Negative for back pain, myalgias and neck pain.  Skin:  Negative for rash.  Allergic/Immunologic: Negative for environmental allergies.  Neurological:  Negative for dizziness and headaches.  Hematological:  Does not bruise/bleed easily.  Psychiatric/Behavioral:  Positive for depression. Negative for decreased concentration and suicidal ideas. The patient is not nervous/anxious and does not have insomnia.    Patient Active Problem List   Diagnosis Date Noted   Lumbar radiculopathy 01/22/2020    Allergies  Allergen Reactions   Penicillins Hives and Swelling    Past Surgical History:  Procedure Laterality Date   BACK SURGERY     CHOLECYSTECTOMY  1979   JOINT REPLACEMENT Bilateral 2017   LUMBAR LAMINECTOMY/ DECOMPRESSION WITH MET-RX N/A 01/22/2020   Procedure: L3-4 laminectomy;  Surgeon: [name], MD;  Location: [facility] ORS;  Service: Neurosurgery;  Laterality: N/A;   REPLACEMENT TOTAL KNEE Bilateral    SPINE SURGERY  2004    Social History   Tobacco Use   Smoking status: Never   Smokeless tobacco: Never   Tobacco comments:    none  Vaping Use   Vaping Use: Never used  Substance Use Topics   Alcohol use: Never   Drug use: Never     Medication list has been reviewed and updated.  Current Meds  Medication Sig   acetaminophen (TYLENOL) 325 MG tablet Take 2 tablets (650 mg total) by mouth every 4 (four) hours as needed for mild pain ((score 1 to 3) or temp > 100.5).   atorvastatin (LIPITOR) 40 MG tablet Take 1 tablet (40 mg total) by mouth daily.   calcium carbonate (TUMS - DOSED IN MG ELEMENTAL CALCIUM) 500 MG chewable tablet Chew 1 tablet by mouth daily as needed for  indigestion or heartburn.   carvedilol (COREG) 6.25 MG tablet Take 1 tablet (6.25 mg total) by mouth daily.   Continuous Blood Gluc Sensor (FREESTYLE LIBRE 14 DAY SENSOR) MISC    COVID-19 mRNA Vac-TriS, Pfizer, SUSP injection Inject into the muscle.   COVID-19 mRNA vaccine, Pfizer, 30 MCG/0.3ML injection INJECT AS DIRECTED   docusate sodium (COLACE) 50 MG capsule Take 1 capsule (50 mg total) by mouth daily.   Dulaglutide (TRULICITY) 1.82 XH/3.7JI SOPN Inject 0.75 mg into the skin once a week. Takes on Saturdays   gabapentin (NEURONTIN) 800 MG tablet Take 800 mg by mouth 2 (two) times daily as needed (pain). [place]   INVOKAMET XR 150-500 MG TB24 Take 1 tablet by mouth in the morning and at bedtime.   lansoprazole (PREVACID) 30 MG capsule TAKE 1 CAPSULE DAILY AT 12 NOON.   levothyroxine (SYNTHROID) 75 MCG tablet Take 75 mcg by mouth daily before breakfast. Takes 75 mcg tues-Sunday and takes 112.5 mcf (1.5 tabs) on Monday   losartan (COZAAR) 25 MG tablet Take 1 tablet (25 mg total) by mouth daily.   Multiple Vitamins-Minerals (ONE-A-DAY VITACRAVES ADULT) CHEW Chew 1 tablet by mouth daily. Patient take gummy   oxybutynin (DITROPAN) 5 MG/5ML syrup Take 5 mLs (  5 mg total) by mouth 2 (two) times daily.   sertraline (ZOLOFT) 50 MG tablet TAKE 1 TABLET EVERY DAY   triamcinolone cream (KENALOG) 0.1 % Apply 1 application topically 2 (two) times daily.    PHQ 2/9 Scores 05/05/2021 11/15/2020 08/07/2020 05/30/2020  PHQ - 2 Score 0 0 0 0  PHQ- 9 Score 0 4 - 0    GAD 7 : Generalized Anxiety Score 05/05/2021 11/15/2020 05/30/2020 11/28/2019  Nervous, Anxious, on Edge 0 1 0 0  Control/stop worrying 0 0 0 0  Worry too much - different things 0 0 0 0  Trouble relaxing 0 0 0 0  Restless 0 0 0 0  Easily annoyed or irritable 0 2 0 0  Afraid - awful might happen 0 2 0 0  Total GAD 7 Score 0 5 0 0  Anxiety Difficulty - Not difficult at all - -    BP Readings from Last 3 Encounters:  11/26/20 120/68   11/15/20 124/78  08/07/20 122/80    Physical Exam Vitals and nursing note reviewed.  Constitutional:      General: She is not irritable.    Appearance: She is well-developed.  HENT:     Head: Normocephalic.     Right Ear: External ear normal.     Left Ear: External ear normal.  Eyes:     General: Lids are everted, no foreign bodies appreciated. No scleral icterus.       Left eye: No foreign body or hordeolum.     Conjunctiva/sclera: Conjunctivae normal.     Right eye: Right conjunctiva is not injected.     Left eye: Left conjunctiva is not injected.     Pupils: Pupils are equal, round, and reactive to light.  Neck:     Thyroid: No thyromegaly.     Vascular: No JVD.     Trachea: No tracheal deviation.  Cardiovascular:     Rate and Rhythm: Normal rate and regular rhythm.     Heart sounds: Normal heart sounds. No murmur heard.   No friction rub. No gallop.  Pulmonary:     Effort: Pulmonary effort is normal. No respiratory distress.     Breath sounds: Normal breath sounds. No wheezing or rales.  Abdominal:     General: Bowel sounds are normal.     Palpations: Abdomen is soft. There is no mass.     Tenderness: There is no abdominal tenderness. There is no guarding or rebound.  Musculoskeletal:        General: No tenderness. Normal range of motion.     Cervical back: Normal range of motion and neck supple.  Lymphadenopathy:     Cervical: No cervical adenopathy.  Skin:    General: Skin is warm.     Findings: No rash.  Neurological:     Mental Status: She is alert and oriented to person, place, and time.     Cranial Nerves: Cranial nerves are intact. No cranial nerve deficit.     Deep Tendon Reflexes: Reflexes normal.  Psychiatric:        Mood and Affect: Mood is not anxious or depressed.    Wt Readings from Last 3 Encounters:  05/05/21 188 lb (85.3 kg)  11/26/20 187 lb (84.8 kg)  11/15/20 187 lb (84.8 kg)    Ht 5' (1.524 m)   Wt 188 lb (85.3 kg)   BMI 36.72 kg/m    Assessment and Plan:  1. Essential hypertension Chronic controlled. Stable. Cont carvedilol 6.25 q  day andlosrtan 25 mg daily. - carvedilol (COREG) 6.25 MG tablet; Take 1 tablet (6.25 mg total) by mouth daily.  Dispense: 90 tablet; Refill: 1 - losartan (COZAAR) 25 MG tablet; Take 1 tablet (25 mg total) by mouth daily.  Dispense: 90 tablet; Refill: 1  2. Familial hypercholesterolemia Chronic controlled uncomplicated cont. Atorvastatin 40 mg daily - atorvastatin (LIPITOR) 40 MG tablet; Take 1 tablet (40 mg total) by mouth daily.  Dispense: 90 tablet; Refill: 1 - Lipid Panel With LDL/HDL Ratio  3. Gastroesophageal reflux disease without esophagitis Chronic stable cont prevaced daily - lansoprazole (PREVACID) 30 MG capsule; Take 1 capsule daily  Dispense: 90 capsule; Refill: 1  4. Fatigue, unspecified type noted - losartan (COZAAR) 25 MG tablet; Take 1 tablet (25 mg total) by mouth daily.  Dispense: 90 tablet; Refill: 1  5. Urge incontinence Chronic controlled cont ditropan daily - oxybutynin (DITROPAN) 5 MG/5ML syrup; Take 5 mLs (5 mg total) by mouth 2 (two) times daily.  Dispense: 180 mL; Refill: 1  6. Major depressive disorder in partial remission, unspecified whether recurrent (HCC)  Chronic controolr cont sertraline 50 mg daily discussed and administered. - sertraline (ZOLOFT) 50 MG tablet; Take 1 tablet (50 mg total) by mouth daily.  Dispense: 90 tablet; Refill: 1  7. Need for immunization against influenza See above discussed and administered - Flu Vaccine QUAD High Dose(Fluad)

## 2021-05-06 LAB — LIPID PANEL WITH LDL/HDL RATIO
Cholesterol, Total: 147 mg/dL (ref 100–199)
HDL: 39 mg/dL — ABNORMAL LOW (ref >39)
LDL Chol Calc (NIH): 83 mg/dL (ref 0–99)
LDL/HDL Ratio: 2.1 ratio (ref 0.0–3.2)
Triglycerides: 140 mg/dL (ref 0–149)
VLDL Cholesterol Cal: 25 mg/dL (ref 5–40)

## 2021-05-16 DIAGNOSIS — E1140 Type 2 diabetes mellitus with diabetic neuropathy, unspecified: Secondary | ICD-10-CM

## 2021-05-16 DIAGNOSIS — E7801 Familial hypercholesterolemia: Secondary | ICD-10-CM | POA: Diagnosis not present

## 2021-05-28 DIAGNOSIS — E1142 Type 2 diabetes mellitus with diabetic polyneuropathy: Secondary | ICD-10-CM | POA: Diagnosis not present

## 2021-05-28 DIAGNOSIS — B351 Tinea unguium: Secondary | ICD-10-CM | POA: Diagnosis not present

## 2021-07-22 IMAGING — CT CT T SPINE W/O CM
3 series · 10 of 33 positions shown, 11 images · non-contrast
Comparison: None.

CLINICAL DATA: Mid back pain.

EXAM:
CT THORACIC AND LUMBAR SPINE WITHOUT CONTRAST
TECHNIQUE: Multidetector CT imaging of the thoracic and lumbar spine was
performed without contrast. Multiplanar CT image reconstructions
were also generated.

[Series 4: t spine soft · axial · 0.28mm/px · z∈[-240,-100]mm · 2 of 154 slices shown, 3 images]
[im 48/154  soft-tissue]
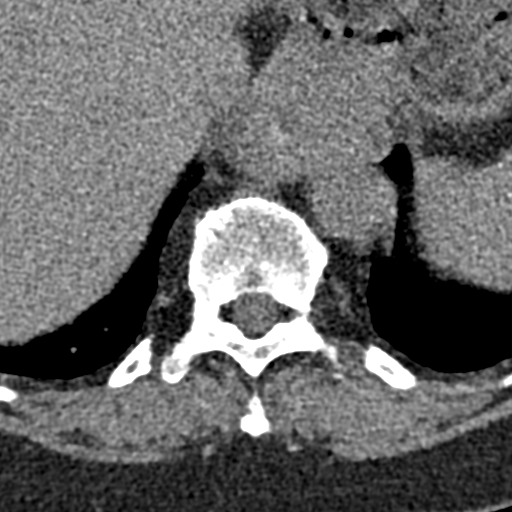
[im 48/154  bone]
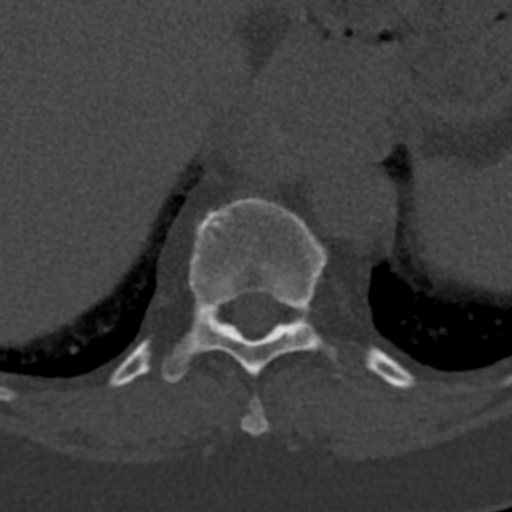
[im 118/154  bone]
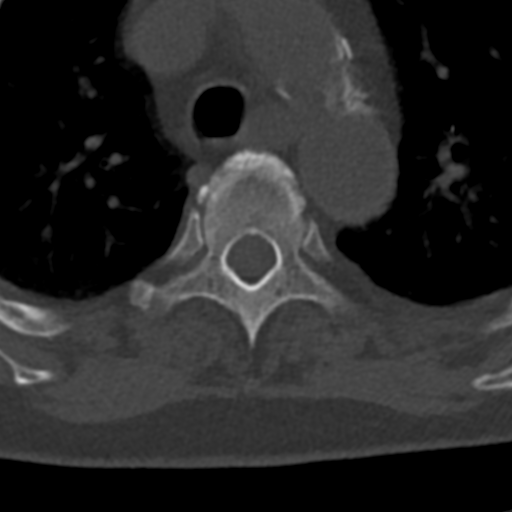

[Series 6: sagittal bone · sagittal · 0.31mm/px · 5 of 73 slices shown]
[im 25/73  bone]
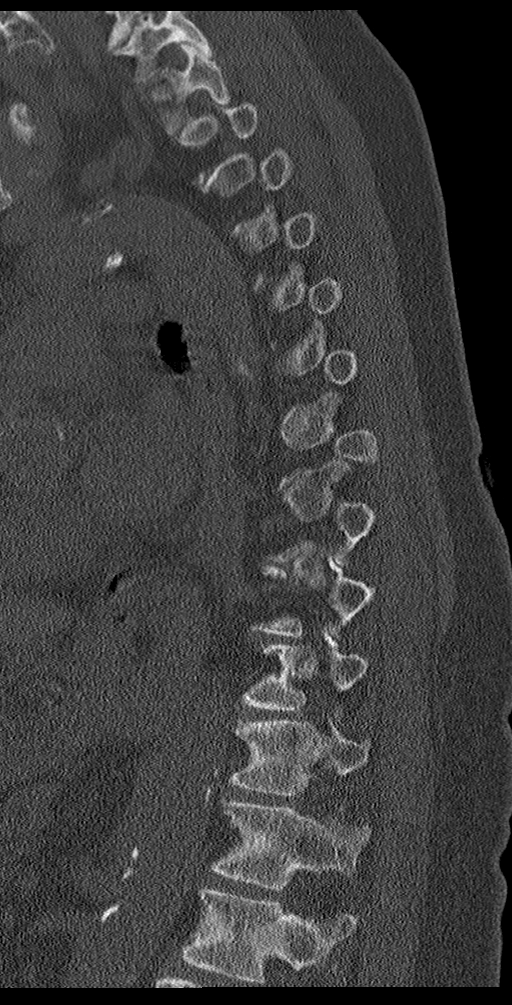
[im 31/73  bone]
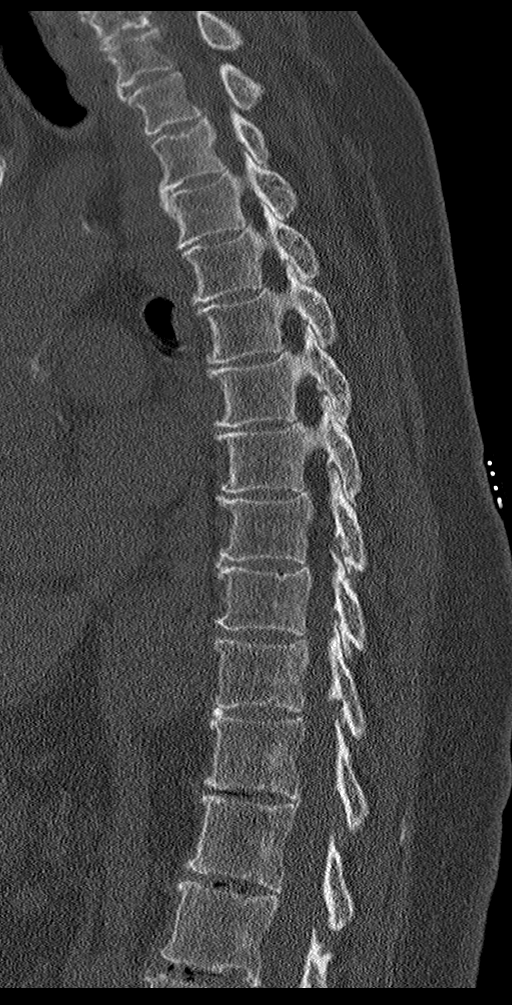
[im 37/73  bone]
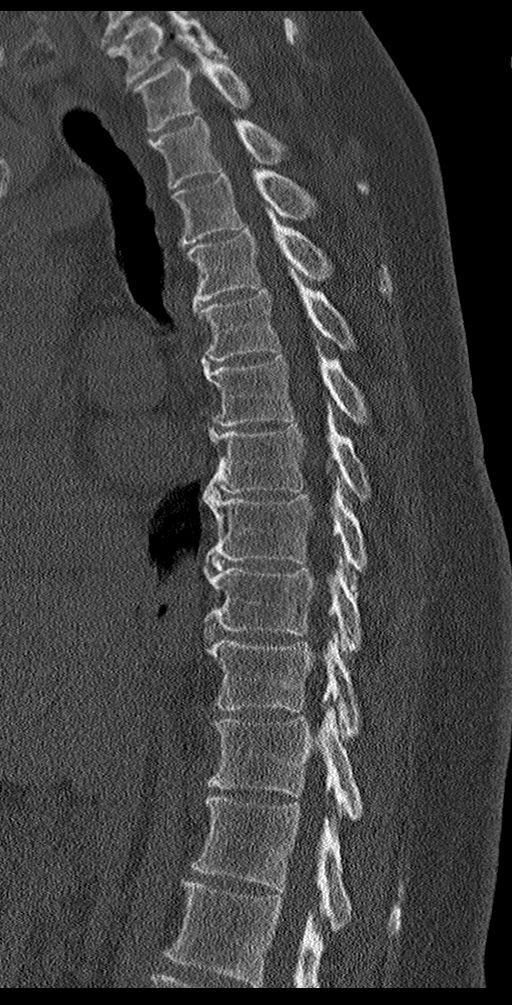
[im 43/73  bone]
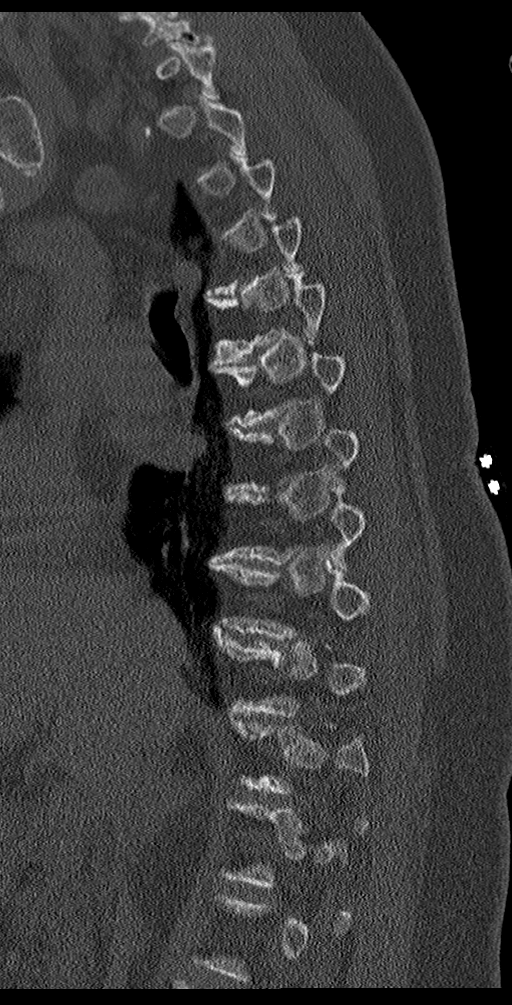
[im 49/73  bone]
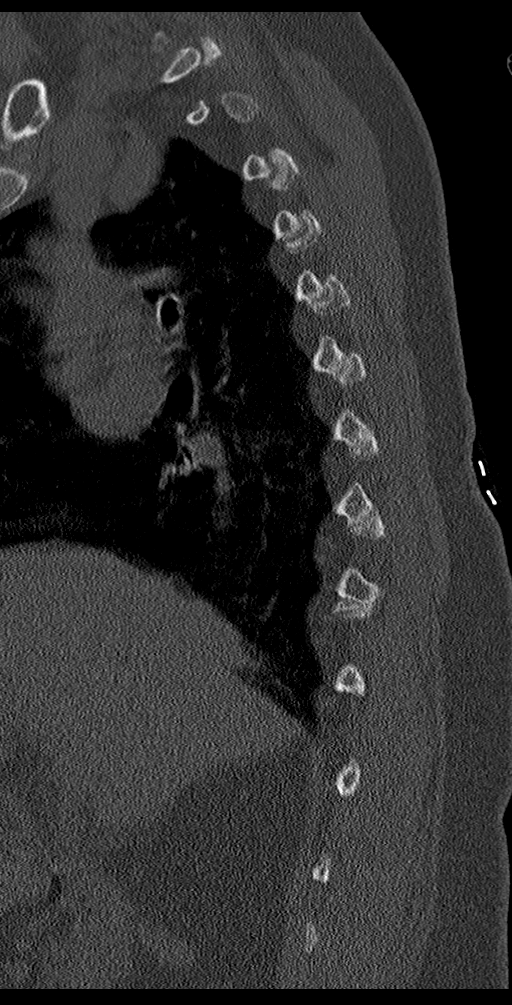

[Series 7: coronal bone · coronal · 0.30mm/px · 3 of 75 slices shown]
[im 15/75  bone]
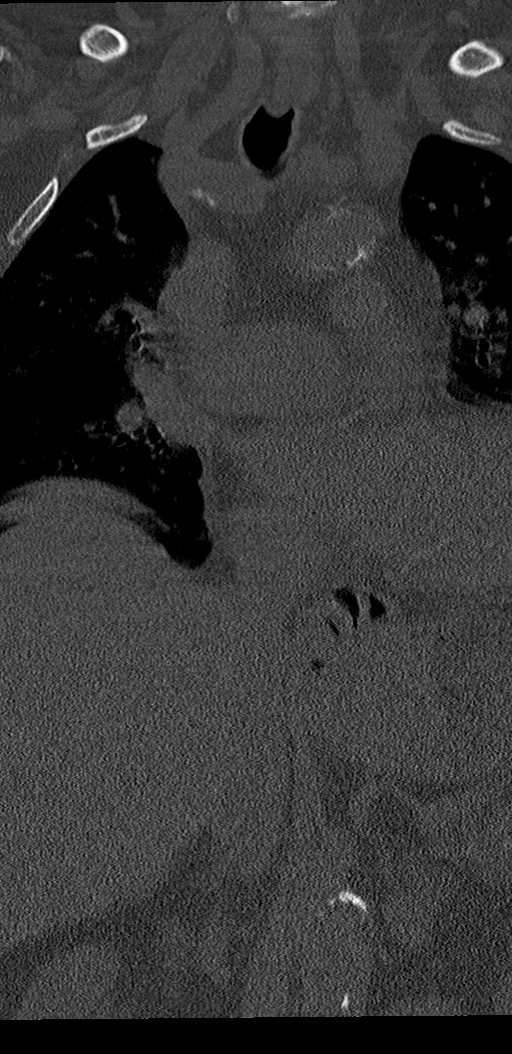
[im 30/75  bone]
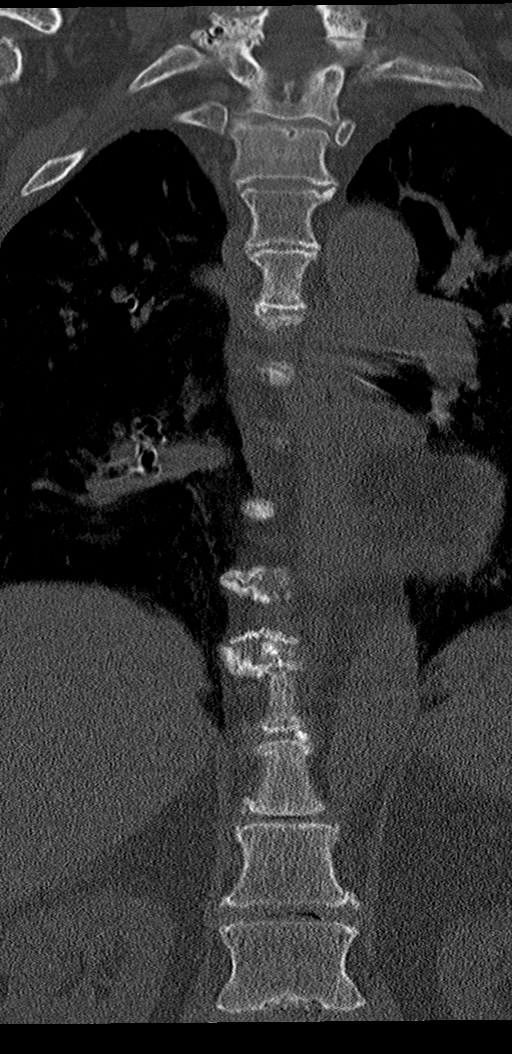
[im 45/75  bone]
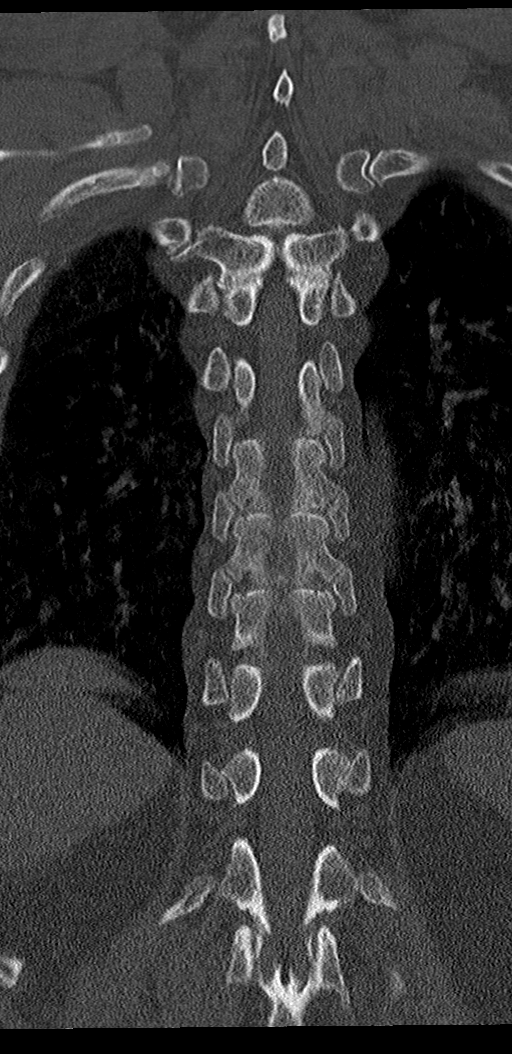

[10 of 33 positions shown; findings below may reference images not displayed]

FINDINGS: CT THORACIC SPINE FINDINGS

Alignment: Normal.

Vertebrae: No acute fracture or focal pathologic process. Multilevel
endplate sclerosis with bridging osteophytosis.

Paraspinal and other soft tissues: Dependent subsegmental
atelectasis. Calcified atheromatous plaque involving the thoracic
aorta and its branch vessels.

Disc levels: No significant narrowing of the bony spinal canal or
neural foramina.

CT LUMBAR SPINE FINDINGS

Segmentation: 5 lumbar type vertebrae.

Alignment: Grade 1 L2-3 retrolisthesis. Sequela of L4-5
laminectomies and posterior spinal fusion with transpedicular screws
and interlocking rods. Associated beam hardening artifact limits
evaluation. No peri-hardware lucency. Hardware is intact and appears
well seated. Heterotopic ossification and partial fusion of the
posterior elements at the L4-5 level.

Vertebrae: Vertebral body heights are preserved. Multilevel
degenerative changes including endplate sclerosis, osteophytosis and
Schmorl's node formation.

Paraspinal and other soft tissues: Atherosclerotic calcifications
involving the abdominal aorta and its branch vessels. Paraspinal
soft tissues are unremarkable. Partially imaged right
nephrolithiasis. No hydronephrosis.

Disc levels: L4-5 interbody spacer. Multilevel mild-to-moderate disc
space loss with vacuum disc phenomena at the L1-L4 levels.

Mild bony spinal canal narrowing at the L2-3 level secondary to
retrolisthesis.

Mild L3-4 spinal canal and bilateral neural foraminal narrowing
secondary to disc bulge, prominent osteophytosis and bilateral facet
hypertrophy.

Moderate right L4-5 neural foraminal narrowing. No significant
spinal canal narrowing at the remaining lumbar levels.
IMPRESSION: CT THORACIC SPINE IMPRESSION

No acute osseous abnormality.

Multilevel thoracic spondylosis. No significant bony spinal canal or
neural foraminal narrowing.

CT LUMBAR SPINE IMPRESSION

No acute osseous abnormality. Sequela of L4-5 posterior spinal
fusion without adverse features.

Multilevel lumbar spondylosis and grade 1 L2-3 retrolisthesis.

Mild L2-4 spinal canal and L3-4 neural foraminal narrowing.

Moderate right L4-5 neural foraminal narrowing.

Nonobstructive right nephrolithiasis.

## 2021-08-13 ENCOUNTER — Ambulatory Visit (INDEPENDENT_AMBULATORY_CARE_PROVIDER_SITE_OTHER): Payer: Medicare PPO

## 2021-08-13 DIAGNOSIS — Z0000 Encounter for general adult medical examination without abnormal findings: Secondary | ICD-10-CM

## 2021-08-13 NOTE — Patient Instructions (Signed)
Sara Carr , Thank you for taking time to come for your Medicare Wellness Visit. I appreciate your ongoing commitment to your health goals. Please review the following plan we discussed and let me know if I can assist you in the future.   Screening recommendations/referrals: Colonoscopy: no longer required Mammogram: done 09/30/20 Bone Density: done 09/30/20 Recommended yearly ophthalmology/optometry visit for glaucoma screening and checkup Recommended yearly dental visit for hygiene and checkup  Vaccinations: Influenza vaccine: done 05/05/21 Pneumococcal vaccine: done 06/27/19 Tdap vaccine: done 11/16/15 Shingles vaccine: need records   Covid-19:done 08/29/19, 09/16/19, 06/10/20 & 03/24/21  Advanced directives: Please bring a copy of your health care power of attorney and living will to the office at your convenience.   Conditions/risks identified: Recommend healthy eating and physical activity to lower A1c  Next appointment: Follow up in one year for your annual wellness visit    Preventive Care 65 Years and Older, Female Preventive care refers to lifestyle choices and visits with your health care provider that can promote health and wellness. What does preventive care include? A yearly physical exam. This is also called an annual well check. Dental exams once or twice a year. Routine eye exams. Ask your health care provider how often you should have your eyes checked. Personal lifestyle choices, including: Daily care of your teeth and gums. Regular physical activity. Eating a healthy diet. Avoiding tobacco and drug use. Limiting alcohol use. Practicing safe sex. Taking low-dose aspirin every day. Taking vitamin and mineral supplements as recommended by your health care provider. What happens during an annual well check? The services and screenings done by your health care provider during your annual well check will depend on your age, overall health, lifestyle risk factors, and  family history of disease. Counseling  Your health care provider may ask you questions about your: Alcohol use. Tobacco use. Drug use. Emotional well-being. Home and relationship well-being. Sexual activity. Eating habits. History of falls. Memory and ability to understand (cognition). Work and work Astronomer. Reproductive health. Screening  You may have the following tests or measurements: Height, weight, and BMI. Blood pressure. Lipid and cholesterol levels. These may be checked every 5 years, or more frequently if you are over 31 years old. Skin check. Lung cancer screening. You may have this screening every year starting at age 19 if you have a 30-pack-year history of smoking and currently smoke or have quit within the past 15 years. Fecal occult blood test (FOBT) of the stool. You may have this test every year starting at age 68. Flexible sigmoidoscopy or colonoscopy. You may have a sigmoidoscopy every 5 years or a colonoscopy every 10 years starting at age 59. Hepatitis C blood test. Hepatitis B blood test. Sexually transmitted disease (STD) testing. Diabetes screening. This is done by checking your blood sugar (glucose) after you have not eaten for a while (fasting). You may have this done every 1-3 years. Bone density scan. This is done to screen for osteoporosis. You may have this done starting at age 36. Mammogram. This may be done every 1-2 years. Talk to your health care provider about how often you should have regular mammograms. Talk with your health care provider about your test results, treatment options, and if necessary, the need for more tests. Vaccines  Your health care provider may recommend certain vaccines, such as: Influenza vaccine. This is recommended every year. Tetanus, diphtheria, and acellular pertussis (Tdap, Td) vaccine. You may need a Td booster every 10 years. Zoster vaccine. You  may need this after age 10. Pneumococcal 13-valent conjugate  (PCV13) vaccine. One dose is recommended after age 34. Pneumococcal polysaccharide (PPSV23) vaccine. One dose is recommended after age 45. Talk to your health care provider about which screenings and vaccines you need and how often you need them. This information is not intended to replace advice given to you by your health care provider. Make sure you discuss any questions you have with your health care provider. Document Released: 08/30/2015 Document Revised: 04/22/2016 Document Reviewed: 06/04/2015 Elsevier Interactive Patient Education  2017 [place] Prevention in the Home Falls can cause injuries. They can happen to people of all ages. There are many things you can do to make your home safe and to help prevent falls. What can I do on the outside of my home? Regularly fix the edges of walkways and driveways and fix any cracks. Remove anything that might make you trip as you walk through a door, such as a raised step or threshold. Trim any bushes or trees on the path to your home. Use bright outdoor lighting. Clear any walking paths of anything that might make someone trip, such as rocks or tools. Regularly check to see if handrails are loose or broken. Make sure that both sides of any steps have handrails. Any raised decks and porches should have guardrails on the edges. Have any leaves, snow, or ice cleared regularly. Use sand or salt on walking paths during winter. Clean up any spills in your garage right away. This includes oil or grease spills. What can I do in the bathroom? Use night lights. Install grab bars by the toilet and in the tub and shower. Do not use towel bars as grab bars. Use non-skid mats or decals in the tub or shower. If you need to sit down in the shower, use a plastic, non-slip stool. Keep the floor dry. Clean up any water that spills on the floor as soon as it happens. Remove soap buildup in the tub or shower regularly. Attach bath mats securely with  double-sided non-slip rug tape. Do not have throw rugs and other things on the floor that can make you trip. What can I do in the bedroom? Use night lights. Make sure that you have a light by your bed that is easy to reach. Do not use any sheets or blankets that are too big for your bed. They should not hang down onto the floor. Have a firm chair that has side arms. You can use this for support while you get dressed. Do not have throw rugs and other things on the floor that can make you trip. What can I do in the kitchen? Clean up any spills right away. Avoid walking on wet floors. Keep items that you use a lot in easy-to-reach places. If you need to reach something above you, use a strong step stool that has a grab bar. Keep electrical cords out of the way. Do not use floor polish or wax that makes floors slippery. If you must use wax, use non-skid floor wax. Do not have throw rugs and other things on the floor that can make you trip. What can I do with my stairs? Do not leave any items on the stairs. Make sure that there are handrails on both sides of the stairs and use them. Fix handrails that are broken or loose. Make sure that handrails are as long as the stairways. Check any carpeting to make sure that it is firmly  attached to the stairs. Fix any carpet that is loose or worn. Avoid having throw rugs at the top or bottom of the stairs. If you do have throw rugs, attach them to the floor with carpet tape. Make sure that you have a light switch at the top of the stairs and the bottom of the stairs. If you do not have them, ask someone to add them for you. What else can I do to help prevent falls? Wear shoes that: Do not have high heels. Have rubber bottoms. Are comfortable and fit you well. Are closed at the toe. Do not wear sandals. If you use a stepladder: Make sure that it is fully opened. Do not climb a closed stepladder. Make sure that both sides of the stepladder are locked  into place. Ask someone to hold it for you, if possible. Clearly mark and make sure that you can see: Any grab bars or handrails. First and last steps. Where the edge of each step is. Use tools that help you move around (mobility aids) if they are needed. These include: Canes. Walkers. Scooters. Crutches. Turn on the lights when you go into a dark area. Replace any light bulbs as soon as they burn out. Set up your furniture so you have a clear path. Avoid moving your furniture around. If any of your floors are uneven, fix them. If there are any pets around you, be aware of where they are. Review your medicines with your doctor. Some medicines can make you feel dizzy. This can increase your chance of falling. Ask your doctor what other things that you can do to help prevent falls. This information is not intended to replace advice given to you by your health care provider. Make sure you discuss any questions you have with your health care provider. Document Released: 05/30/2009 Document Revised: 01/09/2016 Document Reviewed: 09/07/2014 Elsevier Interactive Patient Education  2017 [employer].

## 2021-08-13 NOTE — Progress Notes (Signed)
Subjective:   Sara Carr is a 75 y.o. female who presents for Medicare Annual (Subsequent) preventive examination.  Virtual Visit via Telephone Note  I connected with  Koree Ground on 08/13/21 at  2:00 PM EST by telephone and verified that I am speaking with the correct person using two identifiers.  Location: Patient: home Provider: [facility] Persons participating in the virtual visit: [place]   I discussed the limitations, risks, security and privacy concerns of performing an evaluation and management service by telephone and the availability of in person appointments. The patient expressed understanding and agreed to proceed.  Interactive audio and video telecommunications were attempted between this nurse and patient, however failed, due to patient having technical difficulties OR patient did not have access to video capability.  We continued and completed visit with audio only.  Some vital signs may be absent or patient reported.   Clemetine Marker, LPN        Objective:    Today's Vitals   08/13/21 1413  PainSc: 6    There is no height or weight on file to calculate BMI.  Advanced Directives 08/13/2021 08/07/2020 01/22/2020 01/22/2020 01/17/2020 11/29/2019  Does Patient Have a Medical Advance Directive? Yes Yes Yes - Yes No  Type of Advance Directive [place];Living will [place];Living will [place];Living will [place];Living will Living will -  Does patient want to make changes to medical advance directive? - - No - Patient declined - - -  Copy of [place] in Chart? Yes - validated most recent copy scanned in chart (See row information) Yes - validated most recent copy scanned in chart (See row information) Yes - validated most recent copy scanned in chart (See row information) Yes - validated most recent copy scanned in chart (See row information) - -  Would  patient like information on creating a medical advance directive? - - - - - No - Patient declined    Current Medications (verified) Outpatient Encounter Medications as of 08/13/2021  Medication Sig   acetaminophen (TYLENOL) 325 MG tablet Take 2 tablets (650 mg total) by mouth every 4 (four) hours as needed for mild pain ((score 1 to 3) or temp > 100.5).   atorvastatin (LIPITOR) 40 MG tablet Take 1 tablet (40 mg total) by mouth daily.   calcium carbonate (TUMS - DOSED IN MG ELEMENTAL CALCIUM) 500 MG chewable tablet Chew 1 tablet by mouth daily as needed for indigestion or heartburn.   carvedilol (COREG) 6.25 MG tablet Take 1 tablet (6.25 mg total) by mouth daily.   Continuous Blood Gluc Sensor (FREESTYLE LIBRE 14 DAY SENSOR) MISC    docusate sodium (COLACE) 50 MG capsule Take 1 capsule (50 mg total) by mouth daily.   Dulaglutide 1.5 MG/0.5ML SOPN Inject into the skin. Inject 0.5 mLs (1.5 mg total) subcutaneously every 7 (seven) days   gabapentin (NEURONTIN) 800 MG tablet Take 800 mg by mouth 2 (two) times daily as needed (pain). [place]   INVOKAMET XR 150-500 MG TB24 Take 1 tablet by mouth in the morning and at bedtime.   lansoprazole (PREVACID) 30 MG capsule Take 1 capsule daily   levothyroxine (SYNTHROID) 75 MCG tablet Take 75 mcg by mouth daily before breakfast. Takes 75 mcg tues-Sunday and takes 112.5 mcf (1.5 tabs) on Monday   losartan (COZAAR) 25 MG tablet Take 1 tablet (25 mg total) by mouth daily.   Multiple Vitamins-Minerals (ONE-A-DAY VITACRAVES ADULT) CHEW  Chew 1 tablet by mouth daily. Patient take gummy   sertraline (ZOLOFT) 50 MG tablet Take 1 tablet (50 mg total) by mouth daily.   [DISCONTINUED] COVID-19 mRNA Vac-TriS, Pfizer, SUSP injection Inject into the muscle.   [DISCONTINUED] Dulaglutide (TRULICITY) 7.12 WP/8.0DX SOPN Inject 0.75 mg into the skin once a week. Takes on Saturdays   [DISCONTINUED] oxybutynin (DITROPAN) 5 MG/5ML syrup Take 5 mLs (5 mg total) by mouth 2 (two)  times daily.   [DISCONTINUED] triamcinolone cream (KENALOG) 0.1 % Apply 1 application topically 2 (two) times daily.   No facility-administered encounter medications on file as of 08/13/2021.    Allergies (verified) Penicillins   History: Past Medical History:  Diagnosis Date   Allergy    Anemia    as a little gitrl, but not anymore    Arthritis    Back pain    Depression    Diabetes mellitus without complication ([place])    GERD (gastroesophageal reflux disease)    Hyperlipidemia    Hypertension    Hypothyroidism    Thyroid disease    Past Surgical History:  Procedure Laterality Date   BACK SURGERY     CHOLECYSTECTOMY  1979   JOINT REPLACEMENT Bilateral 2017   LUMBAR LAMINECTOMY/ DECOMPRESSION WITH MET-RX N/A 01/22/2020   Procedure: L3-4 laminectomy;  Surgeon: [name], MD;  Location: [facility] ORS;  Service: Neurosurgery;  Laterality: N/A;   REPLACEMENT TOTAL KNEE Bilateral    SPINE SURGERY  2004   Family History  Problem Relation Age of Onset   Stroke Mother    Heart disease Father    Diabetes Father    Hypertension Father    Drug abuse Daughter    Early death Brother    Breast cancer Neg Hx    Social History   Socioeconomic History   Marital status: Married    Spouse name: Not on file   Number of children: 1   Years of education: Not on file   Highest education level: Not on file  Occupational History   Not on file  Tobacco Use   Smoking status: Never   Smokeless tobacco: Never   Tobacco comments:    none  Vaping Use   Vaping Use: Never used  Substance and Sexual Activity   Alcohol use: Never   Drug use: Never   Sexual activity: Not Currently    Birth control/protection: None  Other Topics Concern   Not on file  Social History Narrative   Not on file   Social Determinants of Health   Financial Resource Strain: Low Risk    Difficulty of Paying Living Expenses: Not hard at all  Food Insecurity: No Food Insecurity   Worried About Sales executive in the Last Year: Never true   Ran Out of Food in the Last Year: Never true  Transportation Needs: No Transportation Needs   Lack of Transportation (Medical): No   Lack of Transportation (Non-Medical): No  Physical Activity: Inactive   Days of Exercise per Week: 0 days   Minutes of Exercise per Session: 0 min  Stress: No Stress Concern Present   Feeling of Stress : Not at all  Social Connections: Moderately Isolated   Frequency of Communication with Friends and Family: More than three times a week   Frequency of Social Gatherings with Friends and Family: More than three times a week   Attends Religious Services: Never   Marine scientist or Organizations: No   Attends Archivist  Meetings: Never   Marital Status: Married    Tobacco Counseling Counseling given: Not Answered Tobacco comments: none   Clinical Intake:  Pre-visit preparation completed: Yes  Pain : 0-10 Pain Score: 6  Pain Type: Chronic pain Pain Location: Leg Pain Orientation: Right, Left Pain Descriptors / Indicators: Aching, Sore Pain Onset: More than a month ago Pain Frequency: Intermittent Effect of Pain on Daily Activities: pain while walking     Nutritional Risks: None Diabetes: Yes CBG done?: No Did pt. bring in CBG monitor from home?: No  How often do you need to have someone help you when you read instructions, pamphlets, or other written materials from your doctor or pharmacy?: 1 - Never  Nutrition Risk Assessment:  Has the patient had any N/V/D within the last 2 months?  No  Does the patient have any non-healing wounds?  No  Has the patient had any unintentional weight loss or weight gain?  No   Diabetes:  Is the patient diabetic?  Yes  If diabetic, was a CBG obtained today?  No  Did the patient bring in their glucometer from home?  No  How often do you monitor your CBG's? 3 times daily with sensor.   Financial Strains and Diabetes Management:  Are you having any  financial strains with the device, your supplies or your medication? No .  Does the patient want to be seen by Chronic Care Management for management of their diabetes?  No  Would the patient like to be referred to a Nutritionist or for Diabetic Management?  No   Diabetic Exams:  Diabetic Eye Exam: Completed per patient at [facility]; need records.  Diabetic Foot Exam:Pt has been advised about the importance in completing this exam. Pt is scheduled for diabetic foot exam in January with endocrinology; pt also sees podiatry for diabetic foot care.    Interpreter Needed?: No  Information entered by :: Clemetine Marker LPN   Activities of Daily Living In your present state of health, do you have any difficulty performing the following activities: 08/13/2021  Hearing? N  Vision? N  Difficulty concentrating or making decisions? N  Walking or climbing stairs? N  Dressing or bathing? N  Doing errands, shopping? N  Preparing Food and eating ? N  Using the Toilet? N  In the past six months, have you accidently leaked urine? Y  Comment wears pads for protection  Do you have problems with loss of bowel control? N  Managing your Medications? N  Managing your Finances? N  Housekeeping or managing your Housekeeping? N  Some recent data might be hidden    Patient Care Team: Juline Patch, MD as PCP - General (Family Medicine) Lonia Farber, MD as Consulting Physician (Internal Medicine)  Indicate any recent Medical Services you may have received from other than [facility] providers in the past year (date may be approximate).     Assessment:   This is a routine wellness examination for [place].  Hearing/Vision screen Hearing Screening - Comments:: Pt denies hearing difficulty Vision Screening - Comments:: Annual vision screenings done at [place] issues and exercise activities discussed: Current Exercise Habits: The patient does not participate in  regular exercise at present, Exercise limited by: orthopedic condition(s);neurologic condition(s)   Goals Addressed   None    Depression Screen PHQ 2/9 Scores 08/13/2021 05/05/2021 11/15/2020 08/07/2020 05/30/2020 11/28/2019 11/23/2019  PHQ - 2 Score 0 0 0 0 0 6 6  PHQ- 9  Score - 0 4 - 0 8 9    Fall Risk Fall Risk  08/13/2021 05/05/2021 08/07/2020 05/30/2020 11/28/2019  Falls in the past year? 0 0 0 0 0  Number falls in past yr: 0 0 0 - -  Injury with Fall? 0 0 0 - -  Risk for fall due to : Impaired balance/gait No Fall Risks No Fall Risks - -  Follow up Falls prevention discussed Falls evaluation completed Falls prevention discussed Falls evaluation completed Falls evaluation completed    [place]:  Any stairs in or around the home? Yes  If so, are there any without handrails? No  Home free of loose throw rugs in walkways, pet beds, electrical cords, etc? Yes  Adequate lighting in your home to reduce risk of falls? Yes   ASSISTIVE DEVICES UTILIZED TO PREVENT FALLS:  Life alert? No  Use of a cane, walker or w/c? Yes  Grab bars in the bathroom? No  Shower chair or bench in shower? Yes Elevated toilet seat or a handicapped toilet? No   TIMED UP AND GO:  Was the test performed? No . Telephonic visit  Cognitive Function:     6CIT Screen 08/13/2021 08/07/2020  What Year? 0 points 0 points  What month? 0 points 0 points  What time? 0 points 0 points  Count back from 20 0 points 0 points  Months in reverse 4 points 4 points  Repeat phrase 0 points 4 points  Total Score 4 8    Immunizations Immunization History  Administered Date(s) Administered   Fluad Quad(high Dose 65+) 06/27/2019, 05/30/2020, 05/05/2021   Influenza-Unspecified 10/14/2006, 07/13/2016   PFIZER Comirnaty(Gray Top)Covid-19 Tri-Sucrose Vaccine 03/24/2021   PFIZER(Purple Top)SARS-COV-2 Vaccination 08/29/2019, 09/16/2019, 06/10/2020   Pneumococcal Conjugate-13 06/27/2019    Pneumococcal Polysaccharide-23 07/13/2016   Tdap 11/16/2015   Zoster, Live 02/27/2011    TDAP status: Up to date  Flu Vaccine status: Up to date  Pneumococcal vaccine status: Up to date  Covid-19 vaccine status: Completed vaccines  Qualifies for Shingles Vaccine? Yes   Zostavax completed Yes   Shingrix Completed?: Yes - per patient; need records  Screening Tests Health Maintenance  Topic Date Due   FOOT EXAM  Never done   OPHTHALMOLOGY EXAM  Never done   Zoster Vaccines- Shingrix (1 of 2) Never done   COVID-19 Vaccine (5 - Booster for Pfizer series) 05/19/2021   Hepatitis C Screening  05/05/2022 (Originally 06/07/1964)   HEMOGLOBIN A1C  10/22/2021   TETANUS/TDAP  11/15/2025   Pneumonia Vaccine 58+ Years old  Completed   INFLUENZA VACCINE  Completed   DEXA SCAN  Completed   HPV VACCINES  Aged Out   COLONOSCOPY (Pts 45-36yr Insurance coverage will need to be confirmed)  Discontinued    Health Maintenance  Health Maintenance Due  Topic Date Due   FOOT EXAM  Never done   OPHTHALMOLOGY EXAM  Never done   Zoster Vaccines- Shingrix (1 of 2) Never done   COVID-19 Vaccine (5 - Booster for Pfizer series) 05/19/2021    Colorectal cancer screening: No longer required.   Mammogram status: Completed 09/30/20. Repeat every year  Bone Density status: Completed 09/30/20. Results reflect: Bone density results: OSTEOPENIA. Repeat every 2 years.  Lung Cancer Screening: (Low Dose CT Chest recommended if Age 227-80years, 30 pack-year currently smoking OR have quit w/in 15years.) does not qualify.   Additional Screening:  Hepatitis C Screening: does qualify; postponed  Vision Screening: Recommended annual  ophthalmology exams for early detection of glaucoma and other disorders of the eye. Is the patient up to date with their annual eye exam?  Yes  Who is the provider or what is the name of the office in which the patient attends annual eye exams? [facility].   Dental  Screening: Recommended annual dental exams for proper oral hygiene  Community Resource Referral / Chronic Care Management: CRR required this visit?  No   CCM required this visit?  No      Plan:     I have personally reviewed and noted the following in the patients chart:   Medical and social history Use of alcohol, tobacco or illicit drugs  Current medications and supplements including opioid prescriptions.  Functional ability and status Nutritional status Physical activity Advanced directives List of other physicians Hospitalizations, surgeries, and ER visits in previous 12 months Vitals Screenings to include cognitive, depression, and falls Referrals and appointments  In addition, I have reviewed and discussed with patient certain preventive protocols, quality metrics, and best practice recommendations. A written personalized care plan for preventive services as well as general preventive health recommendations were provided to patient.     Clemetine Marker, LPN   [id number]   Nurse Notes: pt c/o difficulty sleeping, has tried melatonin but it did not work; pt advised to discuss with PCP.   Pt also c/o leg pain; taking gabapentin PRN.

## 2021-08-22 DIAGNOSIS — R2689 Other abnormalities of gait and mobility: Secondary | ICD-10-CM | POA: Diagnosis not present

## 2021-08-28 ENCOUNTER — Other Ambulatory Visit: Payer: Self-pay | Admitting: Family Medicine

## 2021-08-28 DIAGNOSIS — Z1231 Encounter for screening mammogram for malignant neoplasm of breast: Secondary | ICD-10-CM

## 2021-08-29 DIAGNOSIS — E063 Autoimmune thyroiditis: Secondary | ICD-10-CM | POA: Diagnosis not present

## 2021-08-29 DIAGNOSIS — E1165 Type 2 diabetes mellitus with hyperglycemia: Secondary | ICD-10-CM | POA: Diagnosis not present

## 2021-08-29 DIAGNOSIS — I152 Hypertension secondary to endocrine disorders: Secondary | ICD-10-CM | POA: Diagnosis not present

## 2021-08-29 DIAGNOSIS — E1159 Type 2 diabetes mellitus with other circulatory complications: Secondary | ICD-10-CM | POA: Diagnosis not present

## 2021-08-29 DIAGNOSIS — E782 Mixed hyperlipidemia: Secondary | ICD-10-CM | POA: Diagnosis not present

## 2021-09-02 DIAGNOSIS — E1142 Type 2 diabetes mellitus with diabetic polyneuropathy: Secondary | ICD-10-CM | POA: Diagnosis not present

## 2021-09-02 DIAGNOSIS — L603 Nail dystrophy: Secondary | ICD-10-CM | POA: Diagnosis not present

## 2021-09-09 DIAGNOSIS — E119 Type 2 diabetes mellitus without complications: Secondary | ICD-10-CM | POA: Diagnosis not present

## 2021-09-14 IMAGING — RF DG LUMBAR SPINE 2-3V
1 series · 2 of 2 positions shown · non-contrast
Comparison: MRI from 12/24/2019

CLINICAL DATA: Intraoperative localization

EXAM:
LUMBAR SPINE - 2-3 VIEW

[Series 1: dg x-ray · 0.20mm/px · 2 of 2 slices shown]
[im 1/2]
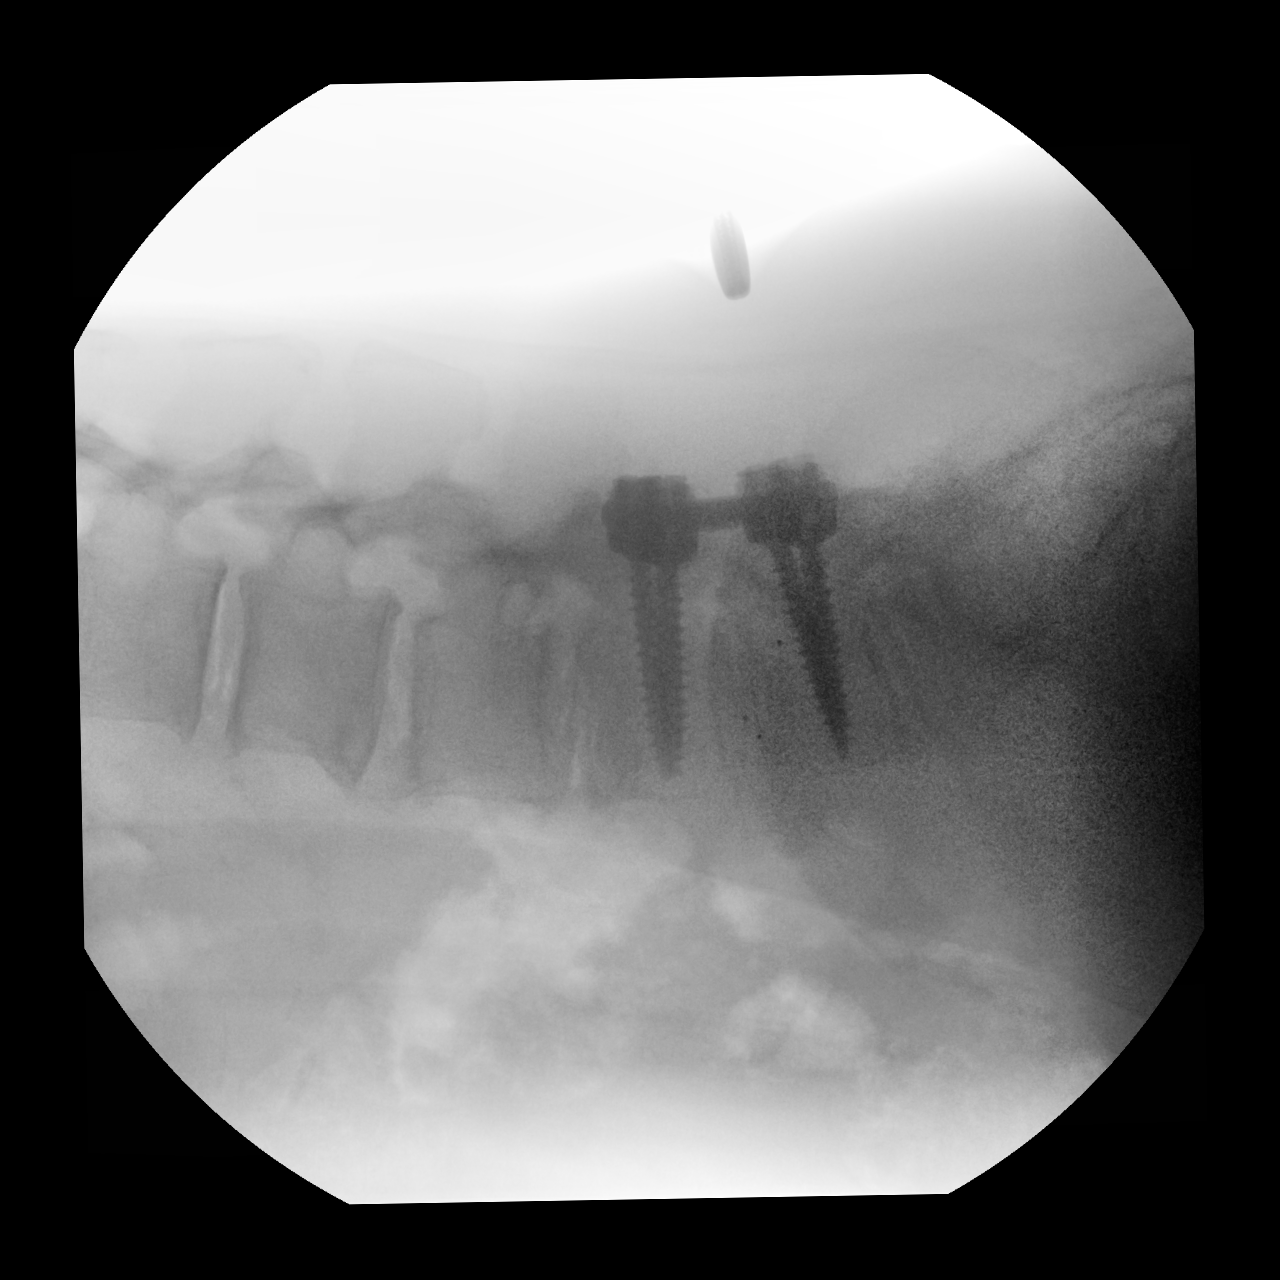
[im 2/2]
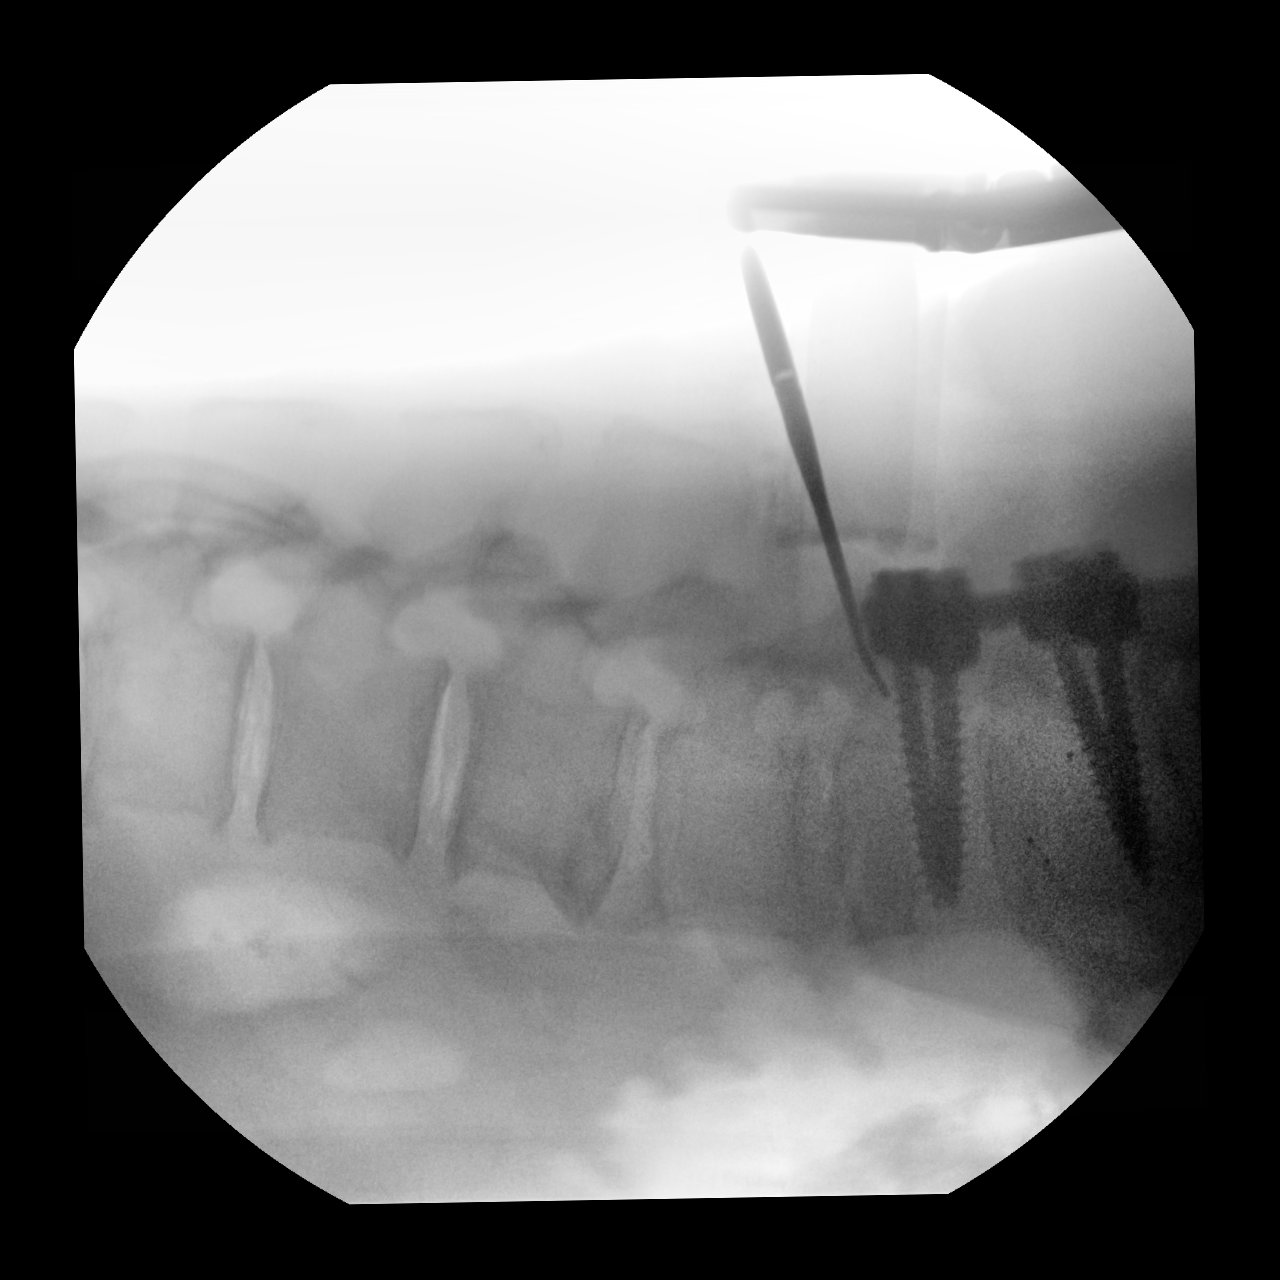

[2 of 2 positions shown; findings below may reference images not displayed]

FLUOROSCOPY TIME:  Radiation Exposure Index (as provided by the
fluoroscopic device): 3.39 mGy

If the device does not provide the exposure index:

Fluoroscopy Time:  7 seconds

Number of Acquired Images:  2
FINDINGS: Initial image demonstrates a surgical instrument at the skin level
just below the L4-5 interspace. Subsequent image shows surgical
instrument just below the L3-4 interspace. Prior fusion changes at
L4-5 are noted.
IMPRESSION: Intraoperative localization at L3-4.

## 2021-10-01 ENCOUNTER — Other Ambulatory Visit: Payer: Self-pay

## 2021-10-01 ENCOUNTER — Ambulatory Visit
Admission: RE | Admit: 2021-10-01 | Discharge: 2021-10-01 | Disposition: A | Discharge status: Home / Self Care | Payer: Medicare PPO | Source: Ambulatory Visit | Attending: Family Medicine | Admitting: Family Medicine

## 2021-10-01 DIAGNOSIS — Z1231 Encounter for screening mammogram for malignant neoplasm of breast: Secondary | ICD-10-CM | POA: Diagnosis not present

## 2021-10-02 ENCOUNTER — Other Ambulatory Visit: Payer: Self-pay | Admitting: Family Medicine

## 2021-10-02 DIAGNOSIS — N6320 Unspecified lump in the left breast, unspecified quadrant: Secondary | ICD-10-CM

## 2021-10-02 DIAGNOSIS — R928 Other abnormal and inconclusive findings on diagnostic imaging of breast: Secondary | ICD-10-CM

## 2021-10-03 DIAGNOSIS — Z23 Encounter for immunization: Secondary | ICD-10-CM | POA: Diagnosis not present

## 2021-10-13 ENCOUNTER — Other Ambulatory Visit: Payer: Self-pay | Admitting: Family Medicine

## 2021-10-13 DIAGNOSIS — I10 Essential (primary) hypertension: Secondary | ICD-10-CM

## 2021-10-13 DIAGNOSIS — R5383 Other fatigue: Secondary | ICD-10-CM

## 2021-10-13 DIAGNOSIS — E7801 Familial hypercholesterolemia: Secondary | ICD-10-CM

## 2021-10-14 ENCOUNTER — Ambulatory Visit: Payer: Self-pay | Admitting: *Deleted

## 2021-10-14 NOTE — Telephone Encounter (Signed)
Requested medication (s) are due for refill today:   Yes for all 3  Requested medication (s) are on the active medication list:   Yes for all 3  Future visit scheduled:   Yes 11/03/2021   Last ordered: All 3:  05/05/2021 #90, 1 refill  Returned because protocol criteria not met.   Labs due.     Requested Prescriptions  Pending Prescriptions Disp Refills   losartan (COZAAR) 25 MG tablet [Pharmacy Med Name: LOSARTAN POTASSIUM 25 MG Tablet] 90 tablet 1    Sig: TAKE 1 TABLET EVERY DAY     Cardiovascular:  Angiotensin Receptor Blockers Failed - 10/13/2021  6:22 AM      Failed - Cr in normal range and within 180 days    Creatinine, Ser  Date Value Ref Range Status  11/15/2020 1.14 (H) 0.57 - 1.00 mg/dL Final          Failed - K in normal range and within 180 days    Potassium  Date Value Ref Range Status  11/15/2020 5.4 (H) 3.5 - 5.2 mmol/L Final          Passed - Patient is not pregnant      Passed - Last BP in normal range    BP Readings from Last 1 Encounters:  05/05/21 112/62          Passed - Valid encounter within last 6 months    Recent Outpatient Visits           5 months ago Essential hypertension   [facility] Juline Patch, MD   10 months ago Lumbar radiculopathy   [facility] Juline Patch, MD   11 months ago Essential hypertension   [facility] Juline Patch, MD   1 year ago Essential hypertension   [place], Deanna C, MD   1 year ago Essential hypertension   [facility] Juline Patch, MD       Future Appointments             In 2 weeks Juline Patch, MD [facility]             carvedilol (COREG) 6.25 MG tablet [Pharmacy Med Name: CARVEDILOL 6.25 MG Tablet] 90 tablet 1    Sig: TAKE 1 TABLET EVERY DAY     Cardiovascular: Beta Blockers 3 Failed - 10/13/2021  6:22 AM      Failed - Cr in normal range and within 360 days    Creatinine, Ser  Date Value Ref Range  Status  11/15/2020 1.14 (H) 0.57 - 1.00 mg/dL Final          Failed - AST in normal range and within 360 days    AST  Date Value Ref Range Status  05/20/2018 16 13 - 35 Final          Failed - ALT in normal range and within 360 days    ALT  Date Value Ref Range Status  05/20/2018 16 7 - 35 Final          Passed - Last BP in normal range    BP Readings from Last 1 Encounters:  05/05/21 112/62          Passed - Last Heart Rate in normal range    Pulse Readings from Last 1 Encounters:  05/05/21 64          Passed - Valid encounter within last 6 months  Recent Outpatient Visits           5 months ago Essential hypertension   [facility] Juline Patch, MD   10 months ago Lumbar radiculopathy   [facility] Juline Patch, MD   11 months ago Essential hypertension   [facility] Juline Patch, MD   1 year ago Essential hypertension   [facility] Juline Patch, MD   1 year ago Essential hypertension   [facility] Juline Patch, MD       Future Appointments             In 2 weeks Juline Patch, MD [facility]             atorvastatin (LIPITOR) 40 MG tablet [Pharmacy Med Name: ATORVASTATIN CALCIUM 40 MG Tablet] 90 tablet 1    Sig: TAKE 1 TABLET EVERY DAY     Cardiovascular:  Antilipid - Statins Failed - 10/13/2021  6:22 AM      Failed - Lipid Panel in normal range within the last 12 months    Cholesterol, Total  Date Value Ref Range Status  05/05/2021 147 100 - 199 mg/dL Final   LDL Chol Calc (NIH)  Date Value Ref Range Status  05/05/2021 83 0 - 99 mg/dL Final   HDL  Date Value Ref Range Status  05/05/2021 39 (L) >39 mg/dL Final   Triglycerides  Date Value Ref Range Status  05/05/2021 140 0 - 149 mg/dL Final         Passed - Patient is not pregnant      Passed - Valid encounter within last 12 months    Recent Outpatient Visits           5 months ago Essential  hypertension   [place], MD   10 months ago Lumbar radiculopathy   [facility] Juline Patch, MD   11 months ago Essential hypertension   [place], Deanna C, MD   1 year ago Essential hypertension   [place], Deanna C, MD   1 year ago Essential hypertension   [place], Deanna C, MD       Future Appointments             In 2 weeks Juline Patch, MD [facility]

## 2021-10-14 NOTE — Telephone Encounter (Signed)
Pt called in with diarrhea and vomiting has had for 3 days.      Chief Complaint: Vomiting, diarrhea Symptoms:  vomiting, diarrhea Frequency: 3rd day Pertinent Negatives: Patient denies abdominal pain, dizziness, dark urine Disposition: [] ED /[] Urgent Care (no appt availability in office) / [x] Appointment(In office/virtual)/ []  [facility] [facility]/ [] Home Care/ [] Refused Recommended Disposition /[] [place] [facility]/ []  Follow-up with PCP Additional Notes: States vomited 3 times pat 24 hours, undigested food, 4-5 episodes diarrhea since yesterday, black but has been taking Pepto Bismol. Staying hydrated. Care advise given. ED for worsening symptoms. Reason for Disposition  [1] MILD or MODERATE vomiting AND [2] present > 48 hours (2 days) (Exception: mild vomiting with associated diarrhea)  Answer Assessment - Initial Assessment Questions 1. VOMITING SEVERITY: "How many times have you vomited in the past 24 hours?"     - MILD:  1 - 2 times/day    - MODERATE: 3 - 5 times/day, decreased oral intake without significant weight loss or symptoms of dehydration    - SEVERE: 6 or more times/day, vomits everything or nearly everything, with significant weight loss, symptoms of dehydration      3 2. ONSET: "When did the vomiting begin?"      3 rd day 3. FLUIDS: "What fluids or food have you vomited up today?" "Have you been able to keep any fluids down?"    Undigested food 4. ABDOMINAL PAIN: "Are your having any abdominal pain?" If yes : "How bad is it and what does it feel like?" (e.g., crampy, dull, intermittent, constant)      Burning sensation 5. DIARRHEA: "Is there any diarrhea?" If Yes, ask: "How many times today?"      4-5 times, black tarry (Has been taking Pepto Bismol which has helped with the nausea) 6. CONTACTS: "Is there anyone else in the family with the same symptoms?"      No 7. CAUSE: "What do you think is causing your vomiting?"     Unsure 8. HYDRATION STATUS: "Any  signs of dehydration?" (e.g., dry mouth [not only dry lips], too weak to stand) "When did you last urinate?"    Urine clear, WNL, good amounts 9. OTHER SYMPTOMS: "Do you have any other symptoms?" (e.g., fever, headache, vertigo, vomiting blood or coffee grounds, recent head injury)     headache  Protocols used: Vomiting-A-AH

## 2021-10-15 ENCOUNTER — Telehealth: Payer: Self-pay

## 2021-10-15 ENCOUNTER — Other Ambulatory Visit: Payer: Self-pay

## 2021-10-15 ENCOUNTER — Ambulatory Visit
Admission: RE | Admit: 2021-10-15 | Discharge: 2021-10-15 | Disposition: A | Discharge status: Home / Self Care | Payer: Medicare PPO | Source: Ambulatory Visit | Attending: Family Medicine | Admitting: Family Medicine

## 2021-10-15 ENCOUNTER — Ambulatory Visit: Payer: Medicare PPO | Admitting: Family Medicine

## 2021-10-15 DIAGNOSIS — R112 Nausea with vomiting, unspecified: Secondary | ICD-10-CM

## 2021-10-15 DIAGNOSIS — N6320 Unspecified lump in the left breast, unspecified quadrant: Secondary | ICD-10-CM | POA: Diagnosis not present

## 2021-10-15 DIAGNOSIS — R922 Inconclusive mammogram: Secondary | ICD-10-CM | POA: Diagnosis not present

## 2021-10-15 DIAGNOSIS — R928 Other abnormal and inconclusive findings on diagnostic imaging of breast: Secondary | ICD-10-CM

## 2021-10-15 MED ORDER — PROMETHAZINE HCL 25 MG PO TABS: 25.0000 mg | ORAL_TABLET | Freq: Three times a day (TID) | ORAL | 0 refills | Status: DC | PRN

## 2021-10-15 NOTE — Telephone Encounter (Signed)
Called pt and explained to her that there is a stomach bug going around that is lasting longer than the typical 48-72 hours. The black stools are, more than likely, from the pepto bismol she is taking. I also went over switching to a clear liquid diet such as broth, jello, applesauce and push fluids such as water, gatorade and ginger ale. Pt voiced understanding and I sent in some promethazine for the nausea. ?

## 2021-10-15 NOTE — Progress Notes (Signed)
Sent in promethazine

## 2021-10-21 ENCOUNTER — Telehealth: Payer: Medicare PPO

## 2021-11-03 ENCOUNTER — Ambulatory Visit (INDEPENDENT_AMBULATORY_CARE_PROVIDER_SITE_OTHER): Payer: Medicare PPO | Admitting: Family Medicine

## 2021-11-03 ENCOUNTER — Encounter: Payer: Self-pay | Admitting: Family Medicine

## 2021-11-03 ENCOUNTER — Other Ambulatory Visit: Payer: Self-pay

## 2021-11-03 VITALS — BP 120/62 | HR 84 | Ht 60.0 in | Wt 181.0 lb

## 2021-11-03 DIAGNOSIS — K219 Gastro-esophageal reflux disease without esophagitis: Secondary | ICD-10-CM

## 2021-11-03 DIAGNOSIS — R5383 Other fatigue: Secondary | ICD-10-CM | POA: Diagnosis not present

## 2021-11-03 DIAGNOSIS — E7801 Familial hypercholesterolemia: Secondary | ICD-10-CM

## 2021-11-03 DIAGNOSIS — F324 Major depressive disorder, single episode, in partial remission: Secondary | ICD-10-CM | POA: Diagnosis not present

## 2021-11-03 DIAGNOSIS — I10 Essential (primary) hypertension: Secondary | ICD-10-CM | POA: Diagnosis not present

## 2021-11-03 MED ORDER — LANSOPRAZOLE 30 MG PO CPDR: DELAYED_RELEASE_CAPSULE | ORAL | 1 refills | Status: DC

## 2021-11-03 MED ORDER — SERTRALINE HCL 50 MG PO TABS: 50.0000 mg | ORAL_TABLET | Freq: Every day | ORAL | 1 refills | Status: DC

## 2021-11-03 MED ORDER — LOSARTAN POTASSIUM 25 MG PO TABS: 25.0000 mg | ORAL_TABLET | Freq: Every day | ORAL | 1 refills | Status: DC

## 2021-11-03 MED ORDER — ATORVASTATIN CALCIUM 40 MG PO TABS: 40.0000 mg | ORAL_TABLET | Freq: Every day | ORAL | 1 refills | Status: DC

## 2021-11-03 MED ORDER — CARVEDILOL 6.25 MG PO TABS: 6.2500 mg | ORAL_TABLET | Freq: Every day | ORAL | 1 refills | Status: DC

## 2021-11-03 NOTE — Progress Notes (Signed)
? ? ?Date:  11/03/2021  ? ?Name:  Sara Carr   DOB:  [birth year]   MRN:  [id number] ? ? ?Chief Complaint: Hypertension, Anxiety, Gastroesophageal Reflux, and Hyperlipidemia ? ?Hypertension ?This is a chronic problem. The current episode started more than 1 year ago. The problem has been gradually improving since onset. The problem is controlled. Associated symptoms include anxiety and malaise/fatigue. Pertinent negatives include no blurred vision, chest pain, headaches, neck pain, orthopnea, palpitations, peripheral edema, PND, shortness of breath or sweats. Risk factors for coronary artery disease include dyslipidemia and obesity. Past treatments include beta blockers, alpha 1 blockers and angiotensin blockers. The current treatment provides moderate improvement. There are no compliance problems.  There is no history of angina, kidney disease, CAD/MI, CVA, heart failure, left ventricular hypertrophy, PVD or retinopathy. There is no history of chronic renal disease, a hypertension causing med or renovascular disease.  ?Anxiety ?Presents for follow-up visit. Patient reports no chest pain, compulsions, confusion, decreased concentration, depressed mood, dizziness, dry mouth, excessive worry, feeling of choking, hyperventilation, impotence, insomnia, irritability, malaise, muscle tension, nausea, nervous/anxious behavior, obsessions, palpitations, panic, restlessness, shortness of breath or suicidal ideas. Symptoms occur occasionally.  ? ? ?Gastroesophageal Reflux ?She reports no abdominal pain, no belching, no chest pain, no coughing, no dysphagia, no early satiety, no globus sensation, no heartburn, no hoarse voice, no nausea, no sore throat, no stridor or no wheezing. This is a chronic problem. Associated symptoms include fatigue. Pertinent negatives include no melena or muscle weakness. She has tried a PPI for the symptoms. The treatment provided moderate relief.  ?Hyperlipidemia ?This is a chronic problem. The  current episode started more than 1 year ago. The problem is controlled. Recent lipid tests were reviewed and are normal. Exacerbating diseases include diabetes. She has no history of chronic renal disease, hypothyroidism, liver disease, obesity or nephrotic syndrome. Pertinent negatives include no chest pain, myalgias or shortness of breath. Current antihyperlipidemic treatment includes statins. There are no compliance problems.   ?Diarrhea  ?This is a new problem. The current episode started 1 to 4 weeks ago. The problem occurs 2 to 4 times per day. The problem has been gradually improving. Diarrhea characteristics: improving. Pertinent negatives include no abdominal pain, arthralgias, chills, coughing, fever, headaches, myalgias, sweats or vomiting. She has tried nothing for the symptoms.  ? ?Lab Results  ?Component Value Date  ? NA 143 11/15/2020  ? K 5.4 (H) 11/15/2020  ? CO2 21 11/15/2020  ? GLUCOSE 178 (H) 11/15/2020  ? BUN 24 11/15/2020  ? CREATININE 1.14 (H) 11/15/2020  ? CALCIUM 9.5 11/15/2020  ? EGFR 50 (L) 11/15/2020  ? GFRNONAA >60 01/18/2020  ? ?Lab Results  ?Component Value Date  ? CHOL 147 05/05/2021  ? HDL 39 (L) 05/05/2021  ? [place] 83 05/05/2021  ? TRIG 140 05/05/2021  ? ?No results found for: TSH ?Lab Results  ?Component Value Date  ? HGBA1C 8.5 (H) 01/22/2020  ? ?Lab Results  ?Component Value Date  ? WBC 9.2 11/15/2020  ? HGB 14.1 11/15/2020  ? HCT 45.4 11/15/2020  ? MCV 88 11/15/2020  ? PLT 288 11/15/2020  ? ?Lab Results  ?Component Value Date  ? ALT 16 05/20/2018  ? AST 16 05/20/2018  ? ?No results found for: 25OHVITD2, [place], VD25OH  ? ?Review of Systems  ?Constitutional:  Positive for fatigue and malaise/fatigue. Negative for chills, fever, irritability and unexpected weight change.  ?HENT:  Negative for congestion, ear discharge, ear pain, hoarse voice, rhinorrhea, sinus pressure, sneezing  and sore throat.   ?Eyes:  Negative for blurred vision, photophobia, pain, discharge, redness and  itching.  ?Respiratory:  Negative for cough, shortness of breath, wheezing and stridor.   ?Cardiovascular:  Negative for chest pain, palpitations, orthopnea and PND.  ?Gastrointestinal:  Positive for diarrhea. Negative for abdominal pain, blood in stool, constipation, dysphagia, heartburn, melena, nausea and vomiting.  ?Endocrine: Negative for cold intolerance, heat intolerance, polydipsia, polyphagia and polyuria.  ?Genitourinary:  Negative for dysuria, flank pain, frequency, hematuria, impotence, menstrual problem, pelvic pain, urgency, vaginal bleeding and vaginal discharge.  ?Musculoskeletal:  Negative for arthralgias, back pain, myalgias, muscle weakness and neck pain.  ?Skin:  Negative for rash.  ?Allergic/Immunologic: Negative for environmental allergies and food allergies.  ?Neurological:  Negative for dizziness, weakness, light-headedness, numbness and headaches.  ?Hematological:  Negative for adenopathy. Does not bruise/bleed easily.  ?Psychiatric/Behavioral:  Negative for confusion, decreased concentration, dysphoric mood and suicidal ideas. The patient is not nervous/anxious and does not have insomnia.   ? ?Patient Active Problem List  ? Diagnosis Date Noted  ? Lumbar radiculopathy 01/22/2020  ? ? ?Allergies  ?Allergen Reactions  ? Penicillins Hives and Swelling  ? ? ?Past Surgical History:  ?Procedure Laterality Date  ? BACK SURGERY    ? CHOLECYSTECTOMY  1979  ? JOINT REPLACEMENT Bilateral 2017  ? LUMBAR LAMINECTOMY/ DECOMPRESSION WITH MET-RX N/A 01/22/2020  ? Procedure: L3-4 laminectomy;  Surgeon: [name], MD;  Location: [facility] ORS;  Service: Neurosurgery;  Laterality: N/A;  ? REPLACEMENT TOTAL KNEE Bilateral   ? [place] SURGERY  2004  ? ? ?Social History  ? ?Tobacco Use  ? Smoking status: Never  ? Smokeless tobacco: Never  ? Tobacco comments:  ?  none  ?Vaping Use  ? Vaping Use: Never used  ?Substance Use Topics  ? Alcohol use: Never  ? Drug use: Never  ? ? ? ?Medication list has been reviewed and  updated. ? ?Current Meds  ?Medication Sig  ? acetaminophen (TYLENOL) 325 MG tablet Take 2 tablets (650 mg total) by mouth every 4 (four) hours as needed for mild pain ((score 1 to 3) or temp > 100.5).  ? atorvastatin (LIPITOR) 40 MG tablet Take 1 tablet (40 mg total) by mouth daily.  ? calcium carbonate (TUMS - DOSED IN MG ELEMENTAL CALCIUM) 500 MG chewable tablet Chew 1 tablet by mouth daily as needed for indigestion or heartburn.  ? carvedilol (COREG) 6.25 MG tablet Take 1 tablet (6.25 mg total) by mouth daily.  ? Continuous Blood Gluc Sensor (FREESTYLE LIBRE 14 DAY SENSOR) MISC   ? docusate sodium (COLACE) 50 MG capsule Take 1 capsule (50 mg total) by mouth daily.  ? Dulaglutide 1.5 MG/0.5ML SOPN Inject into the skin. Inject 0.5 mLs (1.5 mg total) subcutaneously every 7 (seven) days  ? gabapentin (NEURONTIN) 800 MG tablet Take 800 mg by mouth 2 (two) times daily as needed (pain). [place]  ? INVOKAMET XR 150-500 MG TB24 Take 1 tablet by mouth in the morning and at bedtime. 2 at breakfast 1 at night  ? lansoprazole (PREVACID) 30 MG capsule Take 1 capsule daily  ? levothyroxine (SYNTHROID) 75 MCG tablet Take 75 mcg by mouth daily before breakfast. Takes 75 mcg tues-Sunday and takes 112.5 mcf (1.5 tabs) on Monday  ? losartan (COZAAR) 25 MG tablet Take 1 tablet (25 mg total) by mouth daily.  ? Multiple Vitamins-Minerals (ONE-A-DAY VITACRAVES ADULT) CHEW Chew 1 tablet by mouth daily. Patient take gummy  ? promethazine (PHENERGAN) 25 MG tablet Take  1 tablet (25 mg total) by mouth every 8 (eight) hours as needed for nausea or vomiting.  ? sertraline (ZOLOFT) 50 MG tablet Take 1 tablet (50 mg total) by mouth daily.  ? ? ?PHQ 2/9 Scores 11/03/2021 08/13/2021 05/05/2021 11/15/2020  ?PHQ - 2 Score 2 0 0 0  ?PHQ- 9 Score 8 - 0 4  ? ? ?GAD 7 : Generalized Anxiety Score 11/03/2021 05/05/2021 11/15/2020 05/30/2020  ?Nervous, Anxious, on Edge 0 0 1 0  ?Control/stop worrying 1 0 0 0  ?Worry too much - different things 1 0 0 0  ?Trouble  relaxing 2 0 0 0  ?Restless 0 0 0 0  ?Easily annoyed or irritable 1 0 2 0  ?Afraid - awful might happen 0 0 2 0  ?Total GAD 7 Score 5 0 5 0  ?Anxiety Difficulty - - Not difficult at all -  ? ? ?BP Readings

## 2021-11-10 ENCOUNTER — Encounter: Payer: Self-pay | Admitting: Family Medicine

## 2021-11-10 ENCOUNTER — Other Ambulatory Visit
Admission: RE | Admit: 2021-11-10 | Discharge: 2021-11-10 | Disposition: A | Discharge status: Home / Self Care | Payer: Medicare PPO | Attending: Family Medicine | Admitting: Family Medicine

## 2021-11-10 ENCOUNTER — Ambulatory Visit: Payer: Medicare PPO | Admitting: Family Medicine

## 2021-11-10 ENCOUNTER — Ambulatory Visit: Payer: Self-pay | Admitting: *Deleted

## 2021-11-10 ENCOUNTER — Other Ambulatory Visit: Payer: Self-pay

## 2021-11-10 VITALS — BP 132/82 | HR 127 | Ht 60.0 in | Wt 176.0 lb

## 2021-11-10 DIAGNOSIS — E860 Dehydration: Secondary | ICD-10-CM

## 2021-11-10 DIAGNOSIS — Z8639 Personal history of other endocrine, nutritional and metabolic disease: Secondary | ICD-10-CM

## 2021-11-10 DIAGNOSIS — R051 Acute cough: Secondary | ICD-10-CM | POA: Diagnosis not present

## 2021-11-10 DIAGNOSIS — R42 Dizziness and giddiness: Secondary | ICD-10-CM

## 2021-11-10 DIAGNOSIS — R000 Tachycardia, unspecified: Secondary | ICD-10-CM

## 2021-11-10 LAB — RENAL FUNCTION PANEL
Albumin: 4.3 g/dL (ref 3.5–5.0)
Anion gap: 10 (ref 5–15)
BUN: 23 mg/dL (ref 8–23)
CO2: 24 mmol/L (ref 22–32)
Calcium: 8.8 mg/dL — ABNORMAL LOW (ref 8.9–10.3)
Chloride: 101 mmol/L (ref 98–111)
Creatinine, Ser: 1.06 mg/dL — ABNORMAL HIGH (ref 0.44–1.00)
GFR, Estimated: 54 mL/min — ABNORMAL LOW (ref >60)
Glucose, Bld: 151 mg/dL — ABNORMAL HIGH (ref 70–99)
Phosphorus: 2.5 mg/dL (ref 2.5–4.6)
Potassium: 4 mmol/L (ref 3.5–5.1)
Sodium: 135 mmol/L (ref 135–145)

## 2021-11-10 MED ORDER — MECLIZINE HCL 25 MG PO TABS: 25.0000 mg | ORAL_TABLET | Freq: Three times a day (TID) | ORAL | 0 refills | Status: DC | PRN

## 2021-11-10 NOTE — Progress Notes (Signed)
? ? ?Date:  11/10/2021  ? ?Name:  Sara Carr   DOB:  02/29/1946   MRN:  [id number] ? ? ?Chief Complaint: Dizziness ? ?Dizziness ?This is a new problem. The current episode started in the past 7 days (3 days ago). The problem has been waxing and waning. Associated symptoms include coughing, diaphoresis, nausea and vertigo. Pertinent negatives include no abdominal pain, anorexia, arthralgias, change in bowel habit, chest pain, chills, congestion, fatigue, fever, headaches, joint swelling, myalgias, neck pain, numbness, rash, sore throat, swollen glands, urinary symptoms, visual change, vomiting or weakness. The symptoms are aggravated by bending (turn my head/roll over in bed). She has tried acetaminophen for the symptoms. The treatment provided moderate (helps headache) relief.  ?Cough ?This is a new problem. Episode onset: 2 days. The cough is Productive of sputum. Associated symptoms include nasal congestion, rhinorrhea and shortness of breath. Pertinent negatives include no chest pain, chills, ear pain, fever, headaches, myalgias, rash, sore throat or wheezing. There is no history of environmental allergies.  ? ?Lab Results  ?Component Value Date  ? NA 143 11/15/2020  ? K 5.4 (H) 11/15/2020  ? CO2 21 11/15/2020  ? GLUCOSE 178 (H) 11/15/2020  ? BUN 24 11/15/2020  ? CREATININE 1.14 (H) 11/15/2020  ? CALCIUM 9.5 11/15/2020  ? EGFR 50 (L) 11/15/2020  ? GFRNONAA >60 01/18/2020  ? ?Lab Results  ?Component Value Date  ? CHOL 147 05/05/2021  ? HDL 39 (L) 05/05/2021  ? [place] 83 05/05/2021  ? TRIG 140 05/05/2021  ? ?No results found for: TSH ?Lab Results  ?Component Value Date  ? HGBA1C 8.5 (H) 01/22/2020  ? ?Lab Results  ?Component Value Date  ? WBC 9.2 11/15/2020  ? HGB 14.1 11/15/2020  ? HCT 45.4 11/15/2020  ? MCV 88 11/15/2020  ? PLT 288 11/15/2020  ? ?Lab Results  ?Component Value Date  ? ALT 16 05/20/2018  ? AST 16 05/20/2018  ? ?No results found for: 25OHVITD2, [place], VD25OH  ? ?Review of Systems   ?Constitutional:  Positive for diaphoresis. Negative for chills, fatigue and fever.  ?HENT:  Positive for rhinorrhea. Negative for congestion, drooling, ear discharge, ear pain and sore throat.   ?Respiratory:  Positive for cough and shortness of breath. Negative for wheezing.   ?Cardiovascular:  Negative for chest pain, palpitations and leg swelling.  ?Gastrointestinal:  Positive for nausea. Negative for abdominal pain, anorexia, blood in stool, change in bowel habit, constipation, diarrhea and vomiting.  ?Endocrine: Negative for polydipsia.  ?Genitourinary:  Negative for dysuria, frequency, hematuria and urgency.  ?Musculoskeletal:  Negative for arthralgias, back pain, joint swelling, myalgias and neck pain.  ?Skin:  Negative for rash.  ?Allergic/Immunologic: Negative for environmental allergies.  ?Neurological:  Positive for dizziness and vertigo. Negative for weakness, numbness and headaches.  ?Hematological:  Does not bruise/bleed easily.  ?Psychiatric/Behavioral:  Negative for suicidal ideas. The patient is not nervous/anxious.   ? ?Patient Active Problem List  ? Diagnosis Date Noted  ? Lumbar radiculopathy 01/22/2020  ? ? ?Allergies  ?Allergen Reactions  ? Penicillins Hives and Swelling  ? ? ?Past Surgical History:  ?Procedure Laterality Date  ? BACK SURGERY    ? CHOLECYSTECTOMY  1979  ? JOINT REPLACEMENT Bilateral 2017  ? LUMBAR LAMINECTOMY/ DECOMPRESSION WITH MET-RX N/A 01/22/2020  ? Procedure: L3-4 laminectomy;  Surgeon: [name], MD;  Location: [facility] ORS;  Service: Neurosurgery;  Laterality: N/A;  ? REPLACEMENT TOTAL KNEE Bilateral   ? [place] SURGERY  2004  ? ? ?Social History  ? ?  Tobacco Use  ? Smoking status: Never  ? Smokeless tobacco: Never  ? Tobacco comments:  ?  none  ?Vaping Use  ? Vaping Use: Never used  ?Substance Use Topics  ? Alcohol use: Never  ? Drug use: Never  ? ? ? ?Medication list has been reviewed and updated. ? ?Current Meds  ?Medication Sig  ? acetaminophen (TYLENOL) 325 MG tablet  Take 2 tablets (650 mg total) by mouth every 4 (four) hours as needed for mild pain ((score 1 to 3) or temp > 100.5).  ? atorvastatin (LIPITOR) 40 MG tablet Take 1 tablet (40 mg total) by mouth daily.  ? calcium carbonate (TUMS - DOSED IN MG ELEMENTAL CALCIUM) 500 MG chewable tablet Chew 1 tablet by mouth daily as needed for indigestion or heartburn.  ? carvedilol (COREG) 6.25 MG tablet Take 1 tablet (6.25 mg total) by mouth daily.  ? Continuous Blood Gluc Sensor (FREESTYLE LIBRE 14 DAY SENSOR) MISC   ? docusate sodium (COLACE) 50 MG capsule Take 1 capsule (50 mg total) by mouth daily.  ? Dulaglutide 1.5 MG/0.5ML SOPN Inject into the skin. Inject 0.5 mLs (1.5 mg total) subcutaneously every 7 (seven) days  ? gabapentin (NEURONTIN) 800 MG tablet Take 800 mg by mouth 2 (two) times daily as needed (pain). [place]  ? INVOKAMET XR 150-500 MG TB24 Take 1 tablet by mouth in the morning and at bedtime. 2 at breakfast 1 at night  ? lansoprazole (PREVACID) 30 MG capsule Take 1 capsule daily  ? levothyroxine (SYNTHROID) 75 MCG tablet Take 75 mcg by mouth daily before breakfast. Takes 75 mcg tues-Sunday and takes 112.5 mcf (1.5 tabs) on Monday  ? losartan (COZAAR) 25 MG tablet Take 1 tablet (25 mg total) by mouth daily.  ? Multiple Vitamins-Minerals (ONE-A-DAY VITACRAVES ADULT) CHEW Chew 1 tablet by mouth daily. Patient take gummy  ? sertraline (ZOLOFT) 50 MG tablet Take 1 tablet (50 mg total) by mouth daily.  ? ? ? ?  11/03/2021  ?  8:55 AM 08/13/2021  ?  2:22 PM 05/05/2021  ? 10:23 AM 11/15/2020  ? 10:59 AM  ?PHQ 2/9 Scores  ?PHQ - 2 Score 2 0 0 0  ?PHQ- 9 Score 8  0 4  ? ? ? ?  11/03/2021  ?  8:56 AM 05/05/2021  ? 10:23 AM 11/15/2020  ? 11:00 AM 05/30/2020  ?  1:26 PM  ?GAD 7 : Generalized Anxiety Score  ?Nervous, Anxious, on Edge 0 0 1 0  ?Control/stop worrying 1 0 0 0  ?Worry too much - different things 1 0 0 0  ?Trouble relaxing 2 0 0 0  ?Restless 0 0 0 0  ?Easily annoyed or irritable 1 0 2 0  ?Afraid - awful might happen 0 0 2  0  ?Total GAD 7 Score 5 0 5 0  ?Anxiety Difficulty   Not difficult at all   ? ? ?BP Readings from Last 3 Encounters:  ?11/10/21 132/82  ?11/03/21 120/62  ?05/05/21 112/62  ? ? ?Physical Exam ?Vitals and nursing note reviewed.  ?Constitutional:   ?   Appearance: She is well-developed.  ?HENT:  ?   Head: Normocephalic.  ?   Right Ear: Tympanic membrane, ear canal and external ear normal.  ?   Left Ear: Tympanic membrane, ear canal and external ear normal.  ?Eyes:  ?   General: Lids are everted, no foreign bodies appreciated. No scleral icterus.    ?   Left eye: No foreign body  or hordeolum.  ?   Conjunctiva/sclera: Conjunctivae normal.  ?   Right eye: Right conjunctiva is not injected.  ?   Left eye: Left conjunctiva is not injected.  ?   Pupils: Pupils are equal, round, and reactive to light.  ?Neck:  ?   Thyroid: No thyromegaly.  ?   Vascular: No JVD.  ?   Trachea: No tracheal deviation.  ?Cardiovascular:  ?   Rate and Rhythm: Normal rate and regular rhythm.  ?   Heart sounds: Normal heart sounds. No murmur heard. ?  No friction rub. No gallop.  ?Pulmonary:  ?   Effort: Pulmonary effort is normal. No respiratory distress.  ?   Breath sounds: Normal breath sounds. No wheezing, rhonchi or rales.  ?Chest:  ?   Chest wall: No tenderness.  ?Abdominal:  ?   General: Bowel sounds are normal.  ?   Palpations: Abdomen is soft. There is no mass.  ?   Tenderness: There is no abdominal tenderness. There is no guarding or rebound.  ?Musculoskeletal:     ?   General: No tenderness. Normal range of motion.  ?   Cervical back: Normal range of motion and neck supple.  ?Lymphadenopathy:  ?   Cervical: No cervical adenopathy.  ?Skin: ?   General: Skin is warm.  ?   Findings: No rash.  ?Neurological:  ?   Mental Status: She is alert and oriented to person, place, and time.  ?   Cranial Nerves: No cranial nerve deficit.  ?   Deep Tendon Reflexes: Reflexes normal.  ?Psychiatric:     ?   Mood and Affect: Mood is not anxious or depressed.   ? ? ?Wt Readings from Last 3 Encounters:  ?11/10/21 176 lb (79.8 kg)  ?11/03/21 181 lb (82.1 kg)  ?05/05/21 188 lb (85.3 kg)  ? ? ?BP 132/82   Pulse (!) 127   Ht 5' (1.524 m)   Wt 176 lb (79.8 kg)   BMI 34.37 kg/m?

## 2021-11-10 NOTE — Telephone Encounter (Signed)
?  Chief Complaint: dizziness ?Symptoms: spinning ?Frequency: almost continuous with any movement ?Pertinent Negatives: Patient denies fever ?Disposition: [] ED /[] Urgent Care (no appt availability in office) / [x] Appointment(In office/virtual)/ []  [facility]/ [] Home Care/ [] Refused Recommended Disposition /[] [place] [facility]/ []  Follow-up with PCP ?Additional Notes: Pt started with dizziness over weekend, is able to keep food down at this point, appt this morning. ? ? ?Reason for Disposition ? [1] MODERATE dizziness (e.g., vertigo; feels very unsteady, interferes with normal activities) AND [2] has NOT been evaluated by physician for this ? ?Answer Assessment - Initial Assessment Questions ?1. DESCRIPTION: "Describe your dizziness." ?    Spinning ?2. VERTIGO: "Do you feel like either you or the room is spinning or tilting?"  ?    spinning ?3. LIGHTHEADED: "Do you feel lightheaded?" (e.g., somewhat faint, woozy, weak upon standing) ?    yes ?4. SEVERITY: "How bad is it?"  "Can you walk?" ?  - MILD: Feels slightly dizzy and unsteady, but is walking normally. ?  - MODERATE: Feels unsteady when walking, but not falling; interferes with normal activities (e.g., school, work). ?  - SEVERE: Unable to walk without falling, or requires assistance to walk without falling. ?    8 ?5. ONSET:  "When did the dizziness begin?" ?    3 days, continuous ?6. AGGRAVATING FACTORS: "Does anything make it worse?" (e.g., standing, change in head position) ?    movement ?7. CAUSE: "What do you think is causing the dizziness?" ?    Had before ?8. RECURRENT SYMPTOM: "Have you had dizziness before?" If Yes, ask: "When was the last time?" "What happened that time?" ?    Yes, a year ago. ?9. OTHER SYMPTOMS: "Do you have any other symptoms?" (e.g., headache, weakness, numbness, vomiting, earache) ?    Was vomiting, able to keep apple sauce, gatorade down ?10. PREGNANCY: "Is there any chance you are pregnant?" "When was your  last menstrual period?" ?      na ? ?Protocols used: Dizziness - Vertigo-A-AH ? ?

## 2021-12-02 DIAGNOSIS — L603 Nail dystrophy: Secondary | ICD-10-CM | POA: Diagnosis not present

## 2021-12-02 DIAGNOSIS — E1142 Type 2 diabetes mellitus with diabetic polyneuropathy: Secondary | ICD-10-CM | POA: Diagnosis not present

## 2021-12-16 ENCOUNTER — Other Ambulatory Visit: Payer: Self-pay | Admitting: Family Medicine

## 2021-12-16 NOTE — Telephone Encounter (Signed)
Medication Refill - Medication: gabapentin (NEURONTIN) 800 MG tablet ?levothyroxine (SYNTHROID) 75 MCG tablet  ?Has the patient contacted their pharmacy? Yes.   ?(Agent: If no, request that the patient contact the pharmacy for the refill. If patient does not wish to contact the pharmacy document the reason why and proceed with request.) ?(Agent: If yes, when and what did the pharmacy advise?) ? ?Preferred Pharmacy (with phone number or street name):  ?[address], [address]  ?[place] [address]  ?Phone: [phone] Fax: [phone]  ? ?Has the patient been seen for an appointment in the last year OR does the patient have an upcoming appointment? Yes.   ? ?Agent: Please be advised that RX refills may take up to 3 business days. We ask that you follow-up with your pharmacy. ? ?

## 2021-12-17 NOTE — Telephone Encounter (Signed)
Requested medication (s) are due for refill today: - ? ?Requested medication (s) are on the active medication list: historical med for both ? ?Last refill:  gabapentin:06/27/19      levothyroxine:12/31/19 ? ?Future visit scheduled: yes ? ?Notes to clinic:  labs overdue historical meds ? ? ?Requested Prescriptions  ?Pending Prescriptions Disp Refills  ? gabapentin (NEURONTIN) 800 MG tablet    ?  Sig: Take 1 tablet (800 mg total) by mouth 2 (two) times daily as needed (pain). [place]  ?  ? Neurology: Anticonvulsants - gabapentin Failed - 12/16/2021  1:23 PM  ?  ?  Failed - Cr in normal range and within 360 days  ?  Creatinine, Ser  ?Date Value Ref Range Status  ?11/10/2021 1.06 (H) 0.44 - 1.00 mg/dL Final  ?  ?  ?  ?  Passed - Completed PHQ-2 or PHQ-9 in the last 360 days  ?  ?  Passed - Valid encounter within last 12 months  ?  Recent Outpatient Visits   ? ?      ? 1 month ago Vertigo  ? [facility] Duanne Limerick, MD  ? 1 month ago Essential hypertension  ? [facility] Duanne Limerick, MD  ? 7 months ago Essential hypertension  ? [facility] Duanne Limerick, MD  ? 1 year ago Lumbar radiculopathy  ? [facility] Duanne Limerick, MD  ? 1 year ago Essential hypertension  ? [facility] Duanne Limerick, MD  ? ?  ?  ?Future Appointments   ? ?        ? In 4 months Duanne Limerick, MD [facility]  ? ?  ? ? ?  ?  ?  ? levothyroxine (SYNTHROID) 75 MCG tablet    ?  Sig: Take 1 tablet (75 mcg total) by mouth daily before breakfast. Takes 75 mcg tues-Sunday and takes 112.5 mcf (1.5 tabs) on Monday  ?  ? Endocrinology:  Hypothyroid Agents Failed - 12/16/2021  1:23 PM  ?  ?  Failed - TSH in normal range and within 360 days  ?  No results found for: TSH, POCTSH, TSHREFLEX  ?  ?  ?  Passed - Valid encounter within last 12 months  ?  Recent Outpatient Visits   ? ?      ? 1 month ago Vertigo  ? [facility] Duanne Limerick, MD  ? 1 month ago Essential hypertension   ? [facility] Duanne Limerick, MD  ? 7 months ago Essential hypertension  ? [facility] Duanne Limerick, MD  ? 1 year ago Lumbar radiculopathy  ? [facility] Duanne Limerick, MD  ? 1 year ago Essential hypertension  ? [facility] Duanne Limerick, MD  ? ?  ?  ?Future Appointments   ? ?        ? In 4 months Duanne Limerick, MD [facility]  ? ?  ? ? ?  ?  ?  ? ? ? ? ?

## 2022-02-02 DIAGNOSIS — E1165 Type 2 diabetes mellitus with hyperglycemia: Secondary | ICD-10-CM | POA: Diagnosis not present

## 2022-02-02 DIAGNOSIS — E782 Mixed hyperlipidemia: Secondary | ICD-10-CM | POA: Diagnosis not present

## 2022-02-02 DIAGNOSIS — E1159 Type 2 diabetes mellitus with other circulatory complications: Secondary | ICD-10-CM | POA: Diagnosis not present

## 2022-02-02 DIAGNOSIS — I152 Hypertension secondary to endocrine disorders: Secondary | ICD-10-CM | POA: Diagnosis not present

## 2022-02-02 DIAGNOSIS — E063 Autoimmune thyroiditis: Secondary | ICD-10-CM | POA: Diagnosis not present

## 2022-03-03 DIAGNOSIS — L603 Nail dystrophy: Secondary | ICD-10-CM | POA: Diagnosis not present

## 2022-03-03 DIAGNOSIS — M79675 Pain in left toe(s): Secondary | ICD-10-CM | POA: Diagnosis not present

## 2022-03-03 DIAGNOSIS — E1142 Type 2 diabetes mellitus with diabetic polyneuropathy: Secondary | ICD-10-CM | POA: Diagnosis not present

## 2022-03-03 DIAGNOSIS — M79674 Pain in right toe(s): Secondary | ICD-10-CM | POA: Diagnosis not present

## 2022-03-21 ENCOUNTER — Encounter: Payer: Self-pay | Admitting: Emergency Medicine

## 2022-03-21 ENCOUNTER — Other Ambulatory Visit: Payer: Self-pay

## 2022-03-21 ENCOUNTER — Emergency Department
Admission: EM | Admit: 2022-03-21 | Discharge: 2022-03-21 | Disposition: A | Discharge status: Home / Self Care | Payer: Medicare PPO | Attending: Emergency Medicine | Admitting: Emergency Medicine

## 2022-03-21 ENCOUNTER — Emergency Department: Payer: Medicare PPO

## 2022-03-21 DIAGNOSIS — L03114 Cellulitis of left upper limb: Secondary | ICD-10-CM | POA: Insufficient documentation

## 2022-03-21 DIAGNOSIS — I10 Essential (primary) hypertension: Secondary | ICD-10-CM | POA: Diagnosis not present

## 2022-03-21 DIAGNOSIS — E119 Type 2 diabetes mellitus without complications: Secondary | ICD-10-CM | POA: Insufficient documentation

## 2022-03-21 DIAGNOSIS — M1812 Unilateral primary osteoarthritis of first carpometacarpal joint, left hand: Secondary | ICD-10-CM | POA: Diagnosis not present

## 2022-03-21 DIAGNOSIS — E039 Hypothyroidism, unspecified: Secondary | ICD-10-CM | POA: Insufficient documentation

## 2022-03-21 DIAGNOSIS — M25532 Pain in left wrist: Secondary | ICD-10-CM | POA: Diagnosis present

## 2022-03-21 MED ORDER — ONDANSETRON 4 MG PO TBDP: 4.0000 mg | ORAL_TABLET | Freq: Four times a day (QID) | ORAL | 0 refills | Status: DC | PRN

## 2022-03-21 MED ORDER — DOXYCYCLINE HYCLATE 100 MG PO CAPS: 100.0000 mg | ORAL_CAPSULE | Freq: Two times a day (BID) | ORAL | 0 refills | Status: DC

## 2022-03-21 NOTE — Discharge Instructions (Addendum)
You have been seen today in the Emergency Department (ED) for cellulitis, a superficial skin infection over your left wrist. Please take your antibiotics as prescribed for their ENTIRE prescribed duration.  Take Tylenol or Motrin as needed for pain, but only as written on the box.   Please follow up with your doctor or in the ED in 48-72 hours for recheck of your infection if you are not improving.  Call your doctor sooner or return to the ED if you develop worsening signs of infection such as: increased redness, increased pain, pus, fever, or other symptoms that concern you.

## 2022-03-21 NOTE — ED Notes (Signed)
Pt to ED with husband for redness and pain to L hand and wrist since yesterday. Pt denies any known injury or trigger. States is painful to move fingers. Posterior hand and wrist appear red. No swelling noted.

## 2022-03-21 NOTE — ED Triage Notes (Signed)
Patient to ED for left wrist pain that started yesterday. No known injury. Redness noted to top of hand. Possible bug bite per family.

## 2022-03-21 NOTE — ED Provider Notes (Signed)
[facility] Provider Note    Event Date/Time   First MD Initiated Contact with Patient 03/21/22 1405     (approximate)   History   Wrist Pain   HPI  Sara Carr is a 76 y.o. female   medical history of arthritis anemia diabetes hypertension hypothyroidism  She has had redness and soreness over her left top of the wrist for about 2 days.  Noticed yesterday that she had a small white spot in that she and her husband felt like she might had a bug bite in the area now there is redness spreading around it.  That sore to move her wrist back-and-forth, and the area is red.  No fevers or chills no nausea or vomiting.  No numbness or weakness in the hand.  There is no swelling of the fingers of the hand.  Though they did not see any particular bug or tick, they felt like she might of got bit by something as there was a tiny white spot and now the redness is started where that was area is red about the size of her palm      Physical Exam   Triage Vital Signs: ED Triage Vitals  Enc Vitals Group     BP 03/21/22 1214 (!) 169/89     Pulse Rate 03/21/22 1214 (!) 103     Resp 03/21/22 1214 18     Temp 03/21/22 1214 98.8 F (37.1 C)     Temp Source 03/21/22 1214 Oral     SpO2 03/21/22 1214 95 %     Weight 03/21/22 1215 174 lb (78.9 kg)     Height 03/21/22 1215 5' (1.524 m)     Head Circumference --      Peak Flow --      Pain Score 03/21/22 1214 10     Pain Loc --      Pain Edu? --      Excl. in GC? --     Most recent vital signs: Vitals:   03/21/22 1214  BP: (!) 169/89  Pulse: (!) 103  Resp: 18  Temp: 98.8 F (37.1 C)  SpO2: 95%     General: Awake, no distress.  CV:  Good peripheral perfusion.  Strong left radial pulse.  Normal capillary refill all digits left hand Resp:  Normal effort.  Abd:  No distention.  Other:    LEFT Left upper extremity demonstrates normal strength, good use of all muscles though some pain across the dorsal surface  of the left wrist when she runs through range of motion. No edema bruising or contusions of the left shoulder/upper arm, left elbow, left forearm / hand.  There is however area of warmth and mild erythema of the skin without puncture or skin breakdown about the size of a 5 x 5 cm over top of the wrist.  There is no joint effusion.  Full range of motion of the left  upper extremity without pain with exception to wrist.  There is no circumferential swelling, there is no joint effusion involving the left wrist.  There is no erythema or warmth over the volar surface.  No evidence of trauma. Strong radial pulse. Intact median/ulnar/radial neuro-muscular exam.  No pain through range of motion of flexion of the fingers of the hand, there is some pain with extension of the fingers.  The hand and fingers appear normal.  The patient removed her wedding ring and gave to her husband and has no other  rings on the left hand    ED Results / Procedures / Treatments   Labs (all labs ordered are listed, but only abnormal results are displayed) Labs Reviewed - No data to display   EKG     RADIOLOGY X-ray of the left wrist interpreted by me as normal    PROCEDURES:  Critical Care performed: No  Procedures   MEDICATIONS ORDERED IN ED: Medications - No data to display   IMPRESSION / MDM / ASSESSMENT AND PLAN / ED COURSE  I reviewed the triage vital signs and the nursing notes.                              Differential diagnosis includes, but is not limited to, cellulitis, possible bug bite with secondary infection, tenosynovitis, or other soft tissue infection etc.  No clinical exam findings to suggest joint infection.  No trauma or injury.  Exam does not show evidence that would be supportive of tenosynovitis or flexor tenosynovitis, some tenderness through flexion of the hand but pain is in the area where the cellulitic change of his evidence.  Will treat with doxycycline, allergic to penicillin.   Discussed with patient and her husband careful return precautions.  I also recommended that they should follow-up with [facility] clinic Monday for reexam or her primary doctor.  Dr. Okey Dupre, [facility], on-call and patient given follow-up recommendations to follow-up with his clinic.  At this juncture no evidence of joint infection or obvious tendon infection or deep space infection, suspect cellulitis.  Treating with doxycycline with careful return precautions  Return precautions and treatment recommendations and follow-up discussed with the patient who is agreeable with the plan.   Patient's presentation is most consistent with acute complicated illness / injury requiring diagnostic workup.  Afebrile.  No evidence of sepsis.  Discussed pain control options: Patient vies she actually has a prescription for Tylenol with codeine from recent dental extraction which she will utilize   FINAL CLINICAL IMPRESSION(S) / ED DIAGNOSES   Final diagnoses:  Cellulitis of left upper extremity     Rx / DC Orders   ED Discharge Orders          Ordered    doxycycline (VIBRAMYCIN) 100 MG capsule  2 times daily        03/21/22 1432    ondansetron (ZOFRAN-ODT) 4 MG disintegrating tablet  Every 6 hours PRN        03/21/22 1432             Note:  This document was prepared using Dragon voice recognition software and may include unintentional dictation errors.   Sharyn Creamer, MD 03/21/22 1440

## 2022-03-24 ENCOUNTER — Encounter: Payer: Self-pay | Admitting: Family Medicine

## 2022-03-24 ENCOUNTER — Ambulatory Visit (INDEPENDENT_AMBULATORY_CARE_PROVIDER_SITE_OTHER): Payer: Medicare PPO | Admitting: Family Medicine

## 2022-03-24 VITALS — BP 132/86 | HR 80 | Ht 60.0 in | Wt 165.0 lb

## 2022-03-24 DIAGNOSIS — L03114 Cellulitis of left upper limb: Secondary | ICD-10-CM

## 2022-03-24 NOTE — Progress Notes (Signed)
Date:  03/24/2022   Name:  Sara Carr   DOB:  [birth year]   MRN:  [id number]   Chief Complaint: Follow-up (Seen in ER on 03/21/22 for cellulitis- was put on Doxy)  Rash This is a new problem. The current episode started in the past 7 days. The problem has been gradually improving since onset. The affected locations include the left hand and left wrist. Pertinent negatives include no fever or shortness of breath. (Tenderness and pain) Past treatments include antibiotics (doxycycline). The treatment provided mild relief.    Lab Results  Component Value Date   NA 135 11/10/2021   K 4.0 11/10/2021   CO2 24 11/10/2021   GLUCOSE 151 (H) 11/10/2021   BUN 23 11/10/2021   CREATININE 1.06 (H) 11/10/2021   CALCIUM 8.8 (L) 11/10/2021   EGFR 50 (L) 11/15/2020   GFRNONAA 54 (L) 11/10/2021   Lab Results  Component Value Date   CHOL 147 05/05/2021   HDL 39 (L) 05/05/2021   LDLCALC 83 05/05/2021   TRIG 140 05/05/2021   No results found for: "TSH" Lab Results  Component Value Date   HGBA1C 8.5 (H) 01/22/2020   Lab Results  Component Value Date   WBC 9.2 11/15/2020   HGB 14.1 11/15/2020   HCT 45.4 11/15/2020   MCV 88 11/15/2020   PLT 288 11/15/2020   Lab Results  Component Value Date   ALT 16 05/20/2018   AST 16 05/20/2018   No results found for: "25OHVITD2", "25OHVITD3", "VD25OH"   Review of Systems  Constitutional:  Negative for fever.  Respiratory:  Negative for shortness of breath and wheezing.   Cardiovascular:  Negative for chest pain.  Gastrointestinal:  Negative for abdominal pain.  Genitourinary:  Negative for frequency and urgency.  Musculoskeletal:  Positive for arthralgias and myalgias.  Skin:  Positive for rash.    Patient Active Problem List   Diagnosis Date Noted   Lumbar radiculopathy 01/22/2020    Allergies  Allergen Reactions   Penicillins Hives and Swelling    Past Surgical History:  Procedure Laterality Date   BACK SURGERY      CHOLECYSTECTOMY  1979   JOINT REPLACEMENT Bilateral 2017   LUMBAR LAMINECTOMY/ DECOMPRESSION WITH MET-RX N/A 01/22/2020   Procedure: L3-4 laminectomy;  Surgeon: [name], MD;  Location: [facility] ORS;  Service: Neurosurgery;  Laterality: N/A;   REPLACEMENT TOTAL KNEE Bilateral    SPINE SURGERY  2004    Social History   Tobacco Use   Smoking status: Never   Smokeless tobacco: Never   Tobacco comments:    none  Vaping Use   Vaping Use: Never used  Substance Use Topics   Alcohol use: Never   Drug use: Never     Medication list has been reviewed and updated.  Current Meds  Medication Sig   acetaminophen (TYLENOL) 325 MG tablet Take 2 tablets (650 mg total) by mouth every 4 (four) hours as needed for mild pain ((score 1 to 3) or temp > 100.5).   atorvastatin (LIPITOR) 40 MG tablet Take 1 tablet (40 mg total) by mouth daily.   calcium carbonate (TUMS - DOSED IN MG ELEMENTAL CALCIUM) 500 MG chewable tablet Chew 1 tablet by mouth daily as needed for indigestion or heartburn.   carvedilol (COREG) 6.25 MG tablet Take 1 tablet (6.25 mg total) by mouth daily.   Continuous Blood Gluc Sensor (FREESTYLE LIBRE 14 DAY SENSOR) MISC    docusate sodium (COLACE) 50 MG capsule Take 1 capsule (  50 mg total) by mouth daily.   doxycycline (VIBRAMYCIN) 100 MG capsule Take 1 capsule (100 mg total) by mouth 2 (two) times daily.   Dulaglutide 1.5 MG/0.5ML SOPN Inject into the skin. Inject 0.5 mLs (1.5 mg total) subcutaneously every 7 (seven) days   gabapentin (NEURONTIN) 800 MG tablet Take 800 mg by mouth 2 (two) times daily as needed (pain). blackwood   INVOKAMET XR 150-500 MG TB24 Take 1 tablet by mouth in the morning and at bedtime. 2 at breakfast 1 at night   lansoprazole (PREVACID) 30 MG capsule Take 1 capsule daily   levothyroxine (SYNTHROID) 75 MCG tablet Take 75 mcg by mouth daily before breakfast. Takes 75 mcg tues-Sunday and takes 112.5 mcf (1.5 tabs) on Monday   losartan (COZAAR) 25 MG tablet Take 1  tablet (25 mg total) by mouth daily.   meclizine (ANTIVERT) 25 MG tablet Take 1 tablet (25 mg total) by mouth 3 (three) times daily as needed for dizziness.   Multiple Vitamins-Minerals (ONE-A-DAY VITACRAVES ADULT) CHEW Chew 1 tablet by mouth daily. Patient take gummy   sertraline (ZOLOFT) 50 MG tablet Take 1 tablet (50 mg total) by mouth daily.       03/24/2022    2:44 PM 11/03/2021    8:56 AM 05/05/2021   10:23 AM 11/15/2020   11:00 AM  GAD 7 : Generalized Anxiety Score  Nervous, Anxious, on Edge 0 0 0 1  Control/stop worrying 0 1 0 0  Worry too much - different things 0 1 0 0  Trouble relaxing 0 2 0 0  Restless 0 0 0 0  Easily annoyed or irritable 0 1 0 2  Afraid - awful might happen 0 0 0 2  Total GAD 7 Score 0 5 0 5  Anxiety Difficulty Not difficult at all   Not difficult at all       03/24/2022    2:43 PM 11/03/2021    8:55 AM 08/13/2021    2:22 PM  Depression screen PHQ 2/9  Decreased Interest 0 1 0  Down, Depressed, Hopeless 0 1 0  PHQ - 2 Score 0 2 0  Altered sleeping 0 2   Tired, decreased energy 0 2   Change in appetite 0 2   Feeling bad or failure about yourself  0 0   Trouble concentrating 0 0   Moving slowly or fidgety/restless 0 0   Suicidal thoughts 0 0   PHQ-9 Score 0 8   Difficult doing work/chores Not difficult at all      BP Readings from Last 3 Encounters:  03/24/22 132/86  03/21/22 (!) 169/89  11/10/21 132/82    Physical Exam HENT:     Right Ear: Tympanic membrane normal.     Left Ear: Tympanic membrane normal.  Cardiovascular:     Heart sounds: No murmur heard.    No gallop.  Pulmonary:     Breath sounds: No wheezing or rhonchi.  Abdominal:     Tenderness: There is no abdominal tenderness. There is no guarding or rebound.  Musculoskeletal:     Cervical back: No rigidity.  Skin:    Findings: No erythema.  Neurological:     Mental Status: She is alert.     Wt Readings from Last 3 Encounters:  03/24/22 165 lb (74.8 kg)  03/21/22 174  lb (78.9 kg)  11/10/21 176 lb (79.8 kg)    BP 132/86   Pulse 80   Ht 5' (1.524 m)   Wt 165  lb (74.8 kg)   BMI 32.22 kg/m   Assessment and Plan:  1. Cellulitis of left upper extremity New onset.  Resolving.  Stable.  Resolution of erythema with some residual tenderness over the tendons.  Patient will continue to completion doxycycline 100 mg twice a day.  I have also suggested using Tylenol for pain relief only and if necessary due to tenderness patient may take Advil on an as-needed basis for the remainder of the week.   Otilio Miu, MD

## 2022-03-29 ENCOUNTER — Other Ambulatory Visit: Payer: Self-pay | Admitting: Family Medicine

## 2022-03-29 DIAGNOSIS — E7801 Familial hypercholesterolemia: Secondary | ICD-10-CM

## 2022-03-29 DIAGNOSIS — F324 Major depressive disorder, single episode, in partial remission: Secondary | ICD-10-CM

## 2022-03-29 DIAGNOSIS — K219 Gastro-esophageal reflux disease without esophagitis: Secondary | ICD-10-CM

## 2022-03-29 DIAGNOSIS — I10 Essential (primary) hypertension: Secondary | ICD-10-CM

## 2022-03-29 DIAGNOSIS — R5383 Other fatigue: Secondary | ICD-10-CM

## 2022-04-10 ENCOUNTER — Ambulatory Visit: Payer: Self-pay

## 2022-04-10 DIAGNOSIS — E1165 Type 2 diabetes mellitus with hyperglycemia: Secondary | ICD-10-CM | POA: Diagnosis not present

## 2022-04-10 DIAGNOSIS — R29818 Other symptoms and signs involving the nervous system: Secondary | ICD-10-CM | POA: Diagnosis not present

## 2022-04-10 DIAGNOSIS — E039 Hypothyroidism, unspecified: Secondary | ICD-10-CM | POA: Diagnosis not present

## 2022-04-10 DIAGNOSIS — R2990 Unspecified symptoms and signs involving the nervous system: Secondary | ICD-10-CM | POA: Diagnosis not present

## 2022-04-10 DIAGNOSIS — R4182 Altered mental status, unspecified: Secondary | ICD-10-CM | POA: Diagnosis not present

## 2022-04-10 DIAGNOSIS — J9811 Atelectasis: Secondary | ICD-10-CM | POA: Diagnosis not present

## 2022-04-10 DIAGNOSIS — E8809 Other disorders of plasma-protein metabolism, not elsewhere classified: Secondary | ICD-10-CM | POA: Diagnosis not present

## 2022-04-10 DIAGNOSIS — R42 Dizziness and giddiness: Secondary | ICD-10-CM | POA: Diagnosis not present

## 2022-04-10 DIAGNOSIS — I663 Occlusion and stenosis of cerebellar arteries: Secondary | ICD-10-CM | POA: Diagnosis not present

## 2022-04-10 DIAGNOSIS — R531 Weakness: Secondary | ICD-10-CM | POA: Diagnosis not present

## 2022-04-10 DIAGNOSIS — Z20822 Contact with and (suspected) exposure to covid-19: Secondary | ICD-10-CM | POA: Diagnosis not present

## 2022-04-10 DIAGNOSIS — I6501 Occlusion and stenosis of right vertebral artery: Secondary | ICD-10-CM | POA: Diagnosis not present

## 2022-04-10 DIAGNOSIS — R4781 Slurred speech: Secondary | ICD-10-CM | POA: Diagnosis not present

## 2022-04-10 NOTE — Telephone Encounter (Signed)
    Chief Complaint: Sudden onset of numbness to both hands, cannot move her hands and has dizziness. Symptoms: Above Frequency: 30 minutes ago Pertinent Negatives: Patient denies chest pain, SOB Disposition: [x] ED /[] Urgent Care (no appt availability in office) / [] Appointment(In office/virtual)/ []  [facility]/ [] Home Care/ [] Refused Recommended Disposition /[] [facility]/ []  Follow-up with PCP Additional Notes: Husband will call 911.  Reason for Disposition  Sounds like a life-threatening emergency to the triager  Answer Assessment - Initial Assessment Questions 1. SYMPTOM: "What is the main symptom you are concerned about?" (e.g., weakness, numbness)     Cannot feel her hands 2. ONSET: "When did this start?" (minutes, hours, days; while sleeping)     30 minutes ago 3. LAST NORMAL: "When was the last time you (the patient) were normal (no symptoms)?"     Today 4. PATTERN "Does this come and go, or has it been constant since it started?"  "Is it present now?"     Constant 5. CARDIAC SYMPTOMS: "Have you had any of the following symptoms: chest pain, difficulty breathing, palpitations?"     No 6. NEUROLOGIC SYMPTOMS: "Have you had any of the following symptoms: headache, dizziness, vision loss, double vision, changes in speech, unsteady on your feet?"     Dizzy 7. OTHER SYMPTOMS: "Do you have any other symptoms?"     No 8. PREGNANCY: "Is there any chance you are pregnant?" "When was your last menstrual period?"     No  Protocols used: Neurologic Deficit-A-AH

## 2022-04-11 DIAGNOSIS — E119 Type 2 diabetes mellitus without complications: Secondary | ICD-10-CM | POA: Diagnosis not present

## 2022-04-11 DIAGNOSIS — I10 Essential (primary) hypertension: Secondary | ICD-10-CM | POA: Diagnosis not present

## 2022-04-11 DIAGNOSIS — I6501 Occlusion and stenosis of right vertebral artery: Secondary | ICD-10-CM | POA: Diagnosis not present

## 2022-04-11 DIAGNOSIS — R4182 Altered mental status, unspecified: Secondary | ICD-10-CM | POA: Diagnosis not present

## 2022-04-11 DIAGNOSIS — R2990 Unspecified symptoms and signs involving the nervous system: Secondary | ICD-10-CM | POA: Diagnosis not present

## 2022-04-11 DIAGNOSIS — E785 Hyperlipidemia, unspecified: Secondary | ICD-10-CM | POA: Diagnosis not present

## 2022-04-11 DIAGNOSIS — E8809 Other disorders of plasma-protein metabolism, not elsewhere classified: Secondary | ICD-10-CM | POA: Diagnosis not present

## 2022-04-11 DIAGNOSIS — E039 Hypothyroidism, unspecified: Secondary | ICD-10-CM | POA: Diagnosis not present

## 2022-04-12 DIAGNOSIS — E039 Hypothyroidism, unspecified: Secondary | ICD-10-CM | POA: Diagnosis not present

## 2022-04-12 DIAGNOSIS — R531 Weakness: Secondary | ICD-10-CM | POA: Diagnosis not present

## 2022-04-16 ENCOUNTER — Encounter: Payer: Self-pay | Admitting: Family Medicine

## 2022-04-16 ENCOUNTER — Ambulatory Visit (INDEPENDENT_AMBULATORY_CARE_PROVIDER_SITE_OTHER): Payer: Medicare PPO | Admitting: Family Medicine

## 2022-04-16 VITALS — BP 130/70 | HR 80 | Ht 60.0 in | Wt 169.0 lb

## 2022-04-16 DIAGNOSIS — Z1211 Encounter for screening for malignant neoplasm of colon: Secondary | ICD-10-CM

## 2022-04-16 DIAGNOSIS — E039 Hypothyroidism, unspecified: Secondary | ICD-10-CM

## 2022-04-16 DIAGNOSIS — Z8639 Personal history of other endocrine, nutritional and metabolic disease: Secondary | ICD-10-CM | POA: Diagnosis not present

## 2022-04-16 DIAGNOSIS — E860 Dehydration: Secondary | ICD-10-CM | POA: Diagnosis not present

## 2022-04-16 MED ORDER — LEVOTHYROXINE SODIUM 88 MCG PO TABS: 88.0000 ug | ORAL_TABLET | Freq: Every day | ORAL | 0 refills | Status: DC

## 2022-04-16 NOTE — Progress Notes (Signed)
Date:  04/16/2022   Name:  Sara Carr   DOB:  11/15/1945   MRN:  [id number]   Chief Complaint: Hypothyroidism ([place]  Thyroid Problem Presents for follow-up visit. Symptoms include constipation, dry skin, fatigue and hair loss. Patient reports no anxiety, cold intolerance, depressed mood, diaphoresis, diarrhea, heat intolerance, hoarse voice, leg swelling, menstrual problem, nail problem, palpitations, tremors, visual change, weight gain or weight loss. The symptoms have been stable.    Lab Results  Component Value Date   NA 135 11/10/2021   K 4.0 11/10/2021   CO2 24 11/10/2021   GLUCOSE 151 (H) 11/10/2021   BUN 23 11/10/2021   CREATININE 1.06 (H) 11/10/2021   CALCIUM 8.8 (L) 11/10/2021   EGFR 50 (L) 11/15/2020   GFRNONAA 54 (L) 11/10/2021   Lab Results  Component Value Date   CHOL 147 05/05/2021   HDL 39 (L) 05/05/2021   LDLCALC 83 05/05/2021   TRIG 140 05/05/2021   No results found for: "TSH" Lab Results  Component Value Date   HGBA1C 8.5 (H) 01/22/2020   Lab Results  Component Value Date   WBC 9.2 11/15/2020   HGB 14.1 11/15/2020   HCT 45.4 11/15/2020   MCV 88 11/15/2020   PLT 288 11/15/2020   Lab Results  Component Value Date   ALT 16 05/20/2018   AST 16 05/20/2018   No results found for: "25OHVITD2", "25OHVITD3", "VD25OH"   Review of Systems  Constitutional:  Positive for fatigue. Negative for chills, diaphoresis, fever, weight gain and weight loss.  HENT:  Negative for drooling, ear discharge, ear pain, hoarse voice and sore throat.   Respiratory:  Negative for cough, shortness of breath and wheezing.   Cardiovascular:  Negative for chest pain, palpitations and leg swelling.  Gastrointestinal:  Positive for constipation. Negative for abdominal pain, blood in stool, diarrhea and nausea.  Endocrine: Negative for cold intolerance, heat intolerance and polydipsia.  Genitourinary:  Negative for dysuria,  frequency, hematuria, menstrual problem and urgency.  Musculoskeletal:  Negative for back pain, myalgias and neck pain.  Skin:  Negative for rash.  Allergic/Immunologic: Negative for environmental allergies.  Neurological:  Negative for dizziness, tremors and headaches.  Hematological:  Does not bruise/bleed easily.  Psychiatric/Behavioral:  Negative for suicidal ideas. The patient is not nervous/anxious.     Patient Active Problem List   Diagnosis Date Noted   Lumbar radiculopathy 01/22/2020    Allergies  Allergen Reactions   Penicillins Hives and Swelling    Past Surgical History:  Procedure Laterality Date   BACK SURGERY     CHOLECYSTECTOMY  1979   JOINT REPLACEMENT Bilateral 2017   LUMBAR LAMINECTOMY/ DECOMPRESSION WITH MET-RX N/A 01/22/2020   Procedure: L3-4 laminectomy;  Surgeon: CDeetta Perla MD;  Location: [facility] ORS;  Service: Neurosurgery;  Laterality: N/A;   REPLACEMENT TOTAL KNEE Bilateral    SPINE SURGERY  2004    Social History   Tobacco Use   Smoking status: Never   Smokeless tobacco: Never   Tobacco comments:    none  Vaping Use   Vaping Use: Never used  Substance Use Topics   Alcohol use: Never   Drug use: Never     Medication list has been reviewed and updated.  Current Meds  Medication Sig   acetaminophen (TYLENOL) 325 MG tablet Take 2 tablets (650 mg total) by mouth every 4 (four) hours as needed for mild pain ((score 1 to 3) or  temp > 100.5).   atorvastatin (LIPITOR) 40 MG tablet TAKE 1 TABLET EVERY DAY   calcium carbonate (TUMS - DOSED IN MG ELEMENTAL CALCIUM) 500 MG chewable tablet Chew 1 tablet by mouth daily as needed for indigestion or heartburn.   carvedilol (COREG) 6.25 MG tablet TAKE 1 TABLET EVERY DAY   Continuous Blood Gluc Sensor (FREESTYLE LIBRE 14 DAY SENSOR) MISC    docusate sodium (COLACE) 50 MG capsule Take 1 capsule (50 mg total) by mouth daily.   Dulaglutide 1.5 MG/0.5ML SOPN Inject into the skin. Inject 0.5 mLs (1.5 mg total)  subcutaneously every 7 (seven) days   gabapentin (NEURONTIN) 800 MG tablet Take 800 mg by mouth 2 (two) times daily as needed (pain). blackwood   INVOKAMET XR 150-500 MG TB24 Take 1 tablet by mouth in the morning and at bedtime. 2 at breakfast 1 at night   lansoprazole (PREVACID) 30 MG capsule TAKE 1 CAPSULE EVERY DAY   losartan (COZAAR) 25 MG tablet TAKE 1 TABLET EVERY DAY   meclizine (ANTIVERT) 25 MG tablet Take 1 tablet (25 mg total) by mouth 3 (three) times daily as needed for dizziness.   Multiple Vitamins-Minerals (ONE-A-DAY VITACRAVES ADULT) CHEW Chew 1 tablet by mouth daily. Patient take gummy   sertraline (ZOLOFT) 50 MG tablet TAKE 1 TABLET EVERY DAY   [DISCONTINUED] levothyroxine (SYNTHROID) 88 MCG tablet Take 88 mcg by mouth daily before breakfast. Once a day       04/16/2022    2:13 PM 03/24/2022    2:44 PM 11/03/2021    8:56 AM 05/05/2021   10:23 AM  GAD 7 : Generalized Anxiety Score  Nervous, Anxious, on Edge 0 0 0 0  Control/stop worrying 0 0 1 0  Worry too much - different things 0 0 1 0  Trouble relaxing 0 0 2 0  Restless 0 0 0 0  Easily annoyed or irritable 0 0 1 0  Afraid - awful might happen 0 0 0 0  Total GAD 7 Score 0 0 5 0  Anxiety Difficulty Not difficult at all Not difficult at all         04/16/2022    2:13 PM 03/24/2022    2:43 PM 11/03/2021    8:55 AM  Depression screen PHQ 2/9  Decreased Interest 0 0 1  Down, Depressed, Hopeless 0 0 1  PHQ - 2 Score 0 0 2  Altered sleeping 0 0 2  Tired, decreased energy 0 0 2  Change in appetite 0 0 2  Feeling bad or failure about yourself  0 0 0  Trouble concentrating 0 0 0  Moving slowly or fidgety/restless 0 0 0  Suicidal thoughts 0 0 0  PHQ-9 Score 0 0 8  Difficult doing work/chores Not difficult at all Not difficult at all     BP Readings from Last 3 Encounters:  04/16/22 130/70  03/24/22 132/86  03/21/22 (!) 169/89    Physical Exam Vitals and nursing note reviewed. Exam conducted with a chaperone  present.  Constitutional:      General: She is not in acute distress.    Appearance: She is not diaphoretic.  HENT:     Head: Normocephalic and atraumatic.     Right Ear: External ear normal.     Left Ear: External ear normal.     Nose: Nose normal.  Eyes:     General:        Right eye: No discharge.  Left eye: No discharge.     Conjunctiva/sclera: Conjunctivae normal.     Pupils: Pupils are equal, round, and reactive to light.  Neck:     Thyroid: No thyromegaly.     Vascular: No JVD.  Cardiovascular:     Rate and Rhythm: Normal rate and regular rhythm.     Heart sounds: Normal heart sounds. No murmur heard.    No friction rub. No gallop.  Pulmonary:     Effort: Pulmonary effort is normal.     Breath sounds: Normal breath sounds.  Abdominal:     General: Bowel sounds are normal.     Palpations: Abdomen is soft. There is no mass.     Tenderness: There is no abdominal tenderness. There is no guarding.  Musculoskeletal:        General: Normal range of motion.     Cervical back: Normal range of motion and neck supple.  Lymphadenopathy:     Cervical: No cervical adenopathy.  Skin:    General: Skin is warm and dry.  Neurological:     Mental Status: She is alert.     Deep Tendon Reflexes: Reflexes are normal and symmetric.     Wt Readings from Last 3 Encounters:  04/16/22 169 lb (76.7 kg)  03/24/22 165 lb (74.8 kg)  03/21/22 174 lb (78.9 kg)    BP 130/70   Pulse 80   Ht 5' (1.524 m)   Wt 169 lb (76.7 kg)   BMI 33.01 kg/m   Assessment and Plan:  1. Colon cancer screening Discussed and referral.  Placed for FIT testing. - Fecal occult blood, imunochemical([facility])  2. Hypothyroidism, unspecified type Chronic.  Uncontrolled.  Previously not stable patient is back to baseline.  We will check levothyroxine supplementation of 88 mcg daily with thyroid panel and TSH. - levothyroxine (SYNTHROID) 88 MCG tablet; Take 1 tablet (88 mcg total) by mouth daily  before breakfast. Once a day  Dispense: 90 tablet; Refill: 0 - Thyroid Panel With TSH    Otilio Miu, MD

## 2022-04-17 LAB — RENAL FUNCTION PANEL
Albumin: 4.3 g/dL (ref 3.8–4.8)
BUN/Creatinine Ratio: 17 (ref 12–28)
BUN: 15 mg/dL (ref 8–27)
CO2: 23 mmol/L (ref 20–29)
Calcium: 9.2 mg/dL (ref 8.7–10.3)
Chloride: 103 mmol/L (ref 96–106)
Creatinine, Ser: 0.87 mg/dL (ref 0.57–1.00)
Glucose: 148 mg/dL — ABNORMAL HIGH (ref 70–99)
Phosphorus: 2.9 mg/dL — ABNORMAL LOW (ref 3.0–4.3)
Potassium: 4.6 mmol/L (ref 3.5–5.2)
Sodium: 143 mmol/L (ref 134–144)
eGFR: 69 mL/min/{1.73_m2} (ref >59)

## 2022-04-22 DIAGNOSIS — Z1211 Encounter for screening for malignant neoplasm of colon: Secondary | ICD-10-CM | POA: Diagnosis not present

## 2022-04-24 LAB — FECAL OCCULT BLOOD, IMMUNOCHEMICAL: Fecal Occult Bld: NEGATIVE

## 2022-05-04 DIAGNOSIS — D649 Anemia, unspecified: Secondary | ICD-10-CM | POA: Diagnosis not present

## 2022-05-04 DIAGNOSIS — R4182 Altered mental status, unspecified: Secondary | ICD-10-CM | POA: Diagnosis not present

## 2022-05-04 DIAGNOSIS — E119 Type 2 diabetes mellitus without complications: Secondary | ICD-10-CM | POA: Diagnosis not present

## 2022-05-04 DIAGNOSIS — N3281 Overactive bladder: Secondary | ICD-10-CM | POA: Diagnosis not present

## 2022-05-04 DIAGNOSIS — E785 Hyperlipidemia, unspecified: Secondary | ICD-10-CM | POA: Diagnosis not present

## 2022-05-04 DIAGNOSIS — K449 Diaphragmatic hernia without obstruction or gangrene: Secondary | ICD-10-CM | POA: Diagnosis not present

## 2022-05-04 DIAGNOSIS — R42 Dizziness and giddiness: Secondary | ICD-10-CM | POA: Diagnosis not present

## 2022-05-04 DIAGNOSIS — I6503 Occlusion and stenosis of bilateral vertebral arteries: Secondary | ICD-10-CM | POA: Diagnosis not present

## 2022-05-04 DIAGNOSIS — R29818 Other symptoms and signs involving the nervous system: Secondary | ICD-10-CM | POA: Diagnosis not present

## 2022-05-04 DIAGNOSIS — F39 Unspecified mood [affective] disorder: Secondary | ICD-10-CM | POA: Diagnosis not present

## 2022-05-04 DIAGNOSIS — R55 Syncope and collapse: Secondary | ICD-10-CM | POA: Diagnosis not present

## 2022-05-04 DIAGNOSIS — R531 Weakness: Secondary | ICD-10-CM | POA: Diagnosis not present

## 2022-05-04 DIAGNOSIS — K5641 Fecal impaction: Secondary | ICD-10-CM | POA: Diagnosis not present

## 2022-05-04 DIAGNOSIS — E039 Hypothyroidism, unspecified: Secondary | ICD-10-CM | POA: Diagnosis not present

## 2022-05-04 DIAGNOSIS — R000 Tachycardia, unspecified: Secondary | ICD-10-CM | POA: Diagnosis not present

## 2022-05-04 DIAGNOSIS — I663 Occlusion and stenosis of cerebellar arteries: Secondary | ICD-10-CM | POA: Diagnosis not present

## 2022-05-04 DIAGNOSIS — I10 Essential (primary) hypertension: Secondary | ICD-10-CM | POA: Diagnosis not present

## 2022-05-04 DIAGNOSIS — I119 Hypertensive heart disease without heart failure: Secondary | ICD-10-CM | POA: Diagnosis not present

## 2022-05-04 DIAGNOSIS — G9341 Metabolic encephalopathy: Secondary | ICD-10-CM | POA: Diagnosis not present

## 2022-05-04 DIAGNOSIS — R2990 Unspecified symptoms and signs involving the nervous system: Secondary | ICD-10-CM | POA: Diagnosis not present

## 2022-05-04 DIAGNOSIS — J9809 Other diseases of bronchus, not elsewhere classified: Secondary | ICD-10-CM | POA: Diagnosis not present

## 2022-05-04 LAB — CBC: RBC: 3.75 — AB (ref 3.87–5.11)

## 2022-05-04 LAB — HEPATIC FUNCTION PANEL
ALT: 10 U/L (ref 7–35)
AST: 27 (ref 13–35)
Alkaline Phosphatase: 57 (ref 25–125)

## 2022-05-04 LAB — CBC AND DIFFERENTIAL
HCT: 33 — AB (ref 36–46)
Hemoglobin: 10.5 — AB (ref 12.0–16.0)
Platelets: 231 10*3/uL (ref 150–400)
WBC: 5.9

## 2022-05-04 LAB — BASIC METABOLIC PANEL
BUN: 10 (ref 4–21)
CO2: 23 — AB (ref 13–22)
Chloride: 105 (ref 99–108)
Creatinine: 0.8 (ref 0.5–1.1)
Glucose: 183
Sodium: 137 (ref 137–147)

## 2022-05-04 LAB — COMPREHENSIVE METABOLIC PANEL
Albumin: 2.7 — AB (ref 3.5–5.0)
Calcium: 8.2 — AB (ref 8.7–10.7)
eGFR: 76

## 2022-05-05 DIAGNOSIS — R2990 Unspecified symptoms and signs involving the nervous system: Secondary | ICD-10-CM | POA: Diagnosis not present

## 2022-05-07 ENCOUNTER — Ambulatory Visit (INDEPENDENT_AMBULATORY_CARE_PROVIDER_SITE_OTHER): Payer: Medicare PPO | Admitting: Family Medicine

## 2022-05-07 ENCOUNTER — Encounter: Payer: Self-pay | Admitting: Family Medicine

## 2022-05-07 VITALS — BP 120/88 | HR 80 | Ht 60.0 in | Wt 167.0 lb

## 2022-05-07 DIAGNOSIS — E039 Hypothyroidism, unspecified: Secondary | ICD-10-CM | POA: Diagnosis not present

## 2022-05-07 DIAGNOSIS — I10 Essential (primary) hypertension: Secondary | ICD-10-CM | POA: Diagnosis not present

## 2022-05-07 DIAGNOSIS — R399 Unspecified symptoms and signs involving the genitourinary system: Secondary | ICD-10-CM | POA: Diagnosis not present

## 2022-05-07 DIAGNOSIS — F324 Major depressive disorder, single episode, in partial remission: Secondary | ICD-10-CM | POA: Diagnosis not present

## 2022-05-07 DIAGNOSIS — R5383 Other fatigue: Secondary | ICD-10-CM

## 2022-05-07 DIAGNOSIS — N3090 Cystitis, unspecified without hematuria: Secondary | ICD-10-CM | POA: Diagnosis not present

## 2022-05-07 DIAGNOSIS — Z23 Encounter for immunization: Secondary | ICD-10-CM | POA: Diagnosis not present

## 2022-05-07 DIAGNOSIS — E7801 Familial hypercholesterolemia: Secondary | ICD-10-CM

## 2022-05-07 DIAGNOSIS — K219 Gastro-esophageal reflux disease without esophagitis: Secondary | ICD-10-CM

## 2022-05-07 LAB — POCT URINALYSIS DIPSTICK
Bilirubin, UA: NEGATIVE
Glucose, UA: POSITIVE — AB
Ketones, UA: NEGATIVE
Nitrite, UA: POSITIVE
Protein, UA: NEGATIVE
Spec Grav, UA: 1.01 (ref 1.010–1.025)
Urobilinogen, UA: 0.2 E.U./dL
pH, UA: 6 (ref 5.0–8.0)

## 2022-05-07 MED ORDER — CARVEDILOL 6.25 MG PO TABS: 6.2500 mg | ORAL_TABLET | Freq: Every day | ORAL | 0 refills | Status: DC

## 2022-05-07 MED ORDER — NITROFURANTOIN MONOHYD MACRO 100 MG PO CAPS: 100.0000 mg | ORAL_CAPSULE | Freq: Two times a day (BID) | ORAL | 0 refills | Status: AC

## 2022-05-07 MED ORDER — LANSOPRAZOLE 30 MG PO CPDR: DELAYED_RELEASE_CAPSULE | ORAL | 0 refills | Status: DC

## 2022-05-07 MED ORDER — LOSARTAN POTASSIUM 25 MG PO TABS: 25.0000 mg | ORAL_TABLET | Freq: Every day | ORAL | 0 refills | Status: DC

## 2022-05-07 MED ORDER — SERTRALINE HCL 50 MG PO TABS: 50.0000 mg | ORAL_TABLET | Freq: Every day | ORAL | 0 refills | Status: DC

## 2022-05-07 MED ORDER — LEVOTHYROXINE SODIUM 88 MCG PO TABS: 88.0000 ug | ORAL_TABLET | Freq: Every day | ORAL | 0 refills | Status: DC

## 2022-05-07 MED ORDER — ATORVASTATIN CALCIUM 40 MG PO TABS: 40.0000 mg | ORAL_TABLET | Freq: Every day | ORAL | 0 refills | Status: DC

## 2022-05-07 NOTE — Progress Notes (Signed)
cystitis   Date:  05/07/2022   Name:  Sara Carr   DOB:  1945-08-27   MRN:  [id number]   Chief Complaint: Depression, Hypertension, Gastroesophageal Reflux, Hyperlipidemia, Hypothyroidism, and Flu Vaccine  Depression        This is a chronic problem.  The current episode started more than 1 month ago.   The onset quality is gradual.   The problem occurs intermittently.  The problem has been gradually improving since onset.  Associated symptoms include no decreased concentration, no fatigue, no helplessness, no hopelessness, does not have insomnia, not irritable, no restlessness, no decreased interest, no appetite change, no body aches, no myalgias, no headaches, no indigestion, not sad and no suicidal ideas.     The symptoms are aggravated by nothing.  Past treatments include SSRIs - Selective serotonin reuptake inhibitors.  Compliance with treatment is good.   Pertinent negatives include no anxiety. Hypertension This is a chronic problem. The current episode started more than 1 year ago. The problem has been gradually improving since onset. The problem is controlled. Pertinent negatives include no anxiety, blurred vision, chest pain, headaches, malaise/fatigue, neck pain, orthopnea, palpitations, peripheral edema, PND, shortness of breath or sweats. There are no associated agents to hypertension. Past treatments include beta blockers, alpha 1 blockers and angiotensin blockers. The current treatment provides moderate improvement. There are no compliance problems.  ?tia.  Gastroesophageal Reflux She reports no abdominal pain, no belching, no chest pain, no coughing, no dysphagia, no early satiety, no globus sensation, no hoarse voice, no nausea, no sore throat, no stridor or no wheezing. The problem has been gradually improving. The symptoms are aggravated by certain foods. Pertinent negatives include no fatigue. There are no known risk factors. She has tried a PPI for the symptoms. The treatment  provided moderate relief.  Hyperlipidemia This is a chronic problem. The current episode started more than 1 year ago. The problem is controlled. Recent lipid tests were reviewed and are normal. Pertinent negatives include no chest pain, focal sensory loss, focal weakness, leg pain, myalgias or shortness of breath. Current antihyperlipidemic treatment includes statins. The current treatment provides mild improvement of lipids. There are no compliance problems.  Risk factors for coronary artery disease include dyslipidemia and diabetes mellitus.  Urinary Frequency  This is a new problem. The current episode started in the past 7 days. The problem occurs every urination. The problem has been gradually improving. The quality of the pain is described as burning. The pain is mild. There has been no fever. Associated symptoms include frequency and urgency. Pertinent negatives include no flank pain, hematuria, nausea or sweats.    Lab Results  Component Value Date   NA 143 04/16/2022   K 4.6 04/16/2022   CO2 23 04/16/2022   GLUCOSE 148 (H) 04/16/2022   BUN 15 04/16/2022   CREATININE 0.87 04/16/2022   CALCIUM 9.2 04/16/2022   EGFR 69 04/16/2022   GFRNONAA 54 (L) 11/10/2021   Lab Results  Component Value Date   CHOL 147 05/05/2021   HDL 39 (L) 05/05/2021   LDLCALC 83 05/05/2021   TRIG 140 05/05/2021   No results found for: "TSH" Lab Results  Component Value Date   HGBA1C 8.5 (H) 01/22/2020   Lab Results  Component Value Date   WBC 9.2 11/15/2020   HGB 14.1 11/15/2020   HCT 45.4 11/15/2020   MCV 88 11/15/2020   PLT 288 11/15/2020   Lab Results  Component Value Date   ALT 16 05/20/2018  AST 16 05/20/2018   No results found for: "25OHVITD2", "25OHVITD3", "VD25OH"   Review of Systems  Constitutional:  Negative for appetite change, fatigue and malaise/fatigue.  HENT:  Negative for hoarse voice and sore throat.   Eyes:  Negative for blurred vision.  Respiratory:  Negative for  cough, shortness of breath and wheezing.   Cardiovascular:  Negative for chest pain, palpitations, orthopnea and PND.  Gastrointestinal:  Negative for abdominal pain, dysphagia and nausea.  Genitourinary:  Positive for frequency and urgency. Negative for flank pain and hematuria.  Musculoskeletal:  Negative for myalgias and neck pain.  Neurological:  Negative for focal weakness and headaches.  Psychiatric/Behavioral:  Positive for depression. Negative for decreased concentration and suicidal ideas. The patient does not have insomnia.     Patient Active Problem List   Diagnosis Date Noted   Lumbar radiculopathy 01/22/2020    Allergies  Allergen Reactions   Penicillins Hives and Swelling    Past Surgical History:  Procedure Laterality Date   BACK SURGERY     CHOLECYSTECTOMY  1979   JOINT REPLACEMENT Bilateral 2017   LUMBAR LAMINECTOMY/ DECOMPRESSION WITH MET-RX N/A 01/22/2020   Procedure: L3-4 laminectomy;  Surgeon: [name], MD;  Location: [facility] OR[facility];  Service: Neurosurgery;  Laterality: N/A;   REPLACEMENT TOTAL KNEE Bilateral    SPINE SURGERY  2004    Social History   Tobacco Use   Smoking status: Never   Smokeless tobacco: Never   Tobacco comments:    none  Vaping Use   Vaping Use: Never used  Substance Use Topics   Alcohol use: Never   Drug use: Never     Medication list has been reviewed and updated.  Current Meds  Medication Sig   acetaminophen (TYLENOL) 325 MG tablet Take 2 tablets (650 mg total) by mouth every 4 (four) hours as needed for mild pain ((score 1 to 3) or temp > 100.5).   atorvastatin (LIPITOR) 40 MG tablet TAKE 1 TABLET EVERY DAY   calcium carbonate (TUMS - DOSED IN MG ELEMENTAL CALCIUM) 500 MG chewable tablet Chew 1 tablet by mouth daily as needed for indigestion or heartburn.   carvedilol (COREG) 6.25 MG tablet TAKE 1 TABLET EVERY DAY   Continuous Blood Gluc Sensor (FREESTYLE LIBRE 14 DAY SENSOR) MISC    docusate sodium (COLACE) 50 MG capsule  Take 1 capsule (50 mg total) by mouth daily.   Dulaglutide 1.5 MG/0.5ML SOPN Inject into the skin. Inject 0.5 mLs (1.5 mg total) subcutaneously every 7 (seven) days   gabapentin (NEURONTIN) 800 MG tablet Take 800 mg by mouth 2 (two) times daily as needed (pain). blackwood   INVOKAMET XR 150-500 MG TB24 Take 1 tablet by mouth in the morning and at bedtime. 2 at breakfast 1 at night   lansoprazole (PREVACID) 30 MG capsule TAKE 1 CAPSULE EVERY DAY   levothyroxine (SYNTHROID) 88 MCG tablet Take 1 tablet (88 mcg total) by mouth daily before breakfast. Once a day   losartan (COZAAR) 25 MG tablet TAKE 1 TABLET EVERY DAY   meclizine (ANTIVERT) 25 MG tablet Take 1 tablet (25 mg total) by mouth 3 (three) times daily as needed for dizziness.   Multiple Vitamins-Minerals (ONE-A-DAY VITACRAVES ADULT) CHEW Chew 1 tablet by mouth daily. Patient take gummy   ondansetron (ZOFRAN-ODT) 4 MG disintegrating tablet Take 1 tablet (4 mg total) by mouth every 6 (six) hours as needed for nausea or vomiting.   promethazine (PHENERGAN) 25 MG tablet Take 1 tablet (25 mg total)  by mouth every 8 (eight) hours as needed for nausea or vomiting.   sertraline (ZOLOFT) 50 MG tablet TAKE 1 TABLET EVERY DAY       05/07/2022   10:05 AM 04/16/2022    2:13 PM 03/24/2022    2:44 PM 11/03/2021    8:56 AM  GAD 7 : Generalized Anxiety Score  Nervous, Anxious, on Edge 0 0 0 0  Control/stop worrying 0 0 0 1  Worry too much - different things 0 0 0 1  Trouble relaxing 0 0 0 2  Restless 0 0 0 0  Easily annoyed or irritable 0 0 0 1  Afraid - awful might happen 0 0 0 0  Total GAD 7 Score 0 0 0 5  Anxiety Difficulty Not difficult at all Not difficult at all Not difficult at all        05/07/2022   10:05 AM 04/16/2022    2:13 PM 03/24/2022    2:43 PM  Depression screen PHQ 2/9  Decreased Interest 0 0 0  Down, Depressed, Hopeless 0 0 0  PHQ - 2 Score 0 0 0  Altered sleeping 0 0 0  Tired, decreased energy 0 0 0  Change in appetite 0 0 0   Feeling bad or failure about yourself  0 0 0  Trouble concentrating 0 0 0  Moving slowly or fidgety/restless 0 0 0  Suicidal thoughts 0 0 0  PHQ-9 Score 0 0 0  Difficult doing work/chores Not difficult at all Not difficult at all Not difficult at all    BP Readings from Last 3 Encounters:  05/07/22 120/88  04/16/22 130/70  03/24/22 132/86    Physical Exam Vitals and nursing note reviewed.  Constitutional:      General: She is not irritable.    Appearance: She is well-developed.  HENT:     Head: Normocephalic.     Right Ear: External ear normal.     Left Ear: External ear normal.     Nose: Nose normal.  Eyes:     General: Lids are everted, no foreign bodies appreciated. No scleral icterus.       Left eye: No foreign body or hordeolum.     Conjunctiva/sclera: Conjunctivae normal.     Right eye: Right conjunctiva is not injected.     Left eye: Left conjunctiva is not injected.     Pupils: Pupils are equal, round, and reactive to light.  Neck:     Thyroid: No thyromegaly.     Vascular: No JVD.     Trachea: No tracheal deviation.  Cardiovascular:     Rate and Rhythm: Normal rate and regular rhythm.     Chest Wall: PMI is not displaced.     Heart sounds: Normal heart sounds, S1 normal and S2 normal. No murmur heard.    No systolic murmur is present.     No diastolic murmur is present.     No friction rub. No gallop. No S3 or S4 sounds.  Pulmonary:     Effort: Pulmonary effort is normal. No respiratory distress.     Breath sounds: Normal breath sounds. No wheezing or rales.  Abdominal:     General: Bowel sounds are normal.     Palpations: Abdomen is soft. There is no mass.     Tenderness: There is abdominal tenderness in the suprapubic area. There is no guarding or rebound.  Musculoskeletal:        General: No tenderness. Normal range of motion.  Cervical back: Normal range of motion and neck supple.     Right lower leg: No edema.     Left lower leg: No edema.   Lymphadenopathy:     Cervical: No cervical adenopathy.  Skin:    General: Skin is warm.     Findings: No rash.  Neurological:     Mental Status: She is alert and oriented to person, place, and time.     Cranial Nerves: No cranial nerve deficit.     Deep Tendon Reflexes: Reflexes normal.  Psychiatric:        Mood and Affect: Mood is not anxious or depressed.     Wt Readings from Last 3 Encounters:  05/07/22 167 lb (75.8 kg)  04/16/22 169 lb (76.7 kg)  03/24/22 165 lb (74.8 kg)    BP 120/88   Pulse 80   Ht 5' (1.524 m)   Wt 167 lb (75.8 kg)   BMI 32.61 kg/m   Assessment and Plan: 1. Essential hypertension Chronic.  Controlled.  Stable.  Blood pressure 120/88.  Asymptomatic.  Continue carvedilol 6.25 twice a day and losartan 25 mg daily.  We will recheck blood pressure check and 6 months.  Review of previous electrolytes GFR is acceptable from discharge. - carvedilol (COREG) 6.25 MG tablet; Take 1 tablet (6.25 mg total) by mouth daily.  Dispense: 90 tablet; Refill: 0 - losartan (COZAAR) 25 MG tablet; Take 1 tablet (25 mg total) by mouth daily.  Dispense: 90 tablet; Refill: 0  2. Familial hypercholesterolemia Chronic.  Controlled.  Stable.  Continue atorvastatin 40 mg once a day.  Review of previous lipid panel is acceptable. - atorvastatin (LIPITOR) 40 MG tablet; Take 1 tablet (40 mg total) by mouth daily.  Dispense: 90 tablet; Refill: 0  3. Gastroesophageal reflux disease without esophagitis Chronic.  Controlled.  Continue continue Prevacid 30 mg nightly.  We will recheck in 6 months. - lansoprazole (PREVACID) 30 MG capsule; TAKE 1 CAPSULE EVERY DAY  Dispense: 90 capsule; Refill: 0  4. Hypothyroidism, unspecified type Chronic.  Controlled.  Stable.  Continue levothyroxine 88 mcg daily.  To be evaluated then endocrinology in the not too distant future. - levothyroxine (SYNTHROID) 88 MCG tablet; Take 1 tablet (88 mcg total) by mouth daily before breakfast. Once a day   Dispense: 90 tablet; Refill: 0  5. Fatigue, unspecified type Continue losartan 25 mg once a day.  For blood pressure control as noted above. - losartan (COZAAR) 25 MG tablet; Take 1 tablet (25 mg total) by mouth daily.  Dispense: 90 tablet; Refill: 0  6. Major depressive disorder in partial remission, unspecified whether recurrent (HCC) Chronic.  Controlled.  Stable.  PHQ is 0.  GAD score is 0.  Continue sertraline 50 mg once a day. - sertraline (ZOLOFT) 50 MG tablet; Take 1 tablet (50 mg total) by mouth daily.  Dispense: 90 tablet; Refill: 0  7. Cystitis New onset.  Patient has baseline urge incontinence which may be causing the frequency and urgency.  There is mild tenderness and on examination of the urine there was noted to be low numbers of leukocytes erythrocytes and nitrites.  We will initiate Macrobid 100 mg 1 capsule twice a day for 3 days. - nitrofurantoin, macrocrystal-monohydrate, (MACROBID) 100 MG capsule; Take 1 capsule (100 mg total) by mouth 2 (two) times daily for 3 days.  Dispense: 6 capsule; Refill: 0  8. Need for immunization against influenza Discussed and administered - Flu Vaccine QUAD High Dose(Fluad)  Otilio Miu, MD

## 2022-05-07 NOTE — Addendum Note (Signed)
Addended by: Zada Girt on: 05/07/2022 11:18 AM   Modules accepted: Orders

## 2022-05-08 ENCOUNTER — Other Ambulatory Visit: Payer: Self-pay

## 2022-05-08 DIAGNOSIS — G459 Transient cerebral ischemic attack, unspecified: Secondary | ICD-10-CM

## 2022-05-21 DIAGNOSIS — R2689 Other abnormalities of gait and mobility: Secondary | ICD-10-CM | POA: Diagnosis not present

## 2022-05-25 ENCOUNTER — Ambulatory Visit (INDEPENDENT_AMBULATORY_CARE_PROVIDER_SITE_OTHER): Payer: Medicare PPO | Admitting: Family Medicine

## 2022-05-25 ENCOUNTER — Encounter: Payer: Self-pay | Admitting: Family Medicine

## 2022-05-25 VITALS — BP 128/72 | HR 76 | Ht 60.0 in | Wt 169.0 lb

## 2022-05-25 DIAGNOSIS — E039 Hypothyroidism, unspecified: Secondary | ICD-10-CM | POA: Diagnosis not present

## 2022-05-25 NOTE — Progress Notes (Signed)
Date:  05/25/2022   Name:  Sara Carr   DOB:  [birth year]   MRN:  [id number]   Chief Complaint: magnesium and thyroid recheck  Thyroid Problem Presents for follow-up visit. Patient reports no anxiety, constipation, diarrhea, fatigue, palpitations, weight gain or weight loss. The symptoms have been stable.    Lab Results  Component Value Date   NA 137 05/04/2022   K 4.6 04/16/2022   CO2 23 (A) 05/04/2022   GLUCOSE 148 (H) 04/16/2022   BUN 10 05/04/2022   CREATININE 0.8 05/04/2022   CALCIUM 8.2 (A) 05/04/2022   EGFR 76 05/04/2022   GFRNONAA 54 (L) 11/10/2021   Lab Results  Component Value Date   CHOL 147 05/05/2021   HDL 39 (L) 05/05/2021   LDLCALC 83 05/05/2021   TRIG 140 05/05/2021   No results found for: "TSH" Lab Results  Component Value Date   HGBA1C 8.5 (H) 01/22/2020   Lab Results  Component Value Date   WBC 5.9 05/04/2022   HGB 10.5 (A) 05/04/2022   HCT 33 (A) 05/04/2022   MCV 88 11/15/2020   PLT 231 05/04/2022   Lab Results  Component Value Date   ALT 10 05/04/2022   AST 27 05/04/2022   ALKPHOS 57 05/04/2022   No results found for: "25OHVITD2", "25OHVITD3", "VD25OH"   Review of Systems  Constitutional: Negative.  Negative for chills, fatigue, fever, unexpected weight change, weight gain and weight loss.  HENT:  Negative for congestion, ear discharge, ear pain, rhinorrhea, sinus pressure, sneezing and sore throat.   Respiratory:  Negative for cough, shortness of breath, wheezing and stridor.   Cardiovascular:  Negative for palpitations.  Gastrointestinal:  Negative for abdominal pain, blood in stool, constipation, diarrhea and nausea.  Genitourinary:  Negative for dysuria, flank pain, frequency, hematuria, urgency and vaginal discharge.  Musculoskeletal:  Negative for arthralgias, back pain and myalgias.  Skin:  Negative for rash.  Neurological:  Negative for dizziness, weakness and headaches.  Hematological:  Negative for adenopathy. Does not  bruise/bleed easily.  Psychiatric/Behavioral:  Negative for dysphoric mood. The patient is not nervous/anxious.     Patient Active Problem List   Diagnosis Date Noted   Lumbar radiculopathy 01/22/2020    Allergies  Allergen Reactions   Penicillins Hives and Swelling    Past Surgical History:  Procedure Laterality Date   BACK SURGERY     CHOLECYSTECTOMY  1979   JOINT REPLACEMENT Bilateral 2017   LUMBAR LAMINECTOMY/ DECOMPRESSION WITH MET-RX N/A 01/22/2020   Procedure: L3-4 laminectomy;  Surgeon: [name], MD;  Location: [facility];  Service: Neurosurgery;  Laterality: N/A;   REPLACEMENT TOTAL KNEE Bilateral    SPINE SURGERY  2004    Social History   Tobacco Use   Smoking status: Never   Smokeless tobacco: Never   Tobacco comments:    none  Vaping Use   Vaping Use: Never used  Substance Use Topics   Alcohol use: Never   Drug use: Never     Medication list has been reviewed and updated.  Current Meds  Medication Sig   acetaminophen (TYLENOL) 325 MG tablet Take 2 tablets (650 mg total) by mouth every 4 (four) hours as needed for mild pain ((score 1 to 3) or temp > 100.5).   atorvastatin (LIPITOR) 40 MG tablet Take 1 tablet (40 mg total) by mouth daily.   calcium carbonate (TUMS - DOSED IN MG ELEMENTAL CALCIUM) 500 MG chewable tablet Chew 1 tablet by mouth daily as needed  for indigestion or heartburn.   carvedilol (COREG) 6.25 MG tablet Take 1 tablet (6.25 mg total) by mouth daily.   Continuous Blood Gluc Sensor (FREESTYLE LIBRE 14 DAY SENSOR) MISC    docusate sodium (COLACE) 50 MG capsule Take 1 capsule (50 mg total) by mouth daily.   Dulaglutide 1.5 MG/0.5ML SOPN Inject into the skin. Inject 0.5 mLs (1.5 mg total) subcutaneously every 7 (seven) days   gabapentin (NEURONTIN) 800 MG tablet Take 800 mg by mouth 2 (two) times daily as needed (pain). blackwood   INVOKAMET XR 150-500 MG TB24 Take 1 tablet by mouth in the morning and at bedtime. 2 at breakfast 1 at night    lansoprazole (PREVACID) 30 MG capsule TAKE 1 CAPSULE EVERY DAY   levothyroxine (SYNTHROID) 88 MCG tablet Take 1 tablet (88 mcg total) by mouth daily before breakfast. Once a day   losartan (COZAAR) 25 MG tablet Take 1 tablet (25 mg total) by mouth daily.   meclizine (ANTIVERT) 25 MG tablet Take 1 tablet (25 mg total) by mouth 3 (three) times daily as needed for dizziness.   Multiple Vitamins-Minerals (ONE-A-DAY VITACRAVES ADULT) CHEW Chew 1 tablet by mouth daily. Patient take gummy   ondansetron (ZOFRAN-ODT) 4 MG disintegrating tablet Take 1 tablet (4 mg total) by mouth every 6 (six) hours as needed for nausea or vomiting.   promethazine (PHENERGAN) 25 MG tablet Take 1 tablet (25 mg total) by mouth every 8 (eight) hours as needed for nausea or vomiting.   sertraline (ZOLOFT) 50 MG tablet Take 1 tablet (50 mg total) by mouth daily.       05/25/2022    1:19 PM 05/07/2022   10:05 AM 04/16/2022    2:13 PM 03/24/2022    2:44 PM  GAD 7 : Generalized Anxiety Score  Nervous, Anxious, on Edge 0 0 0 0  Control/stop worrying 0 0 0 0  Worry too much - different things 0 0 0 0  Trouble relaxing 0 0 0 0  Restless 0 0 0 0  Easily annoyed or irritable 0 0 0 0  Afraid - awful might happen 0 0 0 0  Total GAD 7 Score 0 0 0 0  Anxiety Difficulty Not difficult at all Not difficult at all Not difficult at all Not difficult at all       05/25/2022    1:19 PM 05/07/2022   10:05 AM 04/16/2022    2:13 PM  Depression screen PHQ 2/9  Decreased Interest 0 0 0  Down, Depressed, Hopeless 0 0 0  PHQ - 2 Score 0 0 0  Altered sleeping 0 0 0  Tired, decreased energy 0 0 0  Change in appetite 0 0 0  Feeling bad or failure about yourself  0 0 0  Trouble concentrating 0 0 0  Moving slowly or fidgety/restless 0 0 0  Suicidal thoughts 0 0 0  PHQ-9 Score 0 0 0  Difficult doing work/chores Not difficult at all Not difficult at all Not difficult at all    BP Readings from Last 3 Encounters:  05/25/22 128/72  05/07/22  120/88  04/16/22 130/70    Physical Exam Vitals and nursing note reviewed. Exam conducted with a chaperone present.  Constitutional:      General: She is not in acute distress.    Appearance: She is not diaphoretic.  HENT:     Head: Normocephalic and atraumatic.     Right Ear: External ear normal.     Left Ear: External  ear normal.     Nose: Nose normal.  Eyes:     General:        Right eye: No discharge.        Left eye: No discharge.     Conjunctiva/sclera: Conjunctivae normal.     Pupils: Pupils are equal, round, and reactive to light.  Neck:     Thyroid: No thyromegaly.     Vascular: No JVD.  Cardiovascular:     Rate and Rhythm: Normal rate and regular rhythm.     Heart sounds: Normal heart sounds. No murmur heard.    No friction rub. No gallop.  Pulmonary:     Effort: Pulmonary effort is normal.     Breath sounds: Normal breath sounds.  Abdominal:     General: Bowel sounds are normal.     Palpations: Abdomen is soft. There is no mass.     Tenderness: There is no abdominal tenderness. There is no guarding.  Musculoskeletal:        General: Normal range of motion.     Cervical back: Normal range of motion and neck supple.  Lymphadenopathy:     Cervical: No cervical adenopathy.  Skin:    General: Skin is warm and dry.  Neurological:     Mental Status: She is alert.     Deep Tendon Reflexes: Reflexes are normal and symmetric.     Wt Readings from Last 3 Encounters:  05/25/22 169 lb (76.7 kg)  05/07/22 167 lb (75.8 kg)  04/16/22 169 lb (76.7 kg)    BP 128/72   Pulse 76   Ht 5' (1.524 m)   Wt 169 lb (76.7 kg)   BMI 33.01 kg/m   Assessment and Plan: 1. Hypothyroidism, unspecified type Chronic.  Controlled.  Stable.  Patient is on proper dosing of levothyroxine at this time which is 88 mcg daily.  We will check TSH for current dosing and adjust accordingly. - TSH  2. Hypomagnesemia Chronic.  Uncontrolled.  Stable.  Patient was noted 2 weeks ago to have a  decrease magnesium however it is uncertain whether she was properly taking medication.  We will check her current magnesium and adjust accordingly - Magnesium     Otilio Miu, MD

## 2022-05-25 NOTE — Patient Instructions (Signed)

## 2022-05-26 LAB — MAGNESIUM: Magnesium: 1.8 mg/dL (ref 1.6–2.3)

## 2022-05-26 LAB — TSH: TSH: 0.166 u[IU]/mL — ABNORMAL LOW (ref 0.450–4.500)

## 2022-06-02 DIAGNOSIS — L603 Nail dystrophy: Secondary | ICD-10-CM | POA: Diagnosis not present

## 2022-06-02 DIAGNOSIS — E1142 Type 2 diabetes mellitus with diabetic polyneuropathy: Secondary | ICD-10-CM | POA: Diagnosis not present

## 2022-06-18 DIAGNOSIS — M545 Low back pain, unspecified: Secondary | ICD-10-CM | POA: Diagnosis not present

## 2022-06-18 DIAGNOSIS — R413 Other amnesia: Secondary | ICD-10-CM | POA: Diagnosis not present

## 2022-06-18 DIAGNOSIS — G479 Sleep disorder, unspecified: Secondary | ICD-10-CM | POA: Diagnosis not present

## 2022-06-18 DIAGNOSIS — G459 Transient cerebral ischemic attack, unspecified: Secondary | ICD-10-CM | POA: Diagnosis not present

## 2022-06-18 DIAGNOSIS — G8929 Other chronic pain: Secondary | ICD-10-CM | POA: Diagnosis not present

## 2022-06-22 DIAGNOSIS — R2689 Other abnormalities of gait and mobility: Secondary | ICD-10-CM | POA: Diagnosis not present

## 2022-06-22 DIAGNOSIS — E1159 Type 2 diabetes mellitus with other circulatory complications: Secondary | ICD-10-CM | POA: Diagnosis not present

## 2022-06-22 DIAGNOSIS — E782 Mixed hyperlipidemia: Secondary | ICD-10-CM | POA: Diagnosis not present

## 2022-06-22 DIAGNOSIS — Z79899 Other long term (current) drug therapy: Secondary | ICD-10-CM | POA: Diagnosis not present

## 2022-06-22 DIAGNOSIS — E1165 Type 2 diabetes mellitus with hyperglycemia: Secondary | ICD-10-CM | POA: Diagnosis not present

## 2022-06-22 DIAGNOSIS — E063 Autoimmune thyroiditis: Secondary | ICD-10-CM | POA: Diagnosis not present

## 2022-06-22 DIAGNOSIS — I152 Hypertension secondary to endocrine disorders: Secondary | ICD-10-CM | POA: Diagnosis not present

## 2022-06-25 ENCOUNTER — Other Ambulatory Visit: Payer: Self-pay | Admitting: Family Medicine

## 2022-06-25 ENCOUNTER — Encounter: Payer: Self-pay | Admitting: Family Medicine

## 2022-06-25 ENCOUNTER — Ambulatory Visit (INDEPENDENT_AMBULATORY_CARE_PROVIDER_SITE_OTHER): Payer: Medicare PPO | Admitting: Family Medicine

## 2022-06-25 VITALS — BP 124/76 | HR 86 | Ht 60.0 in | Wt 170.0 lb

## 2022-06-25 DIAGNOSIS — R399 Unspecified symptoms and signs involving the genitourinary system: Secondary | ICD-10-CM | POA: Diagnosis not present

## 2022-06-25 DIAGNOSIS — N342 Other urethritis: Secondary | ICD-10-CM

## 2022-06-25 LAB — POCT URINALYSIS DIPSTICK
Bilirubin, UA: NEGATIVE
Glucose, UA: POSITIVE — AB
Ketones, UA: NEGATIVE
Nitrite, UA: NEGATIVE
Protein, UA: POSITIVE — AB
Spec Grav, UA: 1.01 (ref 1.010–1.025)
Urobilinogen, UA: 0.2 E.U./dL
pH, UA: 7.5 (ref 5.0–8.0)

## 2022-06-25 MED ORDER — NITROFURANTOIN MONOHYD MACRO 100 MG PO CAPS: 100.0000 mg | ORAL_CAPSULE | Freq: Two times a day (BID) | ORAL | 0 refills | Status: AC

## 2022-06-25 MED ORDER — FLUCONAZOLE 150 MG PO TABS: 150.0000 mg | ORAL_TABLET | Freq: Once | ORAL | 0 refills | Status: AC

## 2022-06-25 NOTE — Progress Notes (Signed)
Date:  06/25/2022   Name:  Sara Carr   DOB:  1945-10-05   MRN:  [id number]   Chief Complaint: Dysuria (Pt states she has tried OTC medication with no relief. States it is painful after urination, itching. frequency)  Dysuria  This is a new problem. The current episode started 1 to 4 weeks ago. The problem occurs intermittently. The problem has been gradually improving. The quality of the pain is described as burning. The pain is mild. Associated symptoms include frequency and urgency. Pertinent negatives include no chills, flank pain, hematuria or nausea. Associated symptoms comments: Suprapubic discomfort.    Lab Results  Component Value Date   NA 137 05/04/2022   K 4.6 04/16/2022   CO2 23 (A) 05/04/2022   GLUCOSE 148 (H) 04/16/2022   BUN 10 05/04/2022   CREATININE 0.8 05/04/2022   CALCIUM 8.2 (A) 05/04/2022   EGFR 76 05/04/2022   GFRNONAA 54 (L) 11/10/2021   Lab Results  Component Value Date   CHOL 147 05/05/2021   HDL 39 (L) 05/05/2021   LDLCALC 83 05/05/2021   TRIG 140 05/05/2021   Lab Results  Component Value Date   TSH 0.166 (L) 05/25/2022   Lab Results  Component Value Date   HGBA1C 8.5 (H) 01/22/2020   Lab Results  Component Value Date   WBC 5.9 05/04/2022   HGB 10.5 (A) 05/04/2022   HCT 33 (A) 05/04/2022   MCV 88 11/15/2020   PLT 231 05/04/2022   Lab Results  Component Value Date   ALT 10 05/04/2022   AST 27 05/04/2022   ALKPHOS 57 05/04/2022   No results found for: "25OHVITD2", "25OHVITD3", "VD25OH"   Review of Systems  Constitutional: Negative.  Negative for chills and fever.  Respiratory:  Negative for shortness of breath and wheezing.   Gastrointestinal:  Negative for abdominal pain, blood in stool, constipation, diarrhea and nausea.  Genitourinary:  Positive for dysuria, frequency and urgency. Negative for flank pain, hematuria, pelvic pain and vaginal discharge.  Musculoskeletal:  Negative for arthralgias, back pain and myalgias.   Skin:  Negative for rash.  Neurological:  Negative for dizziness, weakness and headaches.  Hematological:  Negative for adenopathy. Does not bruise/bleed easily.  Psychiatric/Behavioral:  Negative for dysphoric mood. The patient is not nervous/anxious.     Patient Active Problem List   Diagnosis Date Noted   Lumbar radiculopathy 01/22/2020    Allergies  Allergen Reactions   Penicillins Hives and Swelling    Past Surgical History:  Procedure Laterality Date   BACK SURGERY     CHOLECYSTECTOMY  1979   JOINT REPLACEMENT Bilateral 2017   LUMBAR LAMINECTOMY/ DECOMPRESSION WITH MET-RX N/A 01/22/2020   Procedure: L3-4 laminectomy;  Surgeon: [name], MD;  Location: [facility] ORS;  Service: Neurosurgery;  Laterality: N/A;   REPLACEMENT TOTAL KNEE Bilateral    SPINE SURGERY  2004    Social History   Tobacco Use   Smoking status: Never   Smokeless tobacco: Never   Tobacco comments:    none  Vaping Use   Vaping Use: Never used  Substance Use Topics   Alcohol use: Never   Drug use: Never     Medication list has been reviewed and updated.  Current Meds  Medication Sig   acetaminophen (TYLENOL) 325 MG tablet Take 2 tablets (650 mg total) by mouth every 4 (four) hours as needed for mild pain ((score 1 to 3) or temp > 100.5).   atorvastatin (LIPITOR) 40 MG tablet Take  1 tablet (40 mg total) by mouth daily.   calcium carbonate (TUMS - DOSED IN MG ELEMENTAL CALCIUM) 500 MG chewable tablet Chew 1 tablet by mouth daily as needed for indigestion or heartburn.   carvedilol (COREG) 6.25 MG tablet Take 1 tablet (6.25 mg total) by mouth daily.   Continuous Blood Gluc Sensor (FREESTYLE LIBRE 14 DAY SENSOR) MISC    docusate sodium (COLACE) 50 MG capsule Take 1 capsule (50 mg total) by mouth daily.   Dulaglutide 1.5 MG/0.5ML SOPN Inject into the skin. Inject 0.5 mLs (1.5 mg total) subcutaneously every 7 (seven) days   gabapentin (NEURONTIN) 800 MG tablet Take 800 mg by mouth 2 (two) times  daily as needed (pain). blackwood   INVOKAMET XR 150-500 MG TB24 Take 1 tablet by mouth in the morning and at bedtime. 2 at breakfast 1 at night   lansoprazole (PREVACID) 30 MG capsule TAKE 1 CAPSULE EVERY DAY   levothyroxine (SYNTHROID) 88 MCG tablet Take 1 tablet (88 mcg total) by mouth daily before breakfast. Once a day   losartan (COZAAR) 25 MG tablet Take 1 tablet (25 mg total) by mouth daily.   meclizine (ANTIVERT) 25 MG tablet Take 1 tablet (25 mg total) by mouth 3 (three) times daily as needed for dizziness.   Multiple Vitamins-Minerals (ONE-A-DAY VITACRAVES ADULT) CHEW Chew 1 tablet by mouth daily. Patient take gummy   sertraline (ZOLOFT) 50 MG tablet Take 1 tablet (50 mg total) by mouth daily.       05/25/2022    1:19 PM 05/07/2022   10:05 AM 04/16/2022    2:13 PM 03/24/2022    2:44 PM  GAD 7 : Generalized Anxiety Score  Nervous, Anxious, on Edge 0 0 0 0  Control/stop worrying 0 0 0 0  Worry too much - different things 0 0 0 0  Trouble relaxing 0 0 0 0  Restless 0 0 0 0  Easily annoyed or irritable 0 0 0 0  Afraid - awful might happen 0 0 0 0  Total GAD 7 Score 0 0 0 0  Anxiety Difficulty Not difficult at all Not difficult at all Not difficult at all Not difficult at all       05/25/2022    1:19 PM 05/07/2022   10:05 AM 04/16/2022    2:13 PM  Depression screen PHQ 2/9  Decreased Interest 0 0 0  Down, Depressed, Hopeless 0 0 0  PHQ - 2 Score 0 0 0  Altered sleeping 0 0 0  Tired, decreased energy 0 0 0  Change in appetite 0 0 0  Feeling bad or failure about yourself  0 0 0  Trouble concentrating 0 0 0  Moving slowly or fidgety/restless 0 0 0  Suicidal thoughts 0 0 0  PHQ-9 Score 0 0 0  Difficult doing work/chores Not difficult at all Not difficult at all Not difficult at all    BP Readings from Last 3 Encounters:  06/25/22 124/76  05/25/22 128/72  05/07/22 120/88    Physical Exam Vitals and nursing note reviewed. Exam conducted with a chaperone present.   Constitutional:      General: She is not in acute distress.    Appearance: She is not diaphoretic.  HENT:     Head: Normocephalic and atraumatic.     Right Ear: Tympanic membrane and external ear normal.     Left Ear: Tympanic membrane and external ear normal.     Nose: Nose normal.     Mouth/Throat:  Mouth: Mucous membranes are moist.  Eyes:     General:        Right eye: No discharge.        Left eye: No discharge.     Conjunctiva/sclera: Conjunctivae normal.     Pupils: Pupils are equal, round, and reactive to light.  Neck:     Thyroid: No thyromegaly.     Vascular: No JVD.  Cardiovascular:     Rate and Rhythm: Normal rate and regular rhythm.     Heart sounds: Normal heart sounds. No murmur heard.    No friction rub. No gallop.  Pulmonary:     Effort: Pulmonary effort is normal.     Breath sounds: Normal breath sounds.  Abdominal:     General: Bowel sounds are normal.     Palpations: Abdomen is soft. There is no mass.     Tenderness: There is no abdominal tenderness. There is no right CVA tenderness, left CVA tenderness or guarding.  Musculoskeletal:        General: Normal range of motion.     Cervical back: Normal range of motion and neck supple.  Lymphadenopathy:     Cervical: No cervical adenopathy.  Skin:    General: Skin is warm and dry.  Neurological:     Mental Status: She is alert.     Deep Tendon Reflexes: Reflexes are normal and symmetric.    Wt Readings from Last 3 Encounters:  06/25/22 170 lb (77.1 kg)  05/25/22 169 lb (76.7 kg)  05/07/22 167 lb (75.8 kg)    BP 124/76   Pulse 86   Ht 5' (1.524 m)   Wt 170 lb (77.1 kg)   SpO2 98%   BMI 33.20 kg/m   Assessment and Plan: 1. UTI symptoms Patient over the past few days has been experiencing symptoms of frequency urgency dysuria and suprapubic discomfort.  Urinalysis notes some leukocytes and positive for nitrates.  This is consistent with an early bladder infection or urethritis.  We will send  urine for culture and initiate treatment. - POCT urinalysis dipstick - Urine Culture - nitrofurantoin, macrocrystal-monohydrate, (MACROBID) 100 MG capsule; Take 1 capsule (100 mg total) by mouth 2 (two) times daily for 7 days.  Dispense: 10 capsule; Refill: 0 - fluconazole (DIFLUCAN) 150 MG tablet; Take 1 tablet (150 mg total) by mouth once for 1 dose.  Dispense: 1 tablet; Refill: 0  2. Urethritis Primary concern is with discomfort in the urethra with passing urine.  There is some suprapubic tenderness without CVA tenderness.  We will treat with Macrobid 100 mg twice a day for 5 days and will follow back on the last day with a Diflucan in the event that there is some residual candidiasis in urethral area. - nitrofurantoin, macrocrystal-monohydrate, (MACROBID) 100 MG capsule; Take 1 capsule (100 mg total) by mouth 2 (two) times daily for 7 days.  Dispense: 10 capsule; Refill: 0 - fluconazole (DIFLUCAN) 150 MG tablet; Take 1 tablet (150 mg total) by mouth once for 1 dose.  Dispense: 1 tablet; Refill: 0     Otilio Miu, MD

## 2022-06-28 LAB — URINE CULTURE

## 2022-07-06 DIAGNOSIS — R2689 Other abnormalities of gait and mobility: Secondary | ICD-10-CM | POA: Diagnosis not present

## 2022-07-07 ENCOUNTER — Other Ambulatory Visit: Payer: Self-pay

## 2022-07-07 ENCOUNTER — Ambulatory Visit: Payer: Self-pay

## 2022-07-07 DIAGNOSIS — N3281 Overactive bladder: Secondary | ICD-10-CM

## 2022-07-07 MED ORDER — OXYBUTYNIN CHLORIDE ER 10 MG PO TB24: 10.0000 mg | ORAL_TABLET | Freq: Every day | ORAL | 0 refills | Status: DC

## 2022-07-07 NOTE — Telephone Encounter (Signed)
Message from Arther Dames sent at 07/07/2022 10:32 AM EST  Summary: Still having Uti symtoms   Patient was seen on 06/25/22 for UTI and given fluconazole (DIFLUCAN) 150 MG tablet and nitrofurantoin, macrocrystal-monohydrate, (MACROBID) 100 MG capsule. Patient states she is still having symptoms.         Chief Complaint: burning with urination Symptoms: no other sx Frequency: today Pertinent Negatives: Patient denies fever, flank pain, urgency or frequency with urination Disposition: [] ED /[] Urgent Care (no appt availability in office) / [] Appointment(In office/virtual)/ []  [facility]/ [] Home Care/ [] Refused Recommended Disposition /[] [facility]/ [x]  Follow-up with PCP Additional Notes: pt asking for another round of abx. Advised pt will send note to provider. Advised pt will call back of needs appt sooner than next weeks appt.  Called office and discussed assessment and if need OV  (only opening is with Dr. ). Advised to send note over for nurse to review. Reason for Disposition  Age > 50 years  Answer Assessment - Initial Assessment Questions 1. SEVERITY: "How bad is the pain?"  (e.g., Scale 1-10; mild, moderate, or severe)   - MILD (1-3): complains slightly about urination hurting   - MODERATE (4-7): interferes with normal activities     - SEVERE (8-10): excruciating, unwilling or unable to urinate because of the pain      mild 2. FREQUENCY: "How many times have you had painful urination today?"      N/a 3. PATTERN: "Is pain present every time you urinate or just sometimes?"      Every time 4. ONSET: "When did the painful urination start?"      N/a 5. FEVER: "Do you have a fever?" If Yes, ask: "What is your temperature, how was it measured, and when did it start?"     no 6. PAST UTI: "Have you had a urine infection before?" If Yes, ask: "When was the last time?" and "What happened that time?"      Yes- abx 7. CAUSE: "What do you think is causing  the painful urination?"  (e.g., UTI, scratch, Herpes sore)     UTI 8. OTHER SYMPTOMS: "Do you have any other symptoms?" (e.g., blood in urine, flank pain, genital sores, urgency, vaginal discharge)     no 9. PREGNANCY: "Is there any chance you are pregnant?" "When was your last menstrual period?"     N/a  Protocols used: Urination Pain - Female-A-AH

## 2022-07-16 DIAGNOSIS — R2689 Other abnormalities of gait and mobility: Secondary | ICD-10-CM | POA: Diagnosis not present

## 2022-07-20 ENCOUNTER — Ambulatory Visit: Payer: Medicare PPO | Admitting: Family Medicine

## 2022-07-22 ENCOUNTER — Ambulatory Visit (INDEPENDENT_AMBULATORY_CARE_PROVIDER_SITE_OTHER): Payer: Medicare PPO | Admitting: Family Medicine

## 2022-07-22 ENCOUNTER — Telehealth: Payer: Self-pay | Admitting: Family Medicine

## 2022-07-22 ENCOUNTER — Encounter: Payer: Self-pay | Admitting: Family Medicine

## 2022-07-22 VITALS — BP 122/78 | HR 80 | Ht 60.0 in | Wt 165.0 lb

## 2022-07-22 DIAGNOSIS — E1140 Type 2 diabetes mellitus with diabetic neuropathy, unspecified: Secondary | ICD-10-CM

## 2022-07-22 DIAGNOSIS — N3281 Overactive bladder: Secondary | ICD-10-CM

## 2022-07-22 DIAGNOSIS — R351 Nocturia: Secondary | ICD-10-CM

## 2022-07-22 MED ORDER — OXYBUTYNIN CHLORIDE ER 10 MG PO TB24: 10.0000 mg | ORAL_TABLET | Freq: Every day | ORAL | 1 refills | Status: DC

## 2022-07-22 NOTE — Telephone Encounter (Signed)
Pt called in to request to have her Rx for oxybutynin (DITROPAN XL) 10 MG 24 hr tablet  sent to her local pharmacy instead. Pt says that she need it sooner than later.   Pharmacy: [id number] - [place], [place] - [address] [phone]   Please assist further.

## 2022-07-22 NOTE — Progress Notes (Signed)
Date:  07/22/2022   Name:  Sara Carr   DOB:  1946/08/12   MRN:  [id number]   Chief Complaint: No chief complaint on file.  Urinary Frequency  This is a chronic problem. The current episode started more than 1 year ago. The problem has been waxing and waning. The pain is moderate. Associated symptoms include frequency and urgency. Pertinent negatives include no chills, discharge, hematuria, hesitancy or sweats.    Lab Results  Component Value Date   NA 137 05/04/2022   K 4.6 04/16/2022   CO2 23 (A) 05/04/2022   GLUCOSE 148 (H) 04/16/2022   BUN 10 05/04/2022   CREATININE 0.8 05/04/2022   CALCIUM 8.2 (A) 05/04/2022   EGFR 76 05/04/2022   GFRNONAA 54 (L) 11/10/2021   Lab Results  Component Value Date   CHOL 147 05/05/2021   HDL 39 (L) 05/05/2021   LDLCALC 83 05/05/2021   TRIG 140 05/05/2021   Lab Results  Component Value Date   TSH 0.166 (L) 05/25/2022   Lab Results  Component Value Date   HGBA1C 8.5 (H) 01/22/2020   Lab Results  Component Value Date   WBC 5.9 05/04/2022   HGB 10.5 (A) 05/04/2022   HCT 33 (A) 05/04/2022   MCV 88 11/15/2020   PLT 231 05/04/2022   Lab Results  Component Value Date   ALT 10 05/04/2022   AST 27 05/04/2022   ALKPHOS 57 05/04/2022   No results found for: "25OHVITD2", "25OHVITD3", "VD25OH"   Review of Systems  Constitutional:  Negative for chills and fever.  Respiratory:  Negative for shortness of breath and wheezing.   Cardiovascular:  Negative for chest pain.  Endocrine: Positive for polydipsia and polyuria.  Genitourinary:  Positive for frequency and urgency. Negative for difficulty urinating, dysuria, hematuria, hesitancy and pelvic pain.    Patient Active Problem List   Diagnosis Date Noted   Lumbar radiculopathy 01/22/2020    Allergies  Allergen Reactions   Penicillins Hives and Swelling    Past Surgical History:  Procedure Laterality Date   BACK SURGERY     CHOLECYSTECTOMY  1979   JOINT REPLACEMENT  Bilateral 2017   LUMBAR LAMINECTOMY/ DECOMPRESSION WITH MET-RX N/A 01/22/2020   Procedure: L3-4 laminectomy;  Surgeon: [name], MD;  Location: [facility];  Service: Neurosurgery;  Laterality: N/A;   REPLACEMENT TOTAL KNEE Bilateral    SPINE SURGERY  2004    Social History   Tobacco Use   Smoking status: Never   Smokeless tobacco: Never   Tobacco comments:    none  Vaping Use   Vaping Use: Never used  Substance Use Topics   Alcohol use: Never   Drug use: Never     Medication list has been reviewed and updated.  Current Meds  Medication Sig   acetaminophen (TYLENOL) 325 MG tablet Take 2 tablets (650 mg total) by mouth every 4 (four) hours as needed for mild pain ((score 1 to 3) or temp > 100.5).   atorvastatin (LIPITOR) 40 MG tablet Take 1 tablet (40 mg total) by mouth daily.   calcium carbonate (TUMS - DOSED IN MG ELEMENTAL CALCIUM) 500 MG chewable tablet Chew 1 tablet by mouth daily as needed for indigestion or heartburn.   carvedilol (COREG) 6.25 MG tablet Take 1 tablet (6.25 mg total) by mouth daily.   Continuous Blood Gluc Sensor (FREESTYLE LIBRE 14 DAY SENSOR) MISC    docusate sodium (COLACE) 50 MG capsule Take 1 capsule (50 mg total) by mouth  daily.   Dulaglutide 1.5 MG/0.5ML SOPN Inject into the skin. Inject 0.5 mLs (1.5 mg total) subcutaneously every 7 (seven) days   gabapentin (NEURONTIN) 800 MG tablet Take 800 mg by mouth 2 (two) times daily as needed (pain). blackwood   INVOKAMET XR 150-500 MG TB24 Take 1 tablet by mouth in the morning and at bedtime. 2 at breakfast 1 at night   lansoprazole (PREVACID) 30 MG capsule TAKE 1 CAPSULE EVERY DAY   levothyroxine (SYNTHROID) 88 MCG tablet Take 1 tablet (88 mcg total) by mouth daily before breakfast. Once a day   losartan (COZAAR) 25 MG tablet Take 1 tablet (25 mg total) by mouth daily.   meclizine (ANTIVERT) 25 MG tablet Take 1 tablet (25 mg total) by mouth 3 (three) times daily as needed for dizziness.   Multiple  Vitamins-Minerals (ONE-A-DAY VITACRAVES ADULT) CHEW Chew 1 tablet by mouth daily. Patient take gummy   oxybutynin (DITROPAN XL) 10 MG 24 hr tablet Take 1 tablet (10 mg total) by mouth at bedtime.   sertraline (ZOLOFT) 50 MG tablet Take 1 tablet (50 mg total) by mouth daily.       07/22/2022   11:40 AM 05/25/2022    1:19 PM 05/07/2022   10:05 AM 04/16/2022    2:13 PM  GAD 7 : Generalized Anxiety Score  Nervous, Anxious, on Edge 0 0 0 0  Control/stop worrying 0 0 0 0  Worry too much - different things 0 0 0 0  Trouble relaxing 0 0 0 0  Restless 0 0 0 0  Easily annoyed or irritable 0 0 0 0  Afraid - awful might happen 0 0 0 0  Total GAD 7 Score 0 0 0 0  Anxiety Difficulty Not difficult at all Not difficult at all Not difficult at all Not difficult at all       07/22/2022   11:40 AM 05/25/2022    1:19 PM 05/07/2022   10:05 AM  Depression screen PHQ 2/9  Decreased Interest 0 0 0  Down, Depressed, Hopeless 0 0 0  PHQ - 2 Score 0 0 0  Altered sleeping 0 0 0  Tired, decreased energy 0 0 0  Change in appetite 0 0 0  Feeling bad or failure about yourself  0 0 0  Trouble concentrating 0 0 0  Moving slowly or fidgety/restless 0 0 0  Suicidal thoughts 0 0 0  PHQ-9 Score 0 0 0  Difficult doing work/chores Not difficult at all Not difficult at all Not difficult at all    BP Readings from Last 3 Encounters:  07/22/22 122/78  06/25/22 124/76  05/25/22 128/72    Physical Exam Vitals and nursing note reviewed. Exam conducted with a chaperone present.  Constitutional:      General: She is not in acute distress.    Appearance: She is not diaphoretic.  HENT:     Head: Normocephalic and atraumatic.  Eyes:     General:        Right eye: No discharge.        Left eye: No discharge.  Neck:     Thyroid: No thyromegaly.     Vascular: No JVD.  Cardiovascular:     Rate and Rhythm: Normal rate and regular rhythm.     Heart sounds: Normal heart sounds. No murmur heard.    No friction rub.  No gallop.  Pulmonary:     Effort: Pulmonary effort is normal.     Breath sounds: Normal breath  sounds.  Abdominal:     General: Bowel sounds are normal.     Palpations: Abdomen is soft. There is no mass.     Tenderness: There is no abdominal tenderness. There is no guarding.  Musculoskeletal:        General: Normal range of motion.     Cervical back: Normal range of motion and neck supple.  Lymphadenopathy:     Cervical: No cervical adenopathy.  Skin:    General: Skin is warm and dry.  Neurological:     Mental Status: She is alert.     Deep Tendon Reflexes: Reflexes are normal and symmetric.     Wt Readings from Last 3 Encounters:  07/22/22 165 lb (74.8 kg)  06/25/22 170 lb (77.1 kg)  05/25/22 169 lb (76.7 kg)    BP 122/78   Pulse 80   Ht 5' (1.524 m)   Wt 165 lb (74.8 kg)   SpO2 98%   BMI 32.22 kg/m   Assessment and Plan:  1. Overactive bladder Chronic.  Persistent.  Patient has been having frequency but says that she urinates a fair amount of urine each episode.  She does drink a lot of water as instructed since she got dehydrated but I told her she does not need to maintain the level of hydration that that warranted at this time.  Trial of oxybutynin 1 tablet mouth at bedtime has not alleviated the nocturia.  We will refer to urology to see if there is other reasons for the frequency/urgency/nocturia. - oxybutynin (DITROPAN XL) 10 MG 24 hr tablet; Take 1 tablet (10 mg total) by mouth at bedtime.  Dispense: 90 tablet; Refill: 1 - Ambulatory referral to Urology  2. Excessive urination at night As noted above - Ambulatory referral to Urology  3. Type 2 diabetes mellitus with diabetic neuropathy, without long-term current use of insulin (HCC) Chronic.  Controlled.  Stable.  Followed by endocrinology.  Patient is currently on a regimen of invoke a met XR 1 50-500 twice a day and fasting blood sugar this morning was 128 mg percent.  Although the diabetes may be  contributing is not out of control as it once was and I am not certain that the hyperglycemia is playing a role in this.   Otilio Miu, MD

## 2022-07-27 DIAGNOSIS — R2689 Other abnormalities of gait and mobility: Secondary | ICD-10-CM | POA: Diagnosis not present

## 2022-08-14 ENCOUNTER — Telehealth: Payer: Self-pay | Admitting: Family Medicine

## 2022-08-14 NOTE — Telephone Encounter (Signed)
Copied from CRM [id number]. Topic: Medicare AWV >> Aug 14, 2022 11:41 AM Payton Doughty wrote: Reason for CRM: LVM 08/14/22 AWV appt changed from 08/19/22 @2pm  to 08/21/21 at 2pm.  Please comfirm appt change khc

## 2022-08-19 ENCOUNTER — Ambulatory Visit: Payer: Medicare PPO

## 2022-08-19 ENCOUNTER — Other Ambulatory Visit: Payer: Self-pay | Admitting: Family Medicine

## 2022-08-19 DIAGNOSIS — R5383 Other fatigue: Secondary | ICD-10-CM

## 2022-08-19 DIAGNOSIS — K219 Gastro-esophageal reflux disease without esophagitis: Secondary | ICD-10-CM

## 2022-08-19 DIAGNOSIS — F324 Major depressive disorder, single episode, in partial remission: Secondary | ICD-10-CM

## 2022-08-19 DIAGNOSIS — E7801 Familial hypercholesterolemia: Secondary | ICD-10-CM

## 2022-08-19 DIAGNOSIS — I10 Essential (primary) hypertension: Secondary | ICD-10-CM

## 2022-08-21 ENCOUNTER — Ambulatory Visit (INDEPENDENT_AMBULATORY_CARE_PROVIDER_SITE_OTHER): Payer: Medicare PPO

## 2022-08-21 DIAGNOSIS — Z0000 Encounter for general adult medical examination without abnormal findings: Secondary | ICD-10-CM | POA: Diagnosis not present

## 2022-08-21 NOTE — Progress Notes (Signed)
I connected with  Sara Carr on 08/21/22 by a audio enabled telemedicine application and verified that I am speaking with the correct person using two identifiers.  Patient Location: Home  Provider Location: Office/Clinic  I discussed the limitations of evaluation and management by telemedicine. The patient expressed understanding and agreed to proceed.  Subjective:   Sara Carr is a 77 y.o. female who presents for Medicare Annual (Subsequent) preventive examination.  Review of Systems    Per HPI unless specifically indicated below.  Cardiac Risk Factors include: advanced age (>35men, >15 women);female gender          Objective:       07/22/2022   11:36 AM 06/25/2022   11:20 AM 05/25/2022    1:14 PM  Vitals with BMI  Height 5\' 0"  5\' 0"  5\' 0"   Weight 165 lbs 170 lbs 169 lbs  BMI 32.22 33.2 33.01  Systolic 122 124  Diastolic 78 76 72  Pulse 80 86 76    There were no vitals filed for this visit. There is no height or weight on file to calculate BMI.     08/21/2022    1:43 PM 03/21/2022   12:16 PM 08/13/2021    2:22 PM 08/07/2020    2:16 PM 01/22/2020    5:00 PM 01/22/2020    6:25 AM 01/17/2020    2:41 PM  Advanced Directives  Does Patient Have a Medical Advance Directive? Yes Yes Yes Yes Yes  Yes  Type of 03/23/2020 of [place];Living will Living will Healthcare Power of [place];Living will Healthcare Power of [place];Living will Healthcare Power of [place];Living will Healthcare Power of [place];Living will Living will  Does patient want to make changes to medical advance directive? No - Patient declined    No - Patient declined    Copy of Healthcare Power of Attorney in Chart? Yes - validated most recent copy scanned in chart (See row information)  Yes - validated most recent copy scanned in chart (See row information) Yes - validated most recent copy scanned in chart (See row information) Yes - validated most recent copy scanned in chart  (See row information) Yes - validated most recent copy scanned in chart (See row information)     Current Medications (verified) Outpatient Encounter Medications as of 08/21/2022  Medication Sig   atorvastatin (LIPITOR) 40 MG tablet TAKE 1 TABLET EVERY DAY   carvedilol (COREG) 6.25 MG tablet TAKE 1 TABLET EVERY DAY   Continuous Blood Gluc Sensor (FREESTYLE LIBRE 14 DAY SENSOR) MISC    docusate sodium (COLACE) 50 MG capsule Take 1 capsule (50 mg total) by mouth daily.   Dulaglutide 1.5 MG/0.5ML SOPN Inject into the skin. Inject 0.5 mLs (1.5 mg total) subcutaneously every 7 (seven) days   gabapentin (NEURONTIN) 800 MG tablet Take 800 mg by mouth 2 (two) times daily as needed (pain). [place]   INVOKAMET XR 150-500 MG TB24 Take 1 tablet by mouth in the morning and at bedtime. 2 at breakfast 1 at night   lansoprazole (PREVACID) 30 MG capsule TAKE 1 CAPSULE EVERY DAY   levothyroxine (SYNTHROID) 88 MCG tablet Take 1 tablet (88 mcg total) by mouth daily before breakfast. Once a day   losartan (COZAAR) 25 MG tablet TAKE 1 TABLET (25 MG TOTAL) BY MOUTH DAILY.   meclizine (ANTIVERT) 25 MG tablet Take 1 tablet (25 mg total) by mouth 3 (three) times daily as needed for dizziness.   oxybutynin (DITROPAN XL) 10 MG 24  hr tablet Take 1 tablet (10 mg total) by mouth at bedtime.   sertraline (ZOLOFT) 50 MG tablet TAKE 1 TABLET EVERY DAY   acetaminophen (TYLENOL) 325 MG tablet Take 2 tablets (650 mg total) by mouth every 4 (four) hours as needed for mild pain ((score 1 to 3) or temp > 100.5). (Patient not taking: Reported on 08/21/2022)   calcium carbonate (TUMS - DOSED IN MG ELEMENTAL CALCIUM) 500 MG chewable tablet Chew 1 tablet by mouth daily as needed for indigestion or heartburn. (Patient not taking: Reported on 08/21/2022)   Multiple Vitamins-Minerals (ONE-A-DAY VITACRAVES ADULT) CHEW Chew 1 tablet by mouth daily. Patient take gummy (Patient not taking: Reported on 08/21/2022)   No facility-administered encounter  medications on file as of 08/21/2022.    Allergies (verified) Penicillins   History: Past Medical History:  Diagnosis Date   Allergy    Anemia    as a little gitrl, but not anymore    Arthritis    Back pain    Depression    Diabetes mellitus without complication (HCC)    GERD (gastroesophageal reflux disease)    Hyperlipidemia    Hypertension    Hypothyroidism    Thyroid disease    Past Surgical History:  Procedure Laterality Date   BACK SURGERY     CHOLECYSTECTOMY  1979   JOINT REPLACEMENT Bilateral 2017   LUMBAR LAMINECTOMY/ DECOMPRESSION WITH MET-RX N/A 01/22/2020   Procedure: L3-4 laminectomy;  Surgeon: Lucy Chrisook, Steven, MD;  Location: [facility] ORS;  Service: Neurosurgery;  Laterality: N/A;   REPLACEMENT TOTAL KNEE Bilateral    SPINE SURGERY  2004   Family History  Problem Relation Age of Onset   Stroke Mother    Heart disease Father    Diabetes Father    Hypertension Father    Drug abuse Daughter    Early death Brother    Breast cancer Neg Hx    Social History   Socioeconomic History   Marital status: Married    Spouse name: Nestor RampJames Kubota   Number of children: 2   Years of education: Not on file   Highest education level: Not on file  Occupational History   Occupation: Retired  Tobacco Use   Smoking status: Never   Smokeless tobacco: Never   Tobacco comments:    none  Vaping Use   Vaping Use: Never used  Substance and Sexual Activity   Alcohol use: Never   Drug use: Never   Sexual activity: Not Currently    Birth control/protection: None  Other Topics Concern   Not on file  Social History Narrative   Not on file   Social Determinants of Health   Financial Resource Strain: Low Risk  (08/21/2022)   Overall Financial Resource Strain (CARDIA)    Difficulty of Paying Living Expenses: Not hard at all  Food Insecurity: No Food Insecurity (08/21/2022)   Hunger Vital Sign    Worried About Running Out of Food in the Last Year: Never true    Ran Out of Food in the  Last Year: Never true  Transportation Needs: No Transportation Needs (08/21/2022)   PRAPARE - Administrator, Civil ServiceTransportation    Lack of Transportation (Medical): No    Lack of Transportation (Non-Medical): No  Physical Activity: Sufficiently Active (08/21/2022)   Exercise Vital Sign    Days of Exercise per Week: 7 days    Minutes of Exercise per Session: 40 min  Stress: No Stress Concern Present (08/21/2022)   [employer] - Occupational  Stress Questionnaire    Feeling of Stress : Not at all  Social Connections: Socially Isolated (08/21/2022)   Social Connection and Isolation Panel [NHANES]    Frequency of Communication with Friends and Family: Twice a week    Frequency of Social Gatherings with Friends and Family: Never    Attends Religious Services: Never    Diplomatic Services operational officer: No    Attends Engineer, structural: Never    Marital Status: Married    Tobacco Counseling Counseling given: Not Answered Tobacco comments: none   Clinical Intake:  Pre-visit preparation completed: No  Pain : No/denies pain     Nutritional Status: BMI of 19-24  Normal Nutritional Risks: Nausea/ vomitting/ diarrhea Diabetes: Yes CBG done?: Yes CBG resulted in Enter/ Edit results?: Yes Did pt. bring in CBG monitor from home?: No     Diabetic?No  Interpreter Needed?: No  Information entered by :: Laurel Dimmer, CMA   Activities of Daily Living    08/21/2022    1:22 PM 11/03/2021    8:56 AM  In your present state of health, do you have any difficulty performing the following activities:  Hearing? 0 0  Vision? 1 0  Comment blurry vision x 1 yr ago   Difficulty concentrating or making decisions? 1 0  Walking or climbing stairs? 1 0  Dressing or bathing? 0 0  Doing errands, shopping? 0 0    Patient Care Team: Duanne Limerick, MD as PCP - General (Family Medicine) Sherlon Handing, MD as Consulting Physician (Internal Medicine)  Indicate any  recent Medical Services you may have received from other than [facility] providers in the past year (date may be approximate).    The pt was seen on 07/27/2022 at [facility]/ Occupational Therapy for stroke like symptoms. Assessment:   This is a routine wellness examination for Sadeen.  Hearing/Vision screen Denies any hearing issues. Denies any vision issues, Wear glasses, Overdue for her Annual Eye exam.   Dietary issues and exercise activities discussed: Current Exercise Habits: Structured exercise class, Type of exercise: walking, Time (Minutes): 45, Frequency (Times/Week): 7, Weekly Exercise (Minutes/Week): 315, Intensity: Moderate, Exercise limited by: None identified   Goals Addressed   None    Depression Screen    08/21/2022    1:19 PM 07/22/2022   11:40 AM 05/25/2022    1:19 PM 05/07/2022   10:05 AM 04/16/2022    2:13 PM 03/24/2022    2:43 PM 11/03/2021    8:55 AM  PHQ 2/9 Scores  PHQ - 2 Score 0 0 0 0 0 0 2  PHQ- 9 Score 0 0 0 0 0 0 8    Fall Risk    08/21/2022    1:21 PM 07/22/2022   11:40 AM 05/25/2022    1:19 PM 04/16/2022    2:13 PM 11/03/2021    8:57 AM  Fall Risk   Falls in the past year? 1 0 0 0 0  Number falls in past yr: 0 0 0 0 0  Injury with Fall? 1 0 0 0 0  Risk for fall due to : Impaired balance/gait No Fall Risks No Fall Risks No Fall Risks No Fall Risks  Follow up Falls evaluation completed Falls evaluation completed Falls evaluation completed Falls evaluation completed Falls evaluation completed    FALL RISK PREVENTION PERTAINING TO THE HOME:  Any stairs in or around the home? No  If so, are there any without handrails? No  Home free of loose throw rugs in walkways, pet beds, electrical cords, etc? Yes  Adequate lighting in your home to reduce risk of falls? Yes   ASSISTIVE DEVICES UTILIZED TO PREVENT FALLS:  Life alert? No  Use of a cane, walker or w/c? Yes  Grab bars in the bathroom? No  Shower chair or bench in shower? Yes   Elevated toilet seat or a handicapped toilet? No   TIMED UP AND GO:  Was the test performed? No, virtual telephonic visit .  Cognitive Function:        08/21/2022    1:23 PM 08/13/2021    2:26 PM 08/07/2020    2:21 PM  6CIT Screen  What Year? 0 points 0 points 0 points  What month? 0 points 0 points 0 points  What time? 0 points 0 points 0 points  Count back from 20 0 points 0 points 0 points  Months in reverse 0 points 4 points 4 points  Repeat phrase 2 points 0 points 4 points  Total Score 2 points 4 points 8 points    Immunizations Immunization History  Administered Date(s) Administered   Fluad Quad(high Dose 65+) 06/27/2019, 05/30/2020, 05/05/2021, 05/07/2022   Influenza-Unspecified 10/14/2006, 07/13/2016   PFIZER Comirnaty(Gray Top)Covid-19 Tri-Sucrose Vaccine 08/29/2019, 09/16/2019, 06/10/2020, 03/24/2021   PFIZER(Purple Top)SARS-COV-2 Vaccination 08/29/2019, 09/16/2019, 06/10/2020   Pfizer Covid-19 Vaccine Bivalent Booster 42yrs & up 10/03/2021   Pneumococcal Conjugate-13 06/27/2019   Pneumococcal Polysaccharide-23 07/13/2016   Respiratory Syncytial Virus Vaccine,Recomb Aduvanted(Arexvy) 05/23/2022   Tdap 11/16/2015   Zoster, Live 02/27/2011    TDAP status: Up to date  Flu Vaccine status: Up to date  Pneumococcal vaccine status: Up to date  Covid-19 vaccine status: Information provided on how to obtain vaccines.   Qualifies for Shingles Vaccine? Yes   Zostavax completed No   Shingrix Completed?: No.    Education has been provided regarding the importance of this vaccine. Patient has been advised to call insurance company to determine out of pocket expense if they have not yet received this vaccine. Advised may also receive vaccine at local pharmacy or Health Dept. Verbalized acceptance and understanding.  Screening Tests Health Maintenance  Topic Date Due   FOOT EXAM  Never done   OPHTHALMOLOGY EXAM  Never done   Diabetic kidney evaluation - Urine ACR   Never done   Zoster Vaccines- Shingrix (1 of 2) Never done   HEMOGLOBIN A1C  10/22/2021   COVID-19 Vaccine (9 - 2023-24 season) 04/17/2022   Hepatitis C Screening  05/08/2023 (Originally 06/07/1964)   Diabetic kidney evaluation - eGFR measurement  05/05/2023   Medicare Annual Wellness (AWV)  08/22/2023   DTaP/Tdap/Td (2 - Td or Tdap) 11/15/2025   Pneumonia Vaccine 40+ Years old  Completed   INFLUENZA VACCINE  Completed   DEXA SCAN  Completed   HPV VACCINES  Aged Out   COLONOSCOPY (Pts 45-93yrs Insurance coverage will need to be confirmed)  Discontinued    Health Maintenance  Health Maintenance Due  Topic Date Due   FOOT EXAM  Never done   OPHTHALMOLOGY EXAM  Never done   Diabetic kidney evaluation - Urine ACR  Never done   Zoster Vaccines- Shingrix (1 of 2) Never done   HEMOGLOBIN A1C  10/22/2021   COVID-19 Vaccine (9 - 2023-24 season) 04/17/2022    Colorectal cancer screening: No longer required.   Mammogram status: No longer required due to age.  DEXA Scan: 09/30/2020  Lung Cancer Screening: (Low Dose CT Chest recommended  if Age 67-80 years, 47 pack-year currently smoking OR have quit w/in 15years.) does not qualify.   Lung Cancer Screening Referral: not applicable   Additional Screening:  Hepatitis C Screening: does qualify; postponed   Vision Screening: Recommended annual ophthalmology exams for early detection of glaucoma and other disorders of the eye. Is the patient up to date with their annual eye exam?  No  Who is the provider or what is the name of the office in which the patient attends annual eye exams? Recommended yearly ophthalmology/optometry visit for glaucoma screening and checkup  If pt is not established with a provider, would they like to be referred to a provider to establish care? No .   Dental Screening: Recommended annual dental exams for proper oral hygiene  Community Resource Referral / Chronic Care Management: CRR required this visit?  No    CCM required this visit?  No      Plan:     I have personally reviewed and noted the following in the patient's chart:   Medical and social history Use of alcohol, tobacco or illicit drugs  Current medications and supplements including opioid prescriptions. Patient is not currently taking opioid prescriptions. Functional ability and status Nutritional status Physical activity Advanced directives List of other physicians Hospitalizations, surgeries, and ER visits in previous 12 months Vitals Screenings to include cognitive, depression, and falls Referrals and appointments  In addition, I have reviewed and discussed with patient certain preventive protocols, quality metrics, and best practice recommendations. A written personalized care plan for preventive services as well as general preventive health recommendations were provided to patient.     . Ms. Duch , Thank you for taking time to come for your Medicare Wellness Visit. I appreciate your ongoing commitment to your health goals. Please review the following plan we discussed and let me know if I can assist you in the future.   These are the goals we discussed:  Goals      Pharmacy care plan     CARE PLAN ENTRY (see longitudinal plan of care for additional care plan information)  Current Barriers:  Chronic Disease Management support, education, and care coordination needs related to Hypertension, Hyperlipidemia, Diabetes, GERD, Hypothyroidism, Depression, and Overactive Bladder   Hypertension BP Readings from Last 3 Encounters:  08/07/20 122/80  05/30/20 130/80  01/23/20 (!) 112/57  Pharmacist Clinical Goal(s): Over the next 30 days, patient will work with PharmD and providers to achieve BP goal <130/80 Current regimen:  carvedilol 6.25 mg bid Losartan 25 mg qd Interventions:  Counseled on importance of SMBP.  Reviewed goal BP Counseled on diet and exercise Patient self care activities - Over the next 30  days, patient will: Call [employer] to einquire about OTC benefits for BP cuff, if not ReliOn brand inexpensive option Check BP 2-3 times weekly, document, and provide at future appointments Ensure daily salt intake < 2300 mg/day   Hyperlipidemia Lab Results  Component Value Date/Time   LDLCALC 64 05/20/2018 12:00 AM  Pharmacist Clinical Goal(s): Over the next 30 days, patient will work with PharmD and providers to maintain LDL goal < 70 Current regimen:  Atorvastatin 40 mg qd? Interventions: Provided diet and exercise counseling. Patient self care activities - Over the next 30 days, patient will: Take medications as prescribed    Diabetes HBA1C    7.5%        08/06/21 @ [facility] Lab Results  Component Value Date/Time   HGBA1C 8.5 (H) 01/22/2020 12:12 PM  HGBA1C 7.8 11/03/2019 12:00 AM   HGBA1C 7.5 08/18/2018 12:00 AM  Pharmacist Clinical Goal(s): Over the next 30 days, patient will work with PharmD and providers to achieve A1c goal <7% Current regimen:  Trulicity 2.13 mg qd Invokamet 150/500 mg bid Interventions: Reviewed goal glucose readings for an A1c of <7%, we want to see fasting sugars <130 and 2 hour after meal sugars <180.   Verified patient is over income for patient assistance. Reviewed insurance formulary-- Trulicity and Ozempic tier 3 through mail order. Patient currently receiving Trulicity for $08/MVHQI Provided diet and exercise counseling. Reviewed recent BG reading 96, 117, 136 with weekly average of 126 per Freestyle Libre No readings >200 or <70 Patient self care activities - Over the next 60 days, patient will: Check blood sugar 6 times daily, document, and provide at future appointments Contact provider with any episodes of hypoglycemia    Medication management Pharmacist Clinical Goal(s): Over the next 60 days, patient will work with PharmD and providers to achieve optimal medication adherence Current pharmacy: [employer]  mailorder Interventions Comprehensive medication review performed. Continue current medication management strategy Patient self care activities - Over the next 90 days, patient will: Focus on medication adherence by using pill box  Take medications as prescribed Report any questions or concerns to PharmD and/or provider(s)  Please see past updates related to this goal by clicking on the "Past Updates" button in the selected goal       PharmD- Monitor and Manage My Blood Sugar-Diabetes Type 2     Timeframe:  Long-Range Goal Priority:  High Start Date:                             Expected End Date:                       Follow Up Date  6- 8 week CPA, 3 month PharmD   - check blood sugar at prescribed times - check blood sugar if I feel it is too high or too low - take the blood sugar meter to all doctor visits    Why is this important?   Checking your blood sugar at home helps to keep it from getting very high or very low.  Writing the results in a diary or log helps the doctor know how to care for you.  Your blood sugar log should have the time, date and the results.  Also, write down the amount of insulin or other medicine that you take.  Other information, like what you ate, exercise done and how you were feeling, will also be helpful.     Notes:      Track and Manage My Blood Pressure-Hypertension     Timeframe:  Short-Term Goal Priority:  High Start Date:                             Expected End Date:                       Follow Up Date 6-8 week CPA 3 month Pharmd   -obtain blood pressure cuff - check blood pressure 3 times per week - write blood pressure results in a log or diary    Why is this important?   You won't feel high blood pressure, but it can still hurt your blood vessels.  High blood  pressure can cause heart or kidney problems. It can also cause a stroke.  Making lifestyle changes like losing a little weight or eating less salt will help.  Checking  your blood pressure at home and at different times of the day can help to control blood pressure.  If the doctor prescribes medicine remember to take it the way the doctor ordered.  Call the office if you cannot afford the medicine or if there are questions about it.     Notes:         This is a list of the screening recommended for you and due dates:  Health Maintenance  Topic Date Due   Complete foot exam   Never done   Eye exam for diabetics  Never done   Yearly kidney health urinalysis for diabetes  Never done   Zoster (Shingles) Vaccine (1 of 2) Never done   Hemoglobin A1C  10/22/2021   COVID-19 Vaccine (9 - 2023-24 season) 04/17/2022   Hepatitis C Screening: USPSTF Recommendation to screen - Ages 18-79 yo.  05/08/2023*   Yearly kidney function blood test for diabetes  05/05/2023   Medicare Annual Wellness Visit  08/22/2023   DTaP/Tdap/Td vaccine (2 - Td or Tdap) 11/15/2025   Pneumonia Vaccine  Completed   Flu Shot  Completed   DEXA scan (bone density measurement)  Completed   HPV Vaccine  Aged Out   Colon Cancer Screening  Discontinued  *Topic was postponed. The date shown is not the original due date.   Screening recommendations/referrals: Colonoscopy: no longer required Mammogram: done 09/30/20 Bone Density: done 09/30/20 Recommended yearly ophthalmology/optometry visit for glaucoma screening and checkup Recommended yearly dental visit for hygiene and checkup   Sara Carr, [facility] HospitalCMA   08/21/2022   Nurse Notes: Approximately 30 minute Non-Face -To-Face Medicare Wellness Visit

## 2022-08-21 NOTE — Patient Instructions (Signed)

## 2022-08-26 ENCOUNTER — Telehealth: Payer: Medicare PPO | Admitting: Family Medicine

## 2022-08-26 DIAGNOSIS — M545 Low back pain, unspecified: Secondary | ICD-10-CM | POA: Diagnosis not present

## 2022-08-26 DIAGNOSIS — G8929 Other chronic pain: Secondary | ICD-10-CM | POA: Diagnosis not present

## 2022-08-26 DIAGNOSIS — G459 Transient cerebral ischemic attack, unspecified: Secondary | ICD-10-CM | POA: Diagnosis not present

## 2022-08-26 DIAGNOSIS — R413 Other amnesia: Secondary | ICD-10-CM | POA: Diagnosis not present

## 2022-08-26 DIAGNOSIS — G479 Sleep disorder, unspecified: Secondary | ICD-10-CM | POA: Diagnosis not present

## 2022-08-26 DIAGNOSIS — E1069 Type 1 diabetes mellitus with other specified complication: Secondary | ICD-10-CM | POA: Diagnosis not present

## 2022-09-01 ENCOUNTER — Other Ambulatory Visit: Payer: Self-pay | Admitting: Family Medicine

## 2022-09-01 DIAGNOSIS — E039 Hypothyroidism, unspecified: Secondary | ICD-10-CM

## 2022-09-01 NOTE — Telephone Encounter (Signed)
Requested medication (s) are due for refill today - yes  Requested medication (s) are on the active medication list -yes  Future visit scheduled -no  Last refill: 05/07/22 #90  Notes to clinic: went for PCP review of request- last RF has notes  Requested Prescriptions  Pending Prescriptions Disp Refills   levothyroxine (SYNTHROID) 88 MCG tablet [Pharmacy Med Name: LEVOTHYROXINE SODIUM 88 MCG Tablet] 90 tablet 3    Sig: TAKE 1 TABLET (88 MCG TOTAL) BY MOUTH DAILY BEFORE BREAKFAST (ENDOCRINOLOGY WILL TAKE OVER IN [place])     Endocrinology:  Hypothyroid Agents Failed - 09/01/2022  9:01 AM      Failed - TSH in normal range and within 360 days    TSH  Date Value Ref Range Status  05/25/2022 0.166 (L) 0.450 - 4.500 uIU/mL Final         Passed - Valid encounter within last 12 months    Recent Outpatient Visits           1 month ago Overactive bladder   [place] [facility] [place], Deanna C, MD   2 months ago UTI symptoms   [place] [facility] [place], Deanna C, MD   3 months ago Hypothyroidism, unspecified type   [facility] at [place], Deanna C, MD   3 months ago Essential hypertension   [place] [facility] at [place], Deanna C, MD   4 months ago Colon cancer screening   [place] [facility] at [place], Deanna C, MD       Future Appointments             In 2 weeks MacDiarmid, Nicki Reaper, MD [facility]                Requested Prescriptions  Pending Prescriptions Disp Refills   levothyroxine (SYNTHROID) 88 MCG tablet [Pharmacy Med Name: LEVOTHYROXINE SODIUM 88 MCG Tablet] 90 tablet 3    Sig: TAKE 1 TABLET (88 MCG TOTAL) BY MOUTH DAILY BEFORE BREAKFAST (ENDOCRINOLOGY WILL TAKE OVER IN [place])     Endocrinology:  Hypothyroid Agents Failed -  09/01/2022  9:01 AM      Failed - TSH in normal range and within 360 days    TSH  Date Value Ref Range Status  05/25/2022 0.166 (L) 0.450 - 4.500 uIU/mL Final         Passed - Valid encounter within last 12 months    Recent Outpatient Visits           1 month ago Overactive bladder   [place] [facility] [place], Deanna C, MD   2 months ago UTI symptoms   [place] [facility] at [place], Deanna C, MD   3 months ago Hypothyroidism, unspecified type   [facility] at [place], Deanna C, MD   3 months ago Essential hypertension    [facility] at [place], Deanna C, MD   4 months ago Colon cancer screening   [facility] at [place], Deanna C, MD       Future Appointments             In 2 weeks MacDiarmid, Nicki Reaper, MD [facility] Urology [facility]

## 2022-09-02 ENCOUNTER — Other Ambulatory Visit: Payer: Self-pay | Admitting: Family Medicine

## 2022-09-02 DIAGNOSIS — Z1231 Encounter for screening mammogram for malignant neoplasm of breast: Secondary | ICD-10-CM

## 2022-09-07 DIAGNOSIS — I152 Hypertension secondary to endocrine disorders: Secondary | ICD-10-CM | POA: Diagnosis not present

## 2022-09-07 DIAGNOSIS — E1159 Type 2 diabetes mellitus with other circulatory complications: Secondary | ICD-10-CM | POA: Diagnosis not present

## 2022-09-07 DIAGNOSIS — Z79899 Other long term (current) drug therapy: Secondary | ICD-10-CM | POA: Diagnosis not present

## 2022-09-07 DIAGNOSIS — E785 Hyperlipidemia, unspecified: Secondary | ICD-10-CM | POA: Diagnosis not present

## 2022-09-07 DIAGNOSIS — E782 Mixed hyperlipidemia: Secondary | ICD-10-CM | POA: Diagnosis not present

## 2022-09-07 DIAGNOSIS — E063 Autoimmune thyroiditis: Secondary | ICD-10-CM | POA: Diagnosis not present

## 2022-09-07 DIAGNOSIS — E1165 Type 2 diabetes mellitus with hyperglycemia: Secondary | ICD-10-CM | POA: Diagnosis not present

## 2022-09-07 DIAGNOSIS — E1169 Type 2 diabetes mellitus with other specified complication: Secondary | ICD-10-CM | POA: Diagnosis not present

## 2022-09-21 ENCOUNTER — Ambulatory Visit: Payer: Medicare PPO | Admitting: Urology

## 2022-09-21 ENCOUNTER — Encounter: Payer: Self-pay | Admitting: Urology

## 2022-09-21 VITALS — BP 141/84 | HR 92 | Ht 60.0 in | Wt 160.0 lb

## 2022-09-21 DIAGNOSIS — M5416 Radiculopathy, lumbar region: Secondary | ICD-10-CM

## 2022-09-21 DIAGNOSIS — N3001 Acute cystitis with hematuria: Secondary | ICD-10-CM

## 2022-09-21 DIAGNOSIS — N3281 Overactive bladder: Secondary | ICD-10-CM | POA: Diagnosis not present

## 2022-09-21 LAB — URINALYSIS, COMPLETE
Bilirubin, UA: NEGATIVE
Ketones, UA: NEGATIVE
Nitrite, UA: NEGATIVE
Specific Gravity, UA: 1.015 (ref 1.005–1.030)
Urobilinogen, Ur: 0.2 mg/dL (ref 0.2–1.0)
pH, UA: 5.5 (ref 5.0–7.5)

## 2022-09-21 LAB — MICROSCOPIC EXAMINATION: WBC, UA: >30 /hpf — AB (ref 0–5)

## 2022-09-21 MED ORDER — CIPROFLOXACIN HCL 250 MG PO TABS: 250.0000 mg | ORAL_TABLET | Freq: Two times a day (BID) | ORAL | 0 refills | Status: AC

## 2022-09-21 NOTE — Progress Notes (Signed)
09/21/2022 11:04 AM   Sara Carr Aug 03, 1946 [id number]  Referring provider: Juline Patch, MD [address] [place],  [place] [address]  Chief Complaint  Patient presents with   New Patient (Initial Visit)   Over Active Bladder    HPI: I was consulted to assess the patient who complains of pain at the end of urination that last for many seconds.  Is been present for approximately 3 months.  She does not appear to get pain otherwise.  She voids about 6-7 times a day and gets up 3 times a night.  She has never smoked.  She is on oxybutynin but is not helping  She is continent.  She has not had a hysterectomy.  She has had a stroke and is on oral hypoglycemics  She had a urine culture June 25, 2022 that was normal  No history of kidney stones or bladder surgery    She gets up PMH: Past Medical History:  Diagnosis Date   Allergy    Anemia    as a little gitrl, but not anymore    Arthritis    Back pain    Depression    Diabetes mellitus without complication ([place])    GERD (gastroesophageal reflux disease)    Hyperlipidemia    Hypertension    Hypothyroidism    Thyroid disease     Surgical History: Past Surgical History:  Procedure Laterality Date   BACK SURGERY     CHOLECYSTECTOMY  1979   JOINT REPLACEMENT Bilateral 2017   LUMBAR LAMINECTOMY/ DECOMPRESSION WITH MET-RX N/A 01/22/2020   Procedure: L3-4 laminectomy;  Surgeon: [name], MD;  Location: [facility] ORS;  Service: Neurosurgery;  Laterality: N/A;   REPLACEMENT TOTAL KNEE Bilateral    SPINE SURGERY  2004    Home Medications:  Allergies as of 09/21/2022       Reactions   Penicillins Hives, Swelling        Medication List        Accurate as of September 21, 2022 11:04 AM. If you have any questions, ask your nurse or doctor.          STOP taking these medications    acetaminophen 325 MG tablet Commonly known as: TYLENOL Stopped by: Reece Packer, MD   calcium carbonate 500  MG chewable tablet Commonly known as: TUMS - dosed in mg elemental calcium Stopped by: Reece Packer, MD       TAKE these medications    atorvastatin 40 MG tablet Commonly known as: LIPITOR TAKE 1 TABLET EVERY DAY   carvedilol 6.25 MG tablet Commonly known as: COREG TAKE 1 TABLET EVERY DAY   docusate sodium 50 MG capsule Commonly known as: COLACE Take 1 capsule (50 mg total) by mouth daily.   Dulaglutide 1.5 MG/0.5ML Sopn Inject into the skin. Inject 0.5 mLs (1.5 mg total) subcutaneously every 7 (seven) days   FreeStyle Libre 14 Day Sensor Misc   gabapentin 800 MG tablet Commonly known as: NEURONTIN Take 800 mg by mouth 2 (two) times daily as needed (pain). blackwood   Invokamet XR 150-500 MG Tb24 Generic drug: Canagliflozin-metFORMIN HCl ER Take 1 tablet by mouth in the morning and at bedtime. 2 at breakfast 1 at night   lansoprazole 30 MG capsule Commonly known as: PREVACID TAKE 1 CAPSULE EVERY DAY   levothyroxine 88 MCG tablet Commonly known as: SYNTHROID Take 1 tablet (88 mcg total) by mouth daily before breakfast. Once a day   losartan 25  MG tablet Commonly known as: COZAAR TAKE 1 TABLET (25 MG TOTAL) BY MOUTH DAILY.   meclizine 25 MG tablet Commonly known as: ANTIVERT Take 1 tablet (25 mg total) by mouth 3 (three) times daily as needed for dizziness.   One-A-Day VitaCraves Adult Chew Chew 1 tablet by mouth daily. Patient take gummy   oxybutynin 10 MG 24 hr tablet Commonly known as: Ditropan XL Take 1 tablet (10 mg total) by mouth at bedtime.   sertraline 50 MG tablet Commonly known as: ZOLOFT TAKE 1 TABLET EVERY DAY        Allergies:  Allergies  Allergen Reactions   Penicillins Hives and Swelling    Family History: Family History  Problem Relation Age of Onset   Stroke Mother    Heart disease Father    Diabetes Father    Hypertension Father    Drug abuse Daughter    Early death Brother    Breast cancer Neg Hx     Social  History:  reports that she has never smoked. She has never been exposed to tobacco smoke. She has never used smokeless tobacco. She reports that she does not drink alcohol and does not use drugs.  ROS:                                        Physical Exam: BP (!) 141/84   Pulse 92   Ht 5' (1.524 m)   Wt 72.6 kg   BMI 31.25 kg/m   Constitutional:  Alert and oriented, No acute distress. HEENT: [place] AT, moist mucus membranes.  Trachea midline, no masses.   Laboratory Data: Lab Results  Component Value Date   WBC 5.9 05/04/2022   HGB 10.5 (A) 05/04/2022   HCT 33 (A) 05/04/2022   MCV 88 11/15/2020   PLT 231 05/04/2022    Lab Results  Component Value Date   CREATININE 0.8 05/04/2022    No results found for: "PSA"  No results found for: "TESTOSTERONE"  Lab Results  Component Value Date   HGBA1C 8.5 (H) 01/22/2020    Urinalysis    Component Value Date/Time   BILIRUBINUR neg 06/25/2022 1126   PROTEINUR Positive (A) 06/25/2022 1126   UROBILINOGEN 0.2 06/25/2022 1126   NITRITE neg 06/25/2022 1126   LEUKOCYTESUR Small (1+) (A) 06/25/2022 1126    Pertinent Imaging: Urine reviewed.  Chart reviewed.  Urine sent for culture  Assessment & Plan: Patient has pain at the end of urination.  She had blood in the urine.  I thought was reasonable to give her ciprofloxacin 250 mg twice a day for the next week and have her come back for pelvic examination and cystoscopy.  She will need a CT scan if culture is negative and she still has blood in the urine  1. Lumbar radiculopathy   2. Overactive bladder  - Urinalysis, Complete   No follow-ups on file.  Reece Packer, MD  [facility] [address] [place], [place] [address] [phone]

## 2022-09-24 LAB — CULTURE, URINE COMPREHENSIVE

## 2022-10-05 ENCOUNTER — Ambulatory Visit
Admission: RE | Admit: 2022-10-05 | Discharge: 2022-10-05 | Disposition: A | Discharge status: Home / Self Care | Payer: Medicare PPO | Source: Ambulatory Visit | Attending: Family Medicine | Admitting: Family Medicine

## 2022-10-05 DIAGNOSIS — Z1231 Encounter for screening mammogram for malignant neoplasm of breast: Secondary | ICD-10-CM | POA: Insufficient documentation

## 2022-10-12 ENCOUNTER — Other Ambulatory Visit: Payer: Medicare PPO | Admitting: Urology

## 2022-10-14 DIAGNOSIS — E119 Type 2 diabetes mellitus without complications: Secondary | ICD-10-CM | POA: Diagnosis not present

## 2022-10-25 ENCOUNTER — Other Ambulatory Visit: Payer: Self-pay | Admitting: Family Medicine

## 2022-10-25 DIAGNOSIS — E7801 Familial hypercholesterolemia: Secondary | ICD-10-CM

## 2022-10-25 DIAGNOSIS — I10 Essential (primary) hypertension: Secondary | ICD-10-CM

## 2022-10-25 DIAGNOSIS — K219 Gastro-esophageal reflux disease without esophagitis: Secondary | ICD-10-CM

## 2022-10-25 DIAGNOSIS — R5383 Other fatigue: Secondary | ICD-10-CM

## 2022-10-25 DIAGNOSIS — F324 Major depressive disorder, single episode, in partial remission: Secondary | ICD-10-CM

## 2022-10-26 NOTE — Telephone Encounter (Signed)
Unable to refill per protocol, Rx request is too soon. Last refill 08/19/22 for 90 days. Next refill due in April.  Requested Prescriptions  Pending Prescriptions Disp Refills   carvedilol (COREG) 6.25 MG tablet [Pharmacy Med Name: CARVEDILOL 6.25 MG Tablet] 90 tablet 3    Sig: TAKE 1 TABLET EVERY DAY     Cardiovascular: Beta Blockers 3 Failed - 10/25/2022  6:01 AM      Failed - Last BP in normal range    BP Readings from Last 1 Encounters:  09/21/22 (!) 141/84         Passed - Cr in normal range and within 360 days    Creatinine  Date Value Ref Range Status  05/04/2022 0.8 0.5 - 1.1 Final   Creatinine, Ser  Date Value Ref Range Status  04/16/2022 0.87 0.57 - 1.00 mg/dL Final         Passed - AST in normal range and within 360 days    AST  Date Value Ref Range Status  05/04/2022 27 13 - 35 Final         Passed - ALT in normal range and within 360 days    ALT  Date Value Ref Range Status  05/04/2022 10 7 - 35 U/L Final         Passed - Last Heart Rate in normal range    Pulse Readings from Last 1 Encounters:  09/21/22 92         Passed - Valid encounter within last 6 months    Recent Outpatient Visits           3 months ago Overactive bladder   [place] [facility] [place], Deanna C, MD   4 months ago UTI symptoms   [place] [facility] [place], Deanna C, MD   5 months ago Hypothyroidism, unspecified type   [facility] [place], Deanna C, MD   5 months ago Essential hypertension   [place] [facility] [place], Deanna C, MD   6 months ago Colon cancer screening   [place] [facility], MD               atorvastatin (LIPITOR) 40 MG tablet [Pharmacy Med Name: ATORVASTATIN CALCIUM 40 MG Tablet] 90 tablet 3    Sig: TAKE 1 TABLET EVERY DAY      Cardiovascular:  Antilipid - Statins Failed - 10/25/2022  6:01 AM      Failed - Lipid Panel in normal range within the last 12 months    Cholesterol, Total  Date Value Ref Range Status  05/05/2021 147 100 - 199 mg/dL Final   LDL Chol Calc (NIH)  Date Value Ref Range Status  05/05/2021 83 0 - 99 mg/dL Final   HDL  Date Value Ref Range Status  05/05/2021 39 (L) >39 mg/dL Final   Triglycerides  Date Value Ref Range Status  05/05/2021 140 0 - 149 mg/dL Final         Passed - Patient is not pregnant      Passed - Valid encounter within last 12 months    Recent Outpatient Visits           3 months ago Overactive bladder   [place] [facility] Juline Patch,  MD   4 months ago UTI symptoms   [place] [facility] [place], Deanna C, MD   5 months ago Hypothyroidism, unspecified type   [place] [facility] [place], Deanna C, MD   5 months ago Essential hypertension   [place] [facility] [place], Deanna C, MD   6 months ago Colon cancer screening   [place] [facility], MD               losartan (COZAAR) 25 MG tablet [Pharmacy Med Name: LOSARTAN POTASSIUM 25 MG Tablet] 90 tablet 3    Sig: TAKE 1 TABLET EVERY DAY     Cardiovascular:  Angiotensin Receptor Blockers Failed - 10/25/2022  6:01 AM      Failed - K in normal range and within 180 days    Potassium  Date Value Ref Range Status  04/16/2022 4.6 3.5 - 5.2 mmol/L Final         Failed - Last BP in normal range    BP Readings from Last 1 Encounters:  09/21/22 (!) 141/84         Passed - Cr in normal range and within 180 days    Creatinine  Date Value Ref Range Status  05/04/2022 0.8 0.5 - 1.1 Final   Creatinine, Ser  Date Value Ref Range Status  04/16/2022 0.87 0.57 - 1.00 mg/dL Final          Passed - Patient is not pregnant      Passed - Valid encounter within last 6 months    Recent Outpatient Visits           3 months ago Overactive bladder   [place] [facility] [place], Deanna C, MD   4 months ago UTI symptoms   [facility] [place], Deanna C, MD   5 months ago Hypothyroidism, unspecified type   [facility] [place], Deanna C, MD   5 months ago Essential hypertension   [place] [facility] [place], Deanna C, MD   6 months ago Colon cancer screening   [place] [facility], MD               lansoprazole (PREVACID) 30 MG capsule [Pharmacy Med Name: LANSOPRAZOLE 30 MG Capsule Delayed Release] 90 capsule 3    Sig: TAKE 1 [place]     Gastroenterology: Proton Pump Inhibitors 2 Passed - 10/25/2022  6:01 AM      Passed - ALT in normal range and within 360 days    ALT  Date Value Ref Range Status  05/04/2022 10 7 - 35 U/L Final         Passed - AST in normal range and within 360 days    AST  Date Value Ref Range Status  05/04/2022 27 13 - 35 Final         Passed - Valid encounter within last 12 months    Recent Outpatient Visits           3 months ago Overactive bladder   [place] [facility], MD  4 months ago UTI symptoms   [place] [facility] [place], Deanna C, MD   5 months ago Hypothyroidism, unspecified type   [facility] at [place], Deanna C, MD   5 months ago Essential hypertension   [place] [facility] [employer] [facility], Deanna C, MD   6 months ago Colon cancer screening   [facility], MD               sertraline (ZOLOFT) 50 MG tablet [Pharmacy Med Name: SERTRALINE HCL 50 MG Tablet] 90 tablet 3    Sig: TAKE 1 TABLET EVERY DAY     Psychiatry:  Antidepressants - SSRI - sertraline Passed - 10/25/2022  6:01 AM      Passed - AST in normal range and within 360 days    AST  Date Value Ref Range Status  05/04/2022 27 13 - 35 Final         Passed - ALT in normal range and within 360 days    ALT  Date Value Ref Range Status  05/04/2022 10 7 - 35 U/L Final         Passed - Completed PHQ-2 or PHQ-9 in the last 360 days      Passed - Valid encounter within last 6 months    Recent Outpatient Visits           3 months ago Overactive bladder   [place] [facility] [place], Deanna C, MD   4 months ago UTI symptoms   [facility] at [place], Deanna C, MD   5 months ago Hypothyroidism, unspecified type   [place] at [place], Deanna C, MD   5 months ago Essential hypertension   [place] [facility] [place], Deanna C, MD   6 months ago Colon cancer screening   [place] [facility], MD

## 2022-11-09 ENCOUNTER — Ambulatory Visit: Payer: Medicare PPO | Admitting: Urology

## 2022-11-09 ENCOUNTER — Encounter: Payer: Self-pay | Admitting: Urology

## 2022-11-09 VITALS — BP 128/65 | HR 84 | Ht 60.0 in | Wt 160.0 lb

## 2022-11-09 DIAGNOSIS — N8110 Cystocele, unspecified: Secondary | ICD-10-CM | POA: Diagnosis not present

## 2022-11-09 DIAGNOSIS — R350 Frequency of micturition: Secondary | ICD-10-CM | POA: Diagnosis not present

## 2022-11-09 DIAGNOSIS — R309 Painful micturition, unspecified: Secondary | ICD-10-CM

## 2022-11-09 DIAGNOSIS — N3281 Overactive bladder: Secondary | ICD-10-CM | POA: Diagnosis not present

## 2022-11-09 LAB — URINALYSIS, COMPLETE
Bilirubin, UA: NEGATIVE
Ketones, UA: NEGATIVE
Leukocytes,UA: NEGATIVE
Nitrite, UA: NEGATIVE
Protein,UA: NEGATIVE
RBC, UA: NEGATIVE
Specific Gravity, UA: 1.015 (ref 1.005–1.030)
Urobilinogen, Ur: 0.2 mg/dL (ref 0.2–1.0)
pH, UA: 5 (ref 5.0–7.5)

## 2022-11-09 LAB — MICROSCOPIC EXAMINATION

## 2022-11-09 MED ORDER — MIRABEGRON ER 50 MG PO TB24: 50.0000 mg | ORAL_TABLET | Freq: Every day | ORAL | 0 refills | Status: DC

## 2022-11-09 NOTE — Progress Notes (Signed)
11/09/2022 2:50 PM   Sara Carr 1945-11-26 [id number]  Referring provider: Juline Patch, MD [address] [place],  [place] [address]  No chief complaint on file.   HPI: I was consulted to assess the patient who complains of pain at the end of urination that last for many seconds.  Is been present for approximately 3 months.  She does not appear to get pain otherwise.  She voids about 6-7 times a day and gets up 3 times a night.  She has never smoked.  She is on oxybutynin but is not helping   She is continent.  She has not had a hysterectomy.  She has had a stroke and is on oral hypoglycemics   She had a urine culture June 25, 2022 that was normal    Patient has pain at the end of urination. She had blood in the urine. I thought was reasonable to give her ciprofloxacin 250 mg twice a day for the next week and have her come back for pelvic examination and cystoscopy. She will need a CT scan if culture is negative and she still has blood in the urine   Today Frequency stable.  Last culture was positive.  Patient still going frequently Patient said that burning went away since he started the antibiotics On pelvic examination minimal prolapse no stress incontinence Cystoscopy: Patient underwent flexible cystoscopy.  Bladder mucosa and trigone were normal.  No carcinoma.  No cystitis.  Urine clear     PMH: Past Medical History:  Diagnosis Date   Allergy    Anemia    as a little gitrl, but not anymore    Arthritis    Back pain    Depression    Diabetes mellitus without complication ([place])    GERD (gastroesophageal reflux disease)    Hyperlipidemia    Hypertension    Hypothyroidism    Thyroid disease     Surgical History: Past Surgical History:  Procedure Laterality Date   BACK SURGERY     CHOLECYSTECTOMY  1979   JOINT REPLACEMENT Bilateral 2017   LUMBAR LAMINECTOMY/ DECOMPRESSION WITH MET-RX N/A 01/22/2020   Procedure: L3-4 laminectomy;  Surgeon:  [name], MD;  Location: [facility] ORS;  Service: Neurosurgery;  Laterality: N/A;   REPLACEMENT TOTAL KNEE Bilateral    SPINE SURGERY  2004    Home Medications:  Allergies as of 11/09/2022       Reactions   Penicillins Hives, Swelling        Medication List        Accurate as of November 09, 2022  2:50 PM. If you have any questions, ask your nurse or doctor.          atorvastatin 40 MG tablet Commonly known as: LIPITOR TAKE 1 TABLET EVERY DAY   carvedilol 6.25 MG tablet Commonly known as: COREG TAKE 1 TABLET EVERY DAY   docusate sodium 50 MG capsule Commonly known as: COLACE Take 1 capsule (50 mg total) by mouth daily.   Dulaglutide 1.5 MG/0.5ML Sopn Inject into the skin. Inject 0.5 mLs (1.5 mg total) subcutaneously every 7 (seven) days   FreeStyle Libre 14 Day Sensor Misc   gabapentin 800 MG tablet Commonly known as: NEURONTIN Take 800 mg by mouth 2 (two) times daily as needed (pain). [place]   Invokamet XR 150-500 MG Tb24 Generic drug: Canagliflozin-metFORMIN HCl ER Take 1 tablet by mouth in the morning and at bedtime. 2 at breakfast 1 at night   lansoprazole 30  MG capsule Commonly known as: PREVACID TAKE 1 CAPSULE EVERY DAY   levothyroxine 88 MCG tablet Commonly known as: SYNTHROID Take 1 tablet (88 mcg total) by mouth daily before breakfast. Once a day   losartan 25 MG tablet Commonly known as: COZAAR TAKE 1 TABLET (25 MG TOTAL) BY MOUTH DAILY.   meclizine 25 MG tablet Commonly known as: ANTIVERT Take 1 tablet (25 mg total) by mouth 3 (three) times daily as needed for dizziness.   One-A-Day VitaCraves Adult Chew Chew 1 tablet by mouth daily. Patient take gummy   oxybutynin 10 MG 24 hr tablet Commonly known as: Ditropan XL Take 1 tablet (10 mg total) by mouth at bedtime.   sertraline 50 MG tablet Commonly known as: ZOLOFT TAKE 1 TABLET EVERY DAY        Allergies:  Allergies  Allergen Reactions   Penicillins Hives and Swelling     Family History: Family History  Problem Relation Age of Onset   Stroke Mother    Heart disease Father    Diabetes Father    Hypertension Father    Drug abuse Daughter    Early death Brother    Breast cancer Neg Hx     Social History:  reports that she has never smoked. She has never been exposed to tobacco smoke. She has never used smokeless tobacco. She reports that she does not drink alcohol and does not use drugs.  ROS:                                        Physical Exam: There were no vitals taken for this visit.  Constitutional:  Alert and oriented, No acute distress. HEENT: [place] AT, moist mucus membranes.  Trachea midline, no masses. Cardiovascular: No clubbing, cyanosis, or edema.  Laboratory Data: Lab Results  Component Value Date   WBC 5.9 05/04/2022   HGB 10.5 (A) 05/04/2022   HCT 33 (A) 05/04/2022   MCV 88 11/15/2020   PLT 231 05/04/2022    Lab Results  Component Value Date   CREATININE 0.8 05/04/2022    No results found for: "PSA"  No results found for: "TESTOSTERONE"  Lab Results  Component Value Date   HGBA1C 8.5 (H) 01/22/2020    Urinalysis    Component Value Date/Time   APPEARANCEUR Cloudy (A) 09/21/2022 1055   GLUCOSEU 3+ (A) 09/21/2022 1055   BILIRUBINUR Negative 09/21/2022 1055   PROTEINUR 1+ (A) 09/21/2022 1055   UROBILINOGEN 0.2 06/25/2022 1126   NITRITE Negative 09/21/2022 1055   LEUKOCYTESUR 2+ (A) 09/21/2022 1055    Pertinent Imaging:   Assessment & Plan: Stop oxybutynin.  Reassess 6 weeks on Myrbetriq 50 mg samples and prescription.  Call if culture positive.  I am not can to start suppressive antibiotics at this stage  There are no diagnoses linked to this encounter.  No follow-ups on file.  Reece Packer, MD  [facility] [address] [place], [place] [address] [phone]

## 2022-11-11 LAB — CULTURE, URINE COMPREHENSIVE

## 2022-11-17 DIAGNOSIS — E1142 Type 2 diabetes mellitus with diabetic polyneuropathy: Secondary | ICD-10-CM | POA: Diagnosis not present

## 2022-11-23 DIAGNOSIS — Z96651 Presence of right artificial knee joint: Secondary | ICD-10-CM | POA: Diagnosis not present

## 2022-11-23 DIAGNOSIS — M25562 Pain in left knee: Secondary | ICD-10-CM | POA: Diagnosis not present

## 2022-11-23 DIAGNOSIS — E1169 Type 2 diabetes mellitus with other specified complication: Secondary | ICD-10-CM | POA: Diagnosis not present

## 2022-11-23 DIAGNOSIS — M25561 Pain in right knee: Secondary | ICD-10-CM | POA: Diagnosis not present

## 2022-11-23 DIAGNOSIS — Z6831 Body mass index (BMI) 31.0-31.9, adult: Secondary | ICD-10-CM | POA: Diagnosis not present

## 2022-11-23 DIAGNOSIS — G8929 Other chronic pain: Secondary | ICD-10-CM | POA: Diagnosis not present

## 2022-11-23 DIAGNOSIS — Z96652 Presence of left artificial knee joint: Secondary | ICD-10-CM | POA: Diagnosis not present

## 2022-12-29 DIAGNOSIS — E1069 Type 1 diabetes mellitus with other specified complication: Secondary | ICD-10-CM | POA: Diagnosis not present

## 2022-12-29 DIAGNOSIS — R413 Other amnesia: Secondary | ICD-10-CM | POA: Diagnosis not present

## 2022-12-29 DIAGNOSIS — G459 Transient cerebral ischemic attack, unspecified: Secondary | ICD-10-CM | POA: Diagnosis not present

## 2022-12-29 DIAGNOSIS — G479 Sleep disorder, unspecified: Secondary | ICD-10-CM | POA: Diagnosis not present

## 2022-12-29 DIAGNOSIS — G8929 Other chronic pain: Secondary | ICD-10-CM | POA: Diagnosis not present

## 2022-12-29 DIAGNOSIS — M545 Low back pain, unspecified: Secondary | ICD-10-CM | POA: Diagnosis not present

## 2023-01-01 ENCOUNTER — Other Ambulatory Visit: Payer: Self-pay | Admitting: Family Medicine

## 2023-01-01 DIAGNOSIS — I10 Essential (primary) hypertension: Secondary | ICD-10-CM

## 2023-01-01 DIAGNOSIS — E7801 Familial hypercholesterolemia: Secondary | ICD-10-CM

## 2023-01-01 DIAGNOSIS — F324 Major depressive disorder, single episode, in partial remission: Secondary | ICD-10-CM

## 2023-01-01 DIAGNOSIS — K219 Gastro-esophageal reflux disease without esophagitis: Secondary | ICD-10-CM

## 2023-01-01 DIAGNOSIS — R5383 Other fatigue: Secondary | ICD-10-CM

## 2023-01-04 ENCOUNTER — Ambulatory Visit: Payer: Medicare PPO | Admitting: Urology

## 2023-01-04 VITALS — BP 114/96 | HR 94 | Ht 60.0 in | Wt 144.0 lb

## 2023-01-04 DIAGNOSIS — E1169 Type 2 diabetes mellitus with other specified complication: Secondary | ICD-10-CM | POA: Diagnosis not present

## 2023-01-04 DIAGNOSIS — E785 Hyperlipidemia, unspecified: Secondary | ICD-10-CM | POA: Diagnosis not present

## 2023-01-04 DIAGNOSIS — N3281 Overactive bladder: Secondary | ICD-10-CM

## 2023-01-04 DIAGNOSIS — E063 Autoimmune thyroiditis: Secondary | ICD-10-CM | POA: Diagnosis not present

## 2023-01-04 DIAGNOSIS — E1165 Type 2 diabetes mellitus with hyperglycemia: Secondary | ICD-10-CM | POA: Diagnosis not present

## 2023-01-04 DIAGNOSIS — E1159 Type 2 diabetes mellitus with other circulatory complications: Secondary | ICD-10-CM | POA: Diagnosis not present

## 2023-01-04 DIAGNOSIS — I152 Hypertension secondary to endocrine disorders: Secondary | ICD-10-CM | POA: Diagnosis not present

## 2023-01-04 DIAGNOSIS — E782 Mixed hyperlipidemia: Secondary | ICD-10-CM | POA: Diagnosis not present

## 2023-01-04 LAB — HEMOGLOBIN A1C: Hemoglobin A1C: 6.9

## 2023-01-04 LAB — HM DIABETES FOOT EXAM: HM Diabetic Foot Exam: NORMAL

## 2023-01-04 MED ORDER — MIRABEGRON ER 50 MG PO TB24: 50.0000 mg | ORAL_TABLET | Freq: Every day | ORAL | 3 refills | Status: DC

## 2023-01-04 NOTE — Addendum Note (Signed)
Addended by: Consuella Lose on: 01/04/2023 04:10 PM   Modules accepted: Orders

## 2023-01-04 NOTE — Progress Notes (Signed)
01/04/2023 2:27 PM   Sara Carr 1946/08/08 [id number]  Referring provider: Duanne Limerick, MD [address] [place],  [place] [address]  Chief Complaint  Patient presents with   Follow-up    HPI: I was consulted to assess the patient who complains of pain at the end of urination that last for many seconds.  Is been present for approximately 3 months.  She does not appear to get pain otherwise.  She voids about 6-7 times a day and gets up 3 times a night.  She has never smoked.  She is on oxybutynin but is not helping   She is continent.  She has not had a hysterectomy.  She has had a stroke and is on oral hypoglycemics   She had a urine culture June 25, 2022 that was normal     Patient has pain at the end of urination. She had blood in the urine. I thought was reasonable to give her ciprofloxacin 250 mg twice a day for the next week and have her come back for pelvic examination and cystoscopy. She will need a CT scan if culture is negative and she still has blood in the urine     Last culture was positive.  Patient still going frequently Patient said that burning went away since she started the antibiotics On pelvic examination minimal prolapse no stress incontinence Cystoscopy: normal  Stop oxybutynin. Reassess 6 weeks on Myrbetriq 50 mg samples and prescription. Call if culture positive. I am not can to start suppressive antibiotics at this stage   Today Last culture negative No more burning.  Very pleased with much less frequency on Myrbetriq.   PMH: Past Medical History:  Diagnosis Date   Allergy    Anemia    as a little gitrl, but not anymore    Arthritis    Back pain    Depression    Diabetes mellitus without complication ([facility])    GERD (gastroesophageal reflux disease)    Hyperlipidemia    Hypertension    Hypothyroidism    Thyroid disease     Surgical History: Past Surgical History:  Procedure Laterality Date   BACK SURGERY      CHOLECYSTECTOMY  1979   JOINT REPLACEMENT Bilateral 2017   LUMBAR LAMINECTOMY/ DECOMPRESSION WITH MET-RX N/A 01/22/2020   Procedure: L3-4 laminectomy;  Surgeon: Lucy Chris, MD;  Location: [facility] ORS;  Service: Neurosurgery;  Laterality: N/A;   REPLACEMENT TOTAL KNEE Bilateral    SPINE SURGERY  2004    Home Medications:  Allergies as of 01/04/2023       Reactions   Penicillins Hives, Swelling        Medication List        Accurate as of Jan 04, 2023  2:27 PM. If you have any questions, ask your nurse or doctor.          atorvastatin 40 MG tablet Commonly known as: LIPITOR TAKE 1 TABLET EVERY DAY   carvedilol 6.25 MG tablet Commonly known as: COREG TAKE 1 TABLET EVERY DAY   docusate sodium 50 MG capsule Commonly known as: COLACE Take 1 capsule (50 mg total) by mouth daily.   Dulaglutide 1.5 MG/0.5ML Sopn Inject into the skin. Inject 0.5 mLs (1.5 mg total) subcutaneously every 7 (seven) days   FreeStyle Libre 2 Reader Hardie Pulley USE TO MONITOR BLOOD GLUCOSE.   gabapentin 800 MG tablet Commonly known as: NEURONTIN Take 800 mg by mouth 2 (two) times daily as needed (pain). [place]  Invokamet XR 150-500 MG Tb24 Generic drug: Canagliflozin-metFORMIN HCl ER Take 1 tablet by mouth in the morning and at bedtime. 2 at breakfast 1 at night   lansoprazole 30 MG capsule Commonly known as: PREVACID TAKE 1 CAPSULE EVERY DAY   levothyroxine 88 MCG tablet Commonly known as: SYNTHROID Take 1 tablet (88 mcg total) by mouth daily before breakfast. Once a day   losartan 25 MG tablet Commonly known as: COZAAR TAKE 1 TABLET (25 MG TOTAL) BY MOUTH DAILY.   meclizine 25 MG tablet Commonly known as: ANTIVERT Take 1 tablet (25 mg total) by mouth 3 (three) times daily as needed for dizziness.   mirabegron ER 50 MG Tb24 tablet Commonly known as: MYRBETRIQ Take 1 tablet (50 mg total) by mouth daily.   One-A-Day VitaCraves Adult Chew Chew 1 tablet by mouth daily. Patient take  gummy   sertraline 50 MG tablet Commonly known as: ZOLOFT TAKE 1 TABLET EVERY DAY        Allergies:  Allergies  Allergen Reactions   Penicillins Hives and Swelling    Family History: Family History  Problem Relation Age of Onset   Stroke Mother    Heart disease Father    Diabetes Father    Hypertension Father    Drug abuse Daughter    Early death Brother    Breast cancer Neg Hx     Social History:  reports that she has never smoked. She has never been exposed to tobacco smoke. She has never used smokeless tobacco. She reports that she does not drink alcohol and does not use drugs.  ROS:                                        Physical Exam: \ Laboratory Data: Lab Results  Component Value Date   WBC 5.9 05/04/2022   HGB 10.5 (A) 05/04/2022   HCT 33 (A) 05/04/2022   MCV 88 11/15/2020   PLT 231 05/04/2022    Lab Results  Component Value Date   CREATININE 0.8 05/04/2022    No results found for: "PSA"  No results found for: "TESTOSTERONE"  Lab Results  Component Value Date   HGBA1C 8.5 (H) 01/22/2020    Urinalysis    Component Value Date/Time   APPEARANCEUR Clear 11/09/2022 1505   GLUCOSEU 3+ (A) 11/09/2022 1505   BILIRUBINUR Negative 11/09/2022 1505   PROTEINUR Negative 11/09/2022 1505   UROBILINOGEN 0.2 06/25/2022 1126   NITRITE Negative 11/09/2022 1505   LEUKOCYTESUR Negative 11/09/2022 1505    Pertinent Imaging:   Assessment & Plan: Prescription 90 x 3 Myrbetriq sent to pharmacy and I will see in 1 year  1. Overactive bladder  - Urinalysis, Complete   No follow-ups on file.  Martina Sinner, MD  [facility] [address] [place], [place] [address] [phone]

## 2023-01-29 ENCOUNTER — Other Ambulatory Visit: Payer: Self-pay

## 2023-01-29 ENCOUNTER — Telehealth: Payer: Self-pay

## 2023-01-29 ENCOUNTER — Telehealth: Payer: Self-pay | Admitting: Family Medicine

## 2023-01-29 DIAGNOSIS — T753XXA Motion sickness, initial encounter: Secondary | ICD-10-CM

## 2023-01-29 MED ORDER — SCOPOLAMINE 1 MG/3DAYS TD PT72
1.0000 | MEDICATED_PATCH | TRANSDERMAL | 0 refills | Status: DC
Start: 2023-01-29 — End: 2024-05-15

## 2023-01-29 NOTE — Telephone Encounter (Signed)
Copied from CRM [id number]. Topic: General - Other >> Jan 29, 2023 12:30 PM Franchot Heidelberg wrote: Reason for CRM: Pt called requesting a patch prescription that alleviates sea sickness, please advise. Pt has questions because she leaves for a cruise soon.   Best contact: [phone]

## 2023-01-29 NOTE — Telephone Encounter (Signed)
Sent in scop. Patches # 10 for pt to go on cruise x 14 days to [facility]

## 2023-03-18 ENCOUNTER — Other Ambulatory Visit: Payer: Self-pay | Admitting: Family Medicine

## 2023-03-18 DIAGNOSIS — F324 Major depressive disorder, single episode, in partial remission: Secondary | ICD-10-CM

## 2023-03-18 DIAGNOSIS — I10 Essential (primary) hypertension: Secondary | ICD-10-CM

## 2023-03-18 DIAGNOSIS — K219 Gastro-esophageal reflux disease without esophagitis: Secondary | ICD-10-CM

## 2023-03-18 DIAGNOSIS — R5383 Other fatigue: Secondary | ICD-10-CM

## 2023-03-18 DIAGNOSIS — E7801 Familial hypercholesterolemia: Secondary | ICD-10-CM

## 2023-03-18 NOTE — Telephone Encounter (Signed)
Requested medication (s) are due for refill today:yes  Requested medication (s) are on the active medication list: yes  Last refill:  all 5 01/01/23 #90  Future visit scheduled: no  Notes to clinic:  called pt to make appt and pt stated she will call back after she talks to husband for transportation/overdue lab work    Requested Prescriptions  Pending Prescriptions Disp Refills   sertraline (ZOLOFT) 50 MG tablet [Pharmacy Med Name: SERTRALINE HCL 50 MG Tablet] 90 tablet 3    Sig: TAKE 1 TABLET EVERY DAY (NEED MD APPOINTMENT)     Psychiatry:  Antidepressants - SSRI - sertraline Failed - 03/18/2023  2:27 AM      Failed - Valid encounter within last 6 months    Recent Outpatient Visits           7 months ago Overactive bladder   [place] [facility]   8 months ago UTI symptoms   [place] [facility]   9 months ago Hypothyroidism, unspecified type   [facility]   10 months ago Essential hypertension   [place] [facility], [facility]   11 months ago Colon cancer screening   [place] [facility]       Future Appointments             In 9 months MacDiarmid, Lorin Picket, MD [facility] [place]            Passed - AST in normal range and within 360 days    AST  Date Value Ref Range Status  05/04/2022 27 13 - 35 Final         Passed - ALT in normal range and within 360 days    ALT  Date Value Ref Range Status  05/04/2022 10 7 - 35 U/L Final         Passed - Completed PHQ-2 or PHQ-9 in the last 360 days       atorvastatin (LIPITOR) 40 MG tablet [Pharmacy Med Name: ATORVASTATIN CALCIUM 40 MG Tablet] 90 tablet 3    Sig: TAKE 1 TABLET EVERY DAY (NEED MD APPOINTMENT)      Cardiovascular:  Antilipid - Statins Failed - 03/18/2023  2:27 AM      Failed - Lipid Panel in normal range within the last 12 months    Cholesterol, Total  Date Value Ref Range Status  05/05/2021 147 100 - 199 mg/dL Final   LDL Chol Calc (NIH)  Date Value Ref Range Status  05/05/2021 83 0 - 99 mg/dL Final   HDL  Date Value Ref Range Status  05/05/2021 39 (L) >39 mg/dL Final   Triglycerides  Date Value Ref Range Status  05/05/2021 140 0 - 149 mg/dL Final         Passed - Patient is not pregnant      Passed - Valid encounter within last 12 months    Recent Outpatient Visits           7 months ago Overactive bladder   [place] [facility]   8 months ago UTI symptoms   [facility] at [facility], MD   9 months ago Hypothyroidism, unspecified type   [facility]   10 months ago Essential hypertension   [place] [facility]   11 months ago Colon cancer screening   [facility]       Future Appointments             In 9 months MacDiarmid, Lorin Picket, MD [facility] [place]             carvedilol (COREG) 6.25 MG tablet [Pharmacy Med Name: CARVEDILOL 6.25 MG Tablet] 90 tablet 3    Sig: TAKE 1 TABLET EVERY DAY (NEED MD APPOINTMENT)     Cardiovascular: Beta Blockers 3 Failed - 03/18/2023  2:27 AM      Failed - Last BP in normal range    BP Readings from Last 1 Encounters:  01/04/23 (!) 114/96         Failed - Valid encounter within last 6 months    Recent Outpatient Visits           7 months ago Overactive bladder   [place] [facility]   8 months ago UTI symptoms   [facility]   9 months ago Hypothyroidism, unspecified type   [facility]   10 months ago Essential hypertension   [place] [facility] MD   11 months ago Colon cancer screening   [facility] MD       Future Appointments             In 9 months MacDiarmid, Lorin Picket, MD [facility] [place]            Passed - Cr in normal range and within 360 days    Creatinine  Date Value Ref Range Status  05/04/2022 0.8 0.5 - 1.1 Final   Creatinine, Ser  Date Value Ref Range Status  04/16/2022 0.87 0.57 - 1.00 mg/dL Final         Passed - AST in normal range and within 360 days    AST  Date Value Ref Range Status  05/04/2022 27 13 - 35 Final         Passed - ALT in normal range and within 360 days    ALT  Date Value Ref Range Status  05/04/2022 10 7 - 35 U/L Final         Passed - Last Heart Rate in normal range    Pulse Readings from Last 1 Encounters:  01/04/23 94          lansoprazole (PREVACID) 30 MG capsule [Pharmacy Med Name: LANSOPRAZOLE 30 MG Capsule Delayed Release] 90 capsule 3    Sig: TAKE 1 CAPSULE EVERY DAY (NEED MD APPOINTMENT)     Gastroenterology: Proton Pump Inhibitors 2 Passed - 03/18/2023  2:27 AM      Passed - ALT in normal range and within 360 days    ALT  Date Value Ref Range Status  05/04/2022  10 7 - 35 U/L Final         Passed - AST in normal range and within 360 days    AST  Date Value Ref Range Status  05/04/2022 27 13 - 35 Final         Passed - Valid encounter within last 12 months    Recent Outpatient Visits           7 months ago Overactive bladder   [place] [facility]   8 months ago UTI symptoms   [place]  [facility] MD   9 months ago Hypothyroidism, unspecified type   [place] [facility] MD   10 months ago Essential hypertension   [place] [facility] MD   11 months ago Colon cancer screening   [place] [facility] MD       Future Appointments             In 9 months MacDiarmid, Lorin Picket, MD [facility] [place]             losartan (COZAAR) 25 MG tablet [Pharmacy Med Name: LOSARTAN POTASSIUM 25 MG Tablet] 90 tablet 3    Sig: TAKE 1 TABLET EVERY DAY (NEED MD APPOINTMENT)     Cardiovascular:  Angiotensin Receptor Blockers Failed - 03/18/2023  2:27 AM      Failed - Cr in normal range and within 180 days    Creatinine  Date Value Ref Range Status  05/04/2022 0.8 0.5 - 1.1 Final   Creatinine, Ser  Date Value Ref Range Status  04/16/2022 0.87 0.57 - 1.00 mg/dL Final         Failed - K in normal range and within 180 days    Potassium  Date Value Ref Range Status  04/16/2022 4.6 3.5 - 5.2 mmol/L Final         Failed - Last BP in normal range    BP Readings from Last 1 Encounters:  01/04/23 (!) 114/96         Failed - Valid encounter within last 6 months    Recent Outpatient Visits           7 months ago Overactive bladder   [place] [facility]   8 months ago UTI symptoms   [facility]   9 months ago Hypothyroidism, unspecified type   [facility] MD   10 months ago Essential hypertension   [place] [facility] MD   11 months ago Colon cancer screening   [facility]  Care & [facility] MD       Future Appointments             In 9 months MacDiarmid, Lorin Picket, MD [facility]rology [facility] - Patient is not pregnant

## 2023-03-30 DIAGNOSIS — E1142 Type 2 diabetes mellitus with diabetic polyneuropathy: Secondary | ICD-10-CM | POA: Diagnosis not present

## 2023-03-30 DIAGNOSIS — L603 Nail dystrophy: Secondary | ICD-10-CM | POA: Diagnosis not present

## 2023-04-06 ENCOUNTER — Ambulatory Visit (INDEPENDENT_AMBULATORY_CARE_PROVIDER_SITE_OTHER): Payer: Medicare PPO | Admitting: Family Medicine

## 2023-04-06 ENCOUNTER — Encounter: Payer: Self-pay | Admitting: Family Medicine

## 2023-04-06 VITALS — BP 110/74 | HR 69 | Ht 60.0 in | Wt 144.0 lb

## 2023-04-06 DIAGNOSIS — E039 Hypothyroidism, unspecified: Secondary | ICD-10-CM

## 2023-04-06 DIAGNOSIS — K219 Gastro-esophageal reflux disease without esophagitis: Secondary | ICD-10-CM | POA: Diagnosis not present

## 2023-04-06 DIAGNOSIS — F324 Major depressive disorder, single episode, in partial remission: Secondary | ICD-10-CM

## 2023-04-06 DIAGNOSIS — I10 Essential (primary) hypertension: Secondary | ICD-10-CM

## 2023-04-06 DIAGNOSIS — R5383 Other fatigue: Secondary | ICD-10-CM

## 2023-04-06 DIAGNOSIS — E78019 Familial hypercholesterolemia, unspecified: Secondary | ICD-10-CM

## 2023-04-06 DIAGNOSIS — E7801 Familial hypercholesterolemia: Secondary | ICD-10-CM | POA: Diagnosis not present

## 2023-04-06 MED ORDER — LOSARTAN POTASSIUM 25 MG PO TABS
25.0000 mg | ORAL_TABLET | Freq: Every day | ORAL | 1 refills | Status: DC
Start: 2023-04-06 — End: 2023-05-31

## 2023-04-06 MED ORDER — SERTRALINE HCL 50 MG PO TABS
50.0000 mg | ORAL_TABLET | Freq: Every day | ORAL | 1 refills | Status: DC
Start: 2023-04-06 — End: 2023-05-31

## 2023-04-06 MED ORDER — LANSOPRAZOLE 30 MG PO CPDR
DELAYED_RELEASE_CAPSULE | ORAL | 1 refills | Status: DC
Start: 2023-04-06 — End: 2023-10-07

## 2023-04-06 MED ORDER — CARVEDILOL 6.25 MG PO TABS
6.2500 mg | ORAL_TABLET | Freq: Every day | ORAL | 1 refills | Status: DC
Start: 2023-04-06 — End: 2023-05-31

## 2023-04-06 MED ORDER — ATORVASTATIN CALCIUM 40 MG PO TABS
40.0000 mg | ORAL_TABLET | Freq: Every day | ORAL | 1 refills | Status: DC
Start: 2023-04-06 — End: 2023-05-31

## 2023-04-06 NOTE — Progress Notes (Signed)
Date:  04/06/2023   Name:  Sara Carr   DOB:  1945-12-07   MRN:  [id number]   Chief Complaint: Hyperlipidemia, Hypertension, Gastroesophageal Reflux, and Depression  Hyperlipidemia This is a chronic problem. The current episode started more than 1 year ago. The problem is controlled. Recent lipid tests were reviewed and are normal. She has no history of chronic renal disease, diabetes, hypothyroidism, liver disease, obesity or nephrotic syndrome. There are no known factors aggravating her hyperlipidemia. Pertinent negatives include no chest pain, focal sensory loss, focal weakness, leg pain, myalgias or shortness of breath. Current antihyperlipidemic treatment includes exercise, diet change and statins. The current treatment provides moderate improvement of lipids. There are no compliance problems.  Risk factors for coronary artery disease include hypertension and dyslipidemia.  Hypertension The current episode started more than 1 year ago. The problem has been gradually improving since onset. The problem is controlled. Pertinent negatives include no anxiety, blurred vision, chest pain, headaches, palpitations, peripheral edema, PND, shortness of breath or sweats. There are no associated agents to hypertension. Risk factors for coronary artery disease include dyslipidemia and smoking/tobacco exposure. Past treatments include beta blockers, alpha 1 blockers and angiotensin blockers. The current treatment provides moderate improvement. There are no compliance problems.  There is no history of CAD/MI, CVA or PVD. Identifiable causes of hypertension include a thyroid problem. There is no history of chronic renal disease or a hypertension causing med.  Gastroesophageal Reflux She reports no abdominal pain, no chest pain, no choking, no coughing, no dysphagia, no heartburn, no nausea, no sore throat or no wheezing. This is a chronic problem. The current episode started more than 1 year ago. The problem  occurs rarely. The problem has been gradually improving. Pertinent negatives include no anemia, fatigue, melena, muscle weakness, orthopnea or weight loss. She has tried a PPI for the symptoms. The treatment provided moderate relief.  Depression        This is a chronic problem.  The current episode started more than 1 year ago.   The problem occurs every several days.  The problem has been gradually improving since onset.  Associated symptoms include no decreased concentration, no fatigue, no helplessness, no hopelessness, does not have insomnia, not irritable, no restlessness, no decreased interest, no appetite change, no body aches, no myalgias, no headaches, no indigestion, not sad and no suicidal ideas.     The symptoms are aggravated by nothing.  Past treatments include SSRIs - Selective serotonin reuptake inhibitors.  Past medical history includes thyroid problem.     Pertinent negatives include no hypothyroidism and no anxiety. Thyroid Problem Presents for follow-up visit. Symptoms include cold intolerance. Patient reports no depressed mood, dry skin, fatigue, hair loss, heat intolerance, nail problem, palpitations, weight gain or weight loss. Her past medical history is significant for hyperlipidemia. There is no history of diabetes.    Lab Results  Component Value Date   NA 137 05/04/2022   K 4.6 04/16/2022   CO2 23 (A) 05/04/2022   GLUCOSE 148 (H) 04/16/2022   BUN 10 05/04/2022   CREATININE 0.8 05/04/2022   CALCIUM 8.2 (A) 05/04/2022   EGFR 76 05/04/2022   GFRNONAA 54 (L) 11/10/2021   Lab Results  Component Value Date   CHOL 147 05/05/2021   HDL 39 (L) 05/05/2021   LDLCALC 83 05/05/2021   TRIG 140 05/05/2021   Lab Results  Component Value Date   TSH 0.166 (L) 05/25/2022   Lab Results  Component Value Date  HGBA1C 6.9 01/04/2023   Lab Results  Component Value Date   WBC 5.9 05/04/2022   HGB 10.5 (A) 05/04/2022   HCT 33 (A) 05/04/2022   MCV 88 11/15/2020   PLT 231  05/04/2022   Lab Results  Component Value Date   ALT 10 05/04/2022   AST 27 05/04/2022   ALKPHOS 57 05/04/2022   No results found for: "25OHVITD2", "25OHVITD3", "VD25OH"   Review of Systems  Constitutional:  Negative for appetite change, fatigue, weight gain and weight loss.  HENT:  Negative for postnasal drip and sore throat.   Eyes:  Negative for blurred vision and visual disturbance.  Respiratory:  Negative for cough, choking, shortness of breath and wheezing.   Cardiovascular:  Negative for chest pain, palpitations and PND.  Gastrointestinal:  Negative for abdominal pain, dysphagia, heartburn, melena and nausea.  Endocrine: Positive for cold intolerance. Negative for heat intolerance, polydipsia and polyuria.  Musculoskeletal:  Negative for myalgias and muscle weakness.  Neurological:  Negative for dizziness, focal weakness, light-headedness, numbness and headaches.  Hematological:  Negative for adenopathy. Does not bruise/bleed easily.  Psychiatric/Behavioral:  Positive for depression. Negative for decreased concentration and suicidal ideas. The patient does not have insomnia.     Patient Active Problem List   Diagnosis Date Noted   Lumbar radiculopathy 01/22/2020    Allergies  Allergen Reactions   Penicillins Hives and Swelling    Past Surgical History:  Procedure Laterality Date   BACK SURGERY     CHOLECYSTECTOMY  1979   JOINT REPLACEMENT Bilateral 2017   LUMBAR LAMINECTOMY/ DECOMPRESSION WITH MET-RX N/A 01/22/2020   Procedure: L3-4 laminectomy;  Surgeon: Lucy Chris, MD;  Location: [facility] ORS;  Service: Neurosurgery;  Laterality: N/A;   REPLACEMENT TOTAL KNEE Bilateral    SPINE SURGERY  2004    Social History   Tobacco Use   Smoking status: Never    Passive exposure: Never   Smokeless tobacco: Never   Tobacco comments:    none  Vaping Use   Vaping status: Never Used  Substance Use Topics   Alcohol use: Never   Drug use: Never     Medication list has  been reviewed and updated.  Current Meds  Medication Sig   atorvastatin (LIPITOR) 40 MG tablet TAKE 1 TABLET EVERY DAY (NEED MD APPOINTMENT)   carvedilol (COREG) 6.25 MG tablet TAKE 1 TABLET EVERY DAY (NEED MD APPOINTMENT)   Continuous Blood Gluc Receiver (FREESTYLE LIBRE 2 READER) DEVI USE TO MONITOR BLOOD GLUCOSE.   docusate sodium (COLACE) 50 MG capsule Take 1 capsule (50 mg total) by mouth daily.   Dulaglutide 1.5 MG/0.5ML SOPN Inject into the skin. Inject 0.5 mLs (1.5 mg total) subcutaneously every 7 (seven) days   gabapentin (NEURONTIN) 800 MG tablet Take 800 mg by mouth 2 (two) times daily as needed (pain). [place]   INVOKAMET XR 150-500 MG TB24 Take 1 tablet by mouth in the morning and at bedtime. 2 at breakfast 1 at night   lansoprazole (PREVACID) 30 MG capsule TAKE 1 CAPSULE EVERY DAY (NEED MD APPOINTMENT)   levothyroxine (SYNTHROID) 88 MCG tablet Take 1 tablet (88 mcg total) by mouth daily before breakfast. Once a day   losartan (COZAAR) 25 MG tablet TAKE 1 TABLET EVERY DAY (NEED MD APPOINTMENT)   meclizine (ANTIVERT) 25 MG tablet Take 1 tablet (25 mg total) by mouth 3 (three) times daily as needed for dizziness.   mirabegron ER (MYRBETRIQ) 50 MG TB24 tablet Take 1 tablet (50 mg total) by  mouth daily.   Multiple Vitamins-Minerals (ONE-A-DAY VITACRAVES ADULT) CHEW Chew 1 tablet by mouth daily. Patient take gummy   scopolamine (TRANSDERM-SCOP) 1 MG/3DAYS Place 1 patch (1.5 mg total) onto the skin every 3 (three) days.   sertraline (ZOLOFT) 50 MG tablet TAKE 1 TABLET EVERY DAY (NEED MD APPOINTMENT)   traZODone (DESYREL) 50 MG tablet TAKE 1 TO 2 TABLETS AT NIGHT AS NEEDED FOR SLEEP       04/06/2023    8:29 AM 07/22/2022   11:40 AM 05/25/2022    1:19 PM 05/07/2022   10:05 AM  GAD 7 : Generalized Anxiety Score  Nervous, Anxious, on Edge 0 0 0 0  Control/stop worrying 0 0 0 0  Worry too much - different things 0 0 0 0  Trouble relaxing 0 0 0 0  Restless 0 0 0 0  Easily annoyed or  irritable 0 0 0 0  Afraid - awful might happen 0 0 0 0  Total GAD 7 Score 0 0 0 0  Anxiety Difficulty Not difficult at all Not difficult at all Not difficult at all Not difficult at all       04/06/2023    8:28 AM 08/21/2022    1:19 PM 07/22/2022   11:40 AM  Depression screen PHQ 2/9  Decreased Interest 0 0 0  Down, Depressed, Hopeless 0 0 0  PHQ - 2 Score 0 0 0  Altered sleeping 0 0 0  Tired, decreased energy 0 0 0  Change in appetite 0 0 0  Feeling bad or failure about yourself  0 0 0  Trouble concentrating 0 0 0  Moving slowly or fidgety/restless 0 0 0  Suicidal thoughts 0 0 0  PHQ-9 Score 0 0 0  Difficult doing work/chores Not difficult at all Not difficult at all Not difficult at all    BP Readings from Last 3 Encounters:  04/06/23 110/74  01/04/23 (!) 114/96  11/09/22 128/65    Physical Exam Vitals and nursing note reviewed. Exam conducted with a chaperone present.  Constitutional:      General: She is not irritable.She is not in acute distress.    Appearance: She is not diaphoretic.  HENT:     Head: Normocephalic and atraumatic.     Right Ear: Tympanic membrane and external ear normal.     Left Ear: Tympanic membrane and external ear normal.     Nose: Nose normal.     Mouth/Throat:     Mouth: Mucous membranes are dry.  Eyes:     General:        Right eye: No discharge.        Left eye: No discharge.     Conjunctiva/sclera: Conjunctivae normal.     Pupils: Pupils are equal, round, and reactive to light.  Neck:     Thyroid: No thyromegaly.     Vascular: No JVD.  Cardiovascular:     Rate and Rhythm: Normal rate and regular rhythm.     Heart sounds: Normal heart sounds. No murmur heard.    No friction rub. No gallop.  Pulmonary:     Effort: Pulmonary effort is normal.     Breath sounds: Normal breath sounds. No wheezing, rhonchi or rales.  Abdominal:     General: Bowel sounds are normal.     Palpations: Abdomen is soft. There is no mass.     Tenderness:  There is no abdominal tenderness. There is no guarding or rebound.  Musculoskeletal:  General: Normal range of motion.     Cervical back: Normal range of motion and neck supple.  Lymphadenopathy:     Cervical: No cervical adenopathy.  Skin:    General: Skin is warm and dry.     Findings: No erythema or lesion.  Neurological:     Mental Status: She is alert.     Deep Tendon Reflexes: Reflexes are normal and symmetric.     Wt Readings from Last 3 Encounters:  04/06/23 144 lb (65.3 kg)  01/04/23 144 lb (65.3 kg)  11/09/22 160 lb (72.6 kg)    BP 110/74   Pulse 69   Ht 5' (1.524 m)   Wt 144 lb (65.3 kg)   SpO2 97%   BMI 28.12 kg/m   Assessment and Plan: 1. Essential hypertension Chronic.  Controlled.  Stable.  Blood pressure today 110/74.  Asymptomatic.  Tolerating medication well.  Continue carvedilol 6.25 twice a day and losartan 25 mg once a day.  Will check renal function panel for electrolytes and GFR.  Will recheck patient in 6 months. - carvedilol (COREG) 6.25 MG tablet; Take 1 tablet (6.25 mg total) by mouth daily.  Dispense: 90 tablet; Refill: 1 - losartan (COZAAR) 25 MG tablet; Take 1 tablet (25 mg total) by mouth daily.  Dispense: 90 tablet; Refill: 1 - Renal Function Panel  2. Familial hypercholesterolemia Chronic.  Controlled.  Stable.  Continue atorvastatin 40 mg once a day.  Recheck of last lipid panel is acceptable. - atorvastatin (LIPITOR) 40 MG tablet; Take 1 tablet (40 mg total) by mouth daily.  Dispense: 90 tablet; Refill: 1  3. Gastroesophageal reflux disease without esophagitis Chronic.  Controlled.  Stable.  Continue Prevacid 30 mg once a day.  Will recheck in 6 months. - lansoprazole (PREVACID) 30 MG capsule; TAKE 1 CAPSULE EVERY DAY (NEED MD APPOINTMENT)  Dispense: 90 capsule; Refill: 1  4. Hypothyroidism, unspecified type 8.  Followed by endocrinology.  TSH is in normal range and levothyroxine directed by endocrinology.  5. Major depressive  disorder in partial remission, unspecified whether recurrent (HCC) Chronic.  Controlled.  Stable.  PHQ is 0 GAD score 0 continue sertraline 50 mg once a day. - sertraline (ZOLOFT) 50 MG tablet; Take 1 tablet (50 mg total) by mouth daily.  Dispense: 90 tablet; Refill: 1  6. Fatigue, unspecified type Patient is improving with fatigue and thyroid is in well-controlled. - losartan (COZAAR) 25 MG tablet; Take 1 tablet (25 mg total) by mouth daily.  Dispense: 90 tablet; Refill: 1     [name], MD

## 2023-04-07 ENCOUNTER — Encounter: Payer: Self-pay | Admitting: Family Medicine

## 2023-04-07 LAB — RENAL FUNCTION PANEL
Albumin: 3.9 g/dL (ref 3.8–4.8)
BUN/Creatinine Ratio: 28 (ref 12–28)
BUN: 24 mg/dL (ref 8–27)
CO2: 25 mmol/L (ref 20–29)
Calcium: 9.2 mg/dL (ref 8.7–10.3)
Chloride: 105 mmol/L (ref 96–106)
Creatinine, Ser: 0.85 mg/dL (ref 0.57–1.00)
Glucose: 159 mg/dL — ABNORMAL HIGH (ref 70–99)
Phosphorus: 3.8 mg/dL (ref 3.0–4.3)
Potassium: 4.4 mmol/L (ref 3.5–5.2)
Sodium: 143 mmol/L (ref 134–144)
eGFR: 70 mL/min/{1.73_m2} (ref >59)

## 2023-05-18 ENCOUNTER — Ambulatory Visit: Payer: Medicare PPO | Admitting: Family Medicine

## 2023-05-18 ENCOUNTER — Ambulatory Visit: Payer: Self-pay

## 2023-05-18 ENCOUNTER — Encounter: Payer: Self-pay | Admitting: Family Medicine

## 2023-05-18 VITALS — BP 118/74 | HR 74 | Ht 60.0 in | Wt 143.0 lb

## 2023-05-18 DIAGNOSIS — Z23 Encounter for immunization: Secondary | ICD-10-CM | POA: Diagnosis not present

## 2023-05-18 DIAGNOSIS — M25531 Pain in right wrist: Secondary | ICD-10-CM | POA: Diagnosis not present

## 2023-05-18 MED ORDER — MELOXICAM 7.5 MG PO TABS
7.5000 mg | ORAL_TABLET | Freq: Every day | ORAL | 0 refills | Status: AC
Start: 2023-05-18 — End: ?

## 2023-05-18 NOTE — Progress Notes (Signed)
     Primary Care / Sports Medicine Office Visit  Patient Information:  Patient ID: Sara Carr, female DOB: Jul 01, 1946 Age: 77 y.o. MRN: [id number]   Sara Carr is a pleasant 77 y.o. female presenting with the following:  Chief Complaint  Patient presents with   Joint Swelling    Right arm/wrist    Vitals:   05/18/23 1607  BP: 118/74  Pulse: 74  SpO2: 98%   Vitals:   05/18/23 1607  Weight: 143 lb (64.9 kg)  Height: 5' (1.524 m)   Body mass index is 27.93 kg/m.  No results found.   Independent interpretation of notes and tests performed by another provider:   None  Procedures performed:   None  Pertinent History, Exam, Impression, and Recommendations:   Problem List Items Addressed This Visit       Other   Need for immunization against influenza    Received annual influenza vaccination today.      Relevant Orders   Flu Vaccine Trivalent High Dose (Fluad) (Completed)   Arthralgia of right wrist - Primary    Right-hand-dominant patient presenting with atraumatic 2-day onset of right wrist pain and swelling.  Is highly active at baseline with housework and had been performing the same preceding the symptoms.  Pain localized to wrist diffusely with radiation to the fingers, has noted swelling, no ecchymosis.  Exam with full, painful range of motion, nontender at the radial styloid, first CMC, MCPs, epicondyles, resisted flexion/extension and radial/ulnar deviation benign, maximum tenderness that recreates her symptoms at the DRUJ.  Findings are most consistent with osteoarthritis related right wrist arthralgia, this represents a chronic condition with active flare.  Plan: - Start use of "cock up wrist brace" and use daily during your wakeful hours, okay to remove for sleep, handwashing, eating if necessary - Start topical anti-inflammatory medicine (Voltaren gel), apply 4 times a day on clean and dry skin - After see see if ineffective after 1-2 days then  switch to meloxicam (tablet anti-inflammatory) - Once starting meloxicam, dose daily with food x 7 days then daily as needed - Contact for any questions and follow-up as needed      Relevant Medications   meloxicam (MOBIC) 7.5 MG tablet     Orders & Medications Medications:  Meds ordered this encounter  Medications   meloxicam (MOBIC) 7.5 MG tablet    Sig: Take 1 tablet (7.5 mg total) by mouth daily.    Dispense:  14 tablet    Refill:  0   Orders Placed This Encounter  Procedures   Flu Vaccine Trivalent High Dose (Fluad)     No follow-ups on file.     Jerrol Banana, MD, [facility]   Primary Care Sports Medicine Primary Care and Sports Medicine at [facility]

## 2023-05-18 NOTE — Patient Instructions (Addendum)
-   Start use of "cock up wrist brace" and use daily during your wakeful hours, okay to remove for sleep, handwashing, eating if necessary - Start topical anti-inflammatory medicine (Voltaren gel), apply 4 times a day on clean and dry skin - After see see if ineffective after 1-2 days then switch to meloxicam (tablet anti-inflammatory) - Once starting meloxicam, dose daily with food x 7 days then daily as needed - Contact for any questions and follow-up as needed

## 2023-05-18 NOTE — Assessment & Plan Note (Signed)
Received annual influenza vaccination today.

## 2023-05-18 NOTE — Assessment & Plan Note (Signed)
Right-hand-dominant patient presenting with atraumatic 2-day onset of right wrist pain and swelling.  Is highly active at baseline with housework and had been performing the same preceding the symptoms.  Pain localized to wrist diffusely with radiation to the fingers, has noted swelling, no ecchymosis.  Exam with full, painful range of motion, nontender at the radial styloid, first CMC, MCPs, epicondyles, resisted flexion/extension and radial/ulnar deviation benign, maximum tenderness that recreates her symptoms at the DRUJ.  Findings are most consistent with osteoarthritis related right wrist arthralgia, this represents a chronic condition with active flare.  Plan: - Start use of "cock up wrist brace" and use daily during your wakeful hours, okay to remove for sleep, handwashing, eating if necessary - Start topical anti-inflammatory medicine (Voltaren gel), apply 4 times a day on clean and dry skin - After see see if ineffective after 1-2 days then switch to meloxicam (tablet anti-inflammatory) - Once starting meloxicam, dose daily with food x 7 days then daily as needed - Contact for any questions and follow-up as needed

## 2023-05-18 NOTE — Telephone Encounter (Signed)
  Chief Complaint: right arm pain Symptoms: right wrist swollen Frequency: yesterday  Pertinent Negatives: Patient denies weakness or numbness or fever Disposition: [] ED /[] Urgent Care (no appt availability in office) / [x] Appointment(In office/virtual)/ []  [place] [facility]/ [] Home Care/ [] Refused Recommended Disposition /[] [place] [facility]/ []  Follow-up with PCP Additional Notes: no appts with PCP Reason for Disposition  Swollen joint of new-onset  Answer Assessment - Initial Assessment Questions 1. ONSET: "When did the pain start?"     Yesterday  2. LOCATION: "Where is the pain located?"     Right hand  3. PAIN: "How bad is the pain?" (Scale 1-10; or mild, moderate, severe)   - MILD (1-3): Doesn't interfere with normal activities.   - MODERATE (4-7): Interferes with normal activities (e.g., work or school) or awakens from sleep.   - SEVERE (8-10): Excruciating pain, unable to do any normal activities, unable to hold a cup of water.     moderate 4. WORK OR EXERCISE: "Has there been any recent work or exercise that involved this part of the body?"     no 5. CAUSE: "What do you think is causing the arm pain?"     unsure 6. OTHER SYMPTOMS: "Do you have any other symptoms?" (e.g., neck pain, swelling, rash, fever, numbness, weakness)     swelling 7. PREGNANCY: "Is there any chance you are pregnant?" "When was your last menstrual period?"     N/a  Answer Assessment - Initial Assessment Questions 1. ONSET: "When did the pain start?"     Yesterday  2. LOCATION: "Where is the pain located?"     Right arm/right wrist 3. PAIN: "How bad is the pain?" (Scale 1-10; or mild, moderate, severe)   - MILD (1-3): doesn't interfere with normal activities   - MODERATE (4-7): interferes with normal activities (e.g., work or school) or awakens from sleep   - SEVERE (8-10): excruciating pain, unable to use hand at all     moderate 4. WORK OR EXERCISE: "Has there been any recent work or  exercise that involved this part (i.e., hand or wrist) of the body?"     no 5. CAUSE: "What do you think is causing the pain?"     unsure 6. AGGRAVATING FACTORS: "What makes the pain worse?" (e.g., using computer)     no 7. OTHER SYMPTOMS: "Do you have any other symptoms?" (e.g., neck pain, swelling, rash, numbness, fever)     Swelling to wrist  Protocols used: Arm Pain-A-AH, Hand and Wrist Pain-A-AH

## 2023-05-31 ENCOUNTER — Other Ambulatory Visit: Payer: Self-pay | Admitting: Family Medicine

## 2023-05-31 DIAGNOSIS — I10 Essential (primary) hypertension: Secondary | ICD-10-CM

## 2023-05-31 DIAGNOSIS — R5383 Other fatigue: Secondary | ICD-10-CM

## 2023-05-31 DIAGNOSIS — E7801 Familial hypercholesterolemia: Secondary | ICD-10-CM

## 2023-05-31 DIAGNOSIS — F324 Major depressive disorder, single episode, in partial remission: Secondary | ICD-10-CM

## 2023-07-01 ENCOUNTER — Ambulatory Visit (INDEPENDENT_AMBULATORY_CARE_PROVIDER_SITE_OTHER): Payer: Medicare PPO

## 2023-07-01 DIAGNOSIS — Z23 Encounter for immunization: Secondary | ICD-10-CM | POA: Diagnosis not present

## 2023-07-01 DIAGNOSIS — G8929 Other chronic pain: Secondary | ICD-10-CM | POA: Diagnosis not present

## 2023-07-01 DIAGNOSIS — R413 Other amnesia: Secondary | ICD-10-CM | POA: Diagnosis not present

## 2023-07-01 DIAGNOSIS — E1069 Type 1 diabetes mellitus with other specified complication: Secondary | ICD-10-CM | POA: Diagnosis not present

## 2023-07-01 DIAGNOSIS — G459 Transient cerebral ischemic attack, unspecified: Secondary | ICD-10-CM | POA: Diagnosis not present

## 2023-07-01 DIAGNOSIS — G479 Sleep disorder, unspecified: Secondary | ICD-10-CM | POA: Diagnosis not present

## 2023-07-01 DIAGNOSIS — M545 Low back pain, unspecified: Secondary | ICD-10-CM | POA: Diagnosis not present

## 2023-07-23 DIAGNOSIS — L821 Other seborrheic keratosis: Secondary | ICD-10-CM | POA: Diagnosis not present

## 2023-08-03 DIAGNOSIS — E1142 Type 2 diabetes mellitus with diabetic polyneuropathy: Secondary | ICD-10-CM | POA: Diagnosis not present

## 2023-08-03 DIAGNOSIS — L603 Nail dystrophy: Secondary | ICD-10-CM | POA: Diagnosis not present

## 2023-08-03 DIAGNOSIS — L851 Acquired keratosis [keratoderma] palmaris et plantaris: Secondary | ICD-10-CM | POA: Diagnosis not present

## 2023-08-23 DIAGNOSIS — E063 Autoimmune thyroiditis: Secondary | ICD-10-CM | POA: Diagnosis not present

## 2023-08-23 DIAGNOSIS — E538 Deficiency of other specified B group vitamins: Secondary | ICD-10-CM | POA: Diagnosis not present

## 2023-08-23 DIAGNOSIS — E785 Hyperlipidemia, unspecified: Secondary | ICD-10-CM | POA: Diagnosis not present

## 2023-08-23 DIAGNOSIS — I152 Hypertension secondary to endocrine disorders: Secondary | ICD-10-CM | POA: Diagnosis not present

## 2023-08-23 DIAGNOSIS — E1169 Type 2 diabetes mellitus with other specified complication: Secondary | ICD-10-CM | POA: Diagnosis not present

## 2023-08-23 DIAGNOSIS — E1165 Type 2 diabetes mellitus with hyperglycemia: Secondary | ICD-10-CM | POA: Diagnosis not present

## 2023-08-23 DIAGNOSIS — E1159 Type 2 diabetes mellitus with other circulatory complications: Secondary | ICD-10-CM | POA: Diagnosis not present

## 2023-08-25 ENCOUNTER — Other Ambulatory Visit: Payer: Self-pay | Admitting: Family Medicine

## 2023-08-25 ENCOUNTER — Ambulatory Visit: Payer: Medicare PPO

## 2023-08-25 DIAGNOSIS — Z0000 Encounter for general adult medical examination without abnormal findings: Secondary | ICD-10-CM

## 2023-08-25 DIAGNOSIS — Z1231 Encounter for screening mammogram for malignant neoplasm of breast: Secondary | ICD-10-CM

## 2023-08-25 DIAGNOSIS — Z780 Asymptomatic menopausal state: Secondary | ICD-10-CM | POA: Diagnosis not present

## 2023-08-25 NOTE — Patient Instructions (Signed)
 Ms. Fullman , Thank you for taking time to come for your Medicare Wellness Visit. I appreciate your ongoing commitment to your health goals. Please review the following plan we discussed and let me know if I can assist you in the future.   Referrals/Orders/Follow-Ups/Clinician Recommendations: NONE  This is a list of the screening recommended for you and due dates:  Health Maintenance  Topic Date Due   Eye exam for diabetics  Never done   Yearly kidney health urinalysis for diabetes  Never done   Hepatitis C Screening  Never done   Zoster (Shingles) Vaccine (1 of 2) 06/08/1963   Hemoglobin A1C  07/07/2023   COVID-19 Vaccine (7 - 2024-25 season) 08/26/2023   Complete foot exam   01/04/2024   Yearly kidney function blood test for diabetes  04/05/2024   Medicare Annual Wellness Visit  08/24/2024   DTaP/Tdap/Td vaccine (2 - Td or Tdap) 11/15/2025   Pneumonia Vaccine  Completed   Flu Shot  Completed   DEXA scan (bone density measurement)  Completed   HPV Vaccine  Aged Out   Colon Cancer Screening  Discontinued    Advanced directives: (In Chart) A copy of your advanced directives are scanned into your chart should your provider ever need it.  Next Medicare Annual Wellness Visit scheduled for next year: Yes   08/30/24 @ 1:20 PM BY VIDEO

## 2023-08-25 NOTE — Progress Notes (Signed)
 Subjective:   Sara Carr is a 78 y.o. female who presents for Medicare Annual (Subsequent) preventive examination.  Visit Complete: Virtual I connected with  Katelynn Forsman on 08/25/23 by a audio enabled telemedicine application and verified that I am speaking with the correct person using two identifiers.  Interactive audio and video telecommunications were attempted between this provider and patient, however failed, due to patient having technical difficulties OR patient did not have access to video capability.  We continued and completed visit with audio only.   Patient Location: Home  Provider Location: Office/Clinic  I discussed the limitations of evaluation and management by telemedicine. The patient expressed understanding and agreed to proceed.  Vital Signs: Because this visit was a virtual/telehealth visit, some criteria may be missing or patient reported. Any vitals not documented were not able to be obtained and vitals that have been documented are patient reported.  Cardiac Risk Factors include: advanced age (>83men, >7 women);diabetes mellitus;dyslipidemia;hypertension     Objective:    There were no vitals filed for this visit. There is no height or weight on file to calculate BMI.     08/25/2023    1:32 PM 08/21/2022    1:43 PM 03/21/2022   12:16 PM 08/13/2021    2:22 PM 08/07/2020    2:16 PM 01/22/2020    5:00 PM 01/22/2020    6:25 AM  Advanced Directives  Does Patient Have a Medical Advance Directive? Yes Yes Yes Yes Yes Yes   Type of Estate Agent of [place];Living will Healthcare Power of [place];Living will Living will Healthcare Power of [place];Living will Healthcare Power of [place];Living will Healthcare Power of [place];Living will Healthcare Power of [place];Living will  Does patient want to make changes to medical advance directive? No - Patient declined No - Patient declined    No - Patient declined   Copy of Healthcare Power of  Attorney in Chart? Yes - validated most recent copy scanned in chart (See row information) Yes - validated most recent copy scanned in chart (See row information)  Yes - validated most recent copy scanned in chart (See row information) Yes - validated most recent copy scanned in chart (See row information) Yes - validated most recent copy scanned in chart (See row information) Yes - validated most recent copy scanned in chart (See row information)    Current Medications (verified) Outpatient Encounter Medications as of 08/25/2023  Medication Sig   atorvastatin  (LIPITOR) 40 MG tablet TAKE 1 TABLET EVERY DAY (NEED MD APPOINTMENT)   carvedilol  (COREG ) 6.25 MG tablet TAKE 1 TABLET EVERY DAY (NEED MD APPOINTMENT)   Continuous Blood Gluc Receiver (FREESTYLE LIBRE 2 READER) DEVI USE TO MONITOR BLOOD GLUCOSE.   docusate sodium  (COLACE) 50 MG capsule Take 1 capsule (50 mg total) by mouth daily.   Dulaglutide 1.5 MG/0.5ML SOPN Inject into the skin. Inject 0.5 mLs (1.5 mg total) subcutaneously every 7 (seven) days   gabapentin  (NEURONTIN ) 800 MG tablet Take 800 mg by mouth 2 (two) times daily as needed (pain). [place]   INVOKAMET XR 150-500 MG TB24 Take 1 tablet by mouth in the morning and at bedtime. 2 at breakfast 1 at night   lansoprazole  (PREVACID ) 30 MG capsule TAKE 1 CAPSULE EVERY DAY (NEED MD APPOINTMENT)   levothyroxine  (SYNTHROID ) 88 MCG tablet Take 1 tablet (88 mcg total) by mouth daily before breakfast. Once a day   losartan  (COZAAR ) 25 MG tablet TAKE 1 TABLET EVERY DAY (NEED MD APPOINTMENT)   meclizine  (ANTIVERT )  25 MG tablet Take 1 tablet (25 mg total) by mouth 3 (three) times daily as needed for dizziness.   mirabegron  ER (MYRBETRIQ ) 50 MG TB24 tablet Take 1 tablet (50 mg total) by mouth daily.   Multiple Vitamins-Minerals (ONE-A-DAY VITACRAVES ADULT) CHEW Chew 1 tablet by mouth daily. Patient take gummy   sertraline  (ZOLOFT ) 50 MG tablet TAKE 1 TABLET EVERY DAY (NEED MD APPOINTMENT)    traZODone (DESYREL) 50 MG tablet TAKE 1 TO 2 TABLETS AT NIGHT AS NEEDED FOR SLEEP   meloxicam  (MOBIC ) 7.5 MG tablet Take 1 tablet (7.5 mg total) by mouth daily. (Patient not taking: Reported on 08/25/2023)   scopolamine  (TRANSDERM-SCOP) 1 MG/3DAYS Place 1 patch (1.5 mg total) onto the skin every 3 (three) days. (Patient not taking: Reported on 08/25/2023)   No facility-administered encounter medications on file as of 08/25/2023.    Allergies (verified) Penicillins   History: Past Medical History:  Diagnosis Date   Allergy    Anemia    as a little gitrl, but not anymore    Arthritis    Back pain    Depression    Diabetes mellitus without complication (HCC)    GERD (gastroesophageal reflux disease)    Hyperlipidemia    Hypertension    Hypothyroidism    Thyroid  disease    Past Surgical History:  Procedure Laterality Date   BACK SURGERY     CHOLECYSTECTOMY  1979   JOINT REPLACEMENT Bilateral 2017   LUMBAR LAMINECTOMY/ DECOMPRESSION WITH MET-RX N/A 01/22/2020   Procedure: L3-4 laminectomy;  Surgeon: Bluford Standing, MD;  Location: [facility] ORS;  Service: Neurosurgery;  Laterality: N/A;   REPLACEMENT TOTAL KNEE Bilateral    SPINE SURGERY  2004   Family History  Problem Relation Age of Onset   Stroke Mother    Heart disease Father    Diabetes Father    Hypertension Father    Drug abuse Daughter    Early death Brother    Breast cancer Neg Hx    Social History   Socioeconomic History   Marital status: Married    Spouse name: Axel Meas   Number of children: 2   Years of education: Not on file   Highest education level: 12th grade  Occupational History   Occupation: Retired  Tobacco Use   Smoking status: Never    Passive exposure: Never   Smokeless tobacco: Never   Tobacco comments:    none  Vaping Use   Vaping status: Never Used  Substance and Sexual Activity   Alcohol use: Never   Drug use: Never   Sexual activity: Not Currently    Birth control/protection: None  Other  Topics Concern   Not on file  Social History Narrative   Not on file   Social Drivers of Health   Financial Resource Strain: Low Risk  (08/25/2023)   Overall Financial Resource Strain (CARDIA)    Difficulty of Paying Living Expenses: Not hard at all  Food Insecurity: No Food Insecurity (08/25/2023)   Hunger Vital Sign    Worried About Running Out of Food in the Last Year: Never true    Ran Out of Food in the Last Year: Never true  Transportation Needs: No Transportation Needs (08/25/2023)   PRAPARE - Administrator, Civil Service (Medical): No    Lack of Transportation (Non-Medical): No  Physical Activity: Insufficiently Active (08/25/2023)   Exercise Vital Sign    Days of Exercise per Week: 7 days    Minutes of  Exercise per Session: 20 min  Stress: No Stress Concern Present (08/25/2023)   [employer] of Occupational Health - Occupational Stress Questionnaire    Feeling of Stress : Not at all  Social Connections: Moderately Isolated (08/25/2023)   Social Connection and Isolation Panel [NHANES]    Frequency of Communication with Friends and Family: Once a week    Frequency of Social Gatherings with Friends and Family: More than three times a week    Attends Religious Services: Never    Database Administrator or Organizations: No    Attends Engineer, Structural: Never    Marital Status: Married    Tobacco Counseling Counseling given: Not Answered Tobacco comments: none   Clinical Intake:  Pre-visit preparation completed: Yes  Pain : No/denies pain     BMI - recorded: 27.9 Nutritional Status: BMI 25 -29 Overweight Nutritional Risks: None Diabetes: Yes CBG done?: No Did pt. bring in CBG monitor from home?: No  How often do you need to have someone help you when you read instructions, pamphlets, or other written materials from your doctor or pharmacy?: 1 - Never  Interpreter Needed?: No  Information entered by :: JHONNIE DAS, LPN   Activities  of Daily Living    08/25/2023    1:33 PM  In your present state of health, do you have any difficulty performing the following activities:  Hearing? 1  Vision? 0  Difficulty concentrating or making decisions? 0  Walking or climbing stairs? 0  Dressing or bathing? 0  Doing errands, shopping? 0  Preparing Food and eating ? N  Using the Toilet? N  In the past six months, have you accidently leaked urine? N  Do you have problems with loss of bowel control? N  Managing your Medications? N  Managing your Finances? N  Housekeeping or managing your Housekeeping? N    Patient Care Team: Joshua Cathryne BROCKS, MD as PCP - General (Family Medicine) Cherilyn Debby CROME, MD as Consulting Physician (Internal Medicine) Myrna Adine Anes, MD as Consulting Physician (Ophthalmology)  Indicate any recent Medical Services you may have received from other than [facility] providers in the past year (date may be approximate).     Assessment:   This is a routine wellness examination for Janey.  Hearing/Vision screen Hearing Screening - Comments:: WEARS AID ON RIGHT EAR Vision Screening - Comments:: WEARS GLASSES ALL THE TIME- [email or web address]   Goals Addressed             This Visit's Progress    DIET - EAT MORE FRUITS AND VEGETABLES         Depression Screen    08/25/2023    1:31 PM 04/06/2023    8:28 AM 08/21/2022    1:19 PM 07/22/2022   11:40 AM 05/25/2022    1:19 PM 05/07/2022   10:05 AM 04/16/2022    2:13 PM  PHQ 2/9 Scores  PHQ - 2 Score 0 0 0 0 0 0 0  PHQ- 9 Score 0 0 0 0 0 0 0    Fall Risk    08/25/2023    1:33 PM 04/06/2023    8:28 AM 08/21/2022    1:21 PM 07/22/2022   11:40 AM 05/25/2022    1:19 PM  Fall Risk   Falls in the past year? 0 0 1 0 0  Number falls in past yr: 0 0 0 0 0  Injury with Fall? 0 0 1 0 0  Risk for fall due to :  No Fall Risks No Fall Risks Impaired balance/gait No Fall Risks No Fall Risks  Follow up Falls prevention discussed;Falls evaluation completed Falls evaluation  completed Falls evaluation completed Falls evaluation completed Falls evaluation completed    MEDICARE RISK AT HOME: Medicare Risk at Home Any stairs in or around the home?: No If so, are there any without handrails?: No Home free of loose throw rugs in walkways, pet beds, electrical cords, etc?: Yes Adequate lighting in your home to reduce risk of falls?: Yes Life alert?: No Use of a cane, walker or w/c?: Yes (CANE SOMETIMES) Grab bars in the bathroom?: Yes Shower chair or bench in shower?: Yes Elevated toilet seat or a handicapped toilet?: No  TIMED UP AND GO:  Was the test performed?  No    Cognitive Function:        08/25/2023    1:35 PM 08/21/2022    1:23 PM 08/13/2021    2:26 PM 08/07/2020    2:21 PM  6CIT Screen  What Year? 0 points 0 points 0 points 0 points  What month? 0 points 0 points 0 points 0 points  What time? 0 points 0 points 0 points 0 points  Count back from 20 0 points 0 points 0 points 0 points  Months in reverse 0 points 0 points 4 points 4 points  Repeat phrase 2 points 2 points 0 points 4 points  Total Score 2 points 2 points 4 points 8 points    Immunizations Immunization History  Administered Date(s) Administered   Fluad Quad(high Dose 65+) 06/27/2019, 05/30/2020, 05/05/2021, 05/07/2022   Fluad Trivalent(High Dose 65+) 05/18/2023   Influenza-Unspecified 10/14/2006, 07/13/2016   PFIZER Comirnaty(Gray Top)Covid-19 Tri-Sucrose Vaccine 03/24/2021   PFIZER(Purple Top)SARS-COV-2 Vaccination 08/29/2019, 09/16/2019, 06/10/2020   Pfizer Covid-19 Vaccine Bivalent Booster 60yrs & up 10/03/2021   Pfizer(Comirnaty)Fall Seasonal Vaccine 12 years and older 07/01/2023   Pneumococcal Conjugate-13 06/27/2019   Pneumococcal Polysaccharide-23 07/13/2016   Respiratory Syncytial Virus Vaccine,Recomb Aduvanted(Arexvy) 05/23/2022   Tdap 11/16/2015   Zoster, Live 02/27/2011    TDAP status: Up to date  Flu Vaccine status: Up to date  Pneumococcal vaccine  status: Up to date  Covid-19 vaccine status: Completed vaccines  Qualifies for Shingles Vaccine? Yes   Zostavax completed Yes   Shingrix Completed?: No.    Education has been provided regarding the importance of this vaccine. Patient has been advised to call insurance company to determine out of pocket expense if they have not yet received this vaccine. Advised may also receive vaccine at local pharmacy or Health Dept. Verbalized acceptance and understanding.  Screening Tests Health Maintenance  Topic Date Due   OPHTHALMOLOGY EXAM  Never done   Diabetic kidney evaluation - Urine ACR  Never done   Hepatitis C Screening  Never done   Zoster Vaccines- Shingrix (1 of 2) 06/08/1963   HEMOGLOBIN A1C  07/07/2023   COVID-19 Vaccine (7 - 2024-25 season) 08/26/2023   FOOT EXAM  01/04/2024   Diabetic kidney evaluation - eGFR measurement  04/05/2024   Medicare Annual Wellness (AWV)  08/24/2024   DTaP/Tdap/Td (2 - Td or Tdap) 11/15/2025   Pneumonia Vaccine 60+ Years old  Completed   INFLUENZA VACCINE  Completed   DEXA SCAN  Completed   HPV VACCINES  Aged Out   Colonoscopy  Discontinued    Health Maintenance  Health Maintenance Due  Topic Date Due   OPHTHALMOLOGY EXAM  Never done   Diabetic kidney evaluation - Urine ACR  Never done  Hepatitis C Screening  Never done   Zoster Vaccines- Shingrix (1 of 2) 06/08/1963   HEMOGLOBIN A1C  07/07/2023   COVID-19 Vaccine (7 - 2024-25 season) 08/26/2023    Colorectal cancer screening: No longer required.   Mammogram status: No longer required due to AGE.  Bone Density status: Completed 09/30/20. Results reflect: Bone density results: OSTEOPENIA. Repeat every 5 years.  Lung Cancer Screening: (Low Dose CT Chest recommended if Age 34-80 years, 20 pack-year currently smoking OR have quit w/in 15years.) does not qualify.    Additional Screening:  Hepatitis C Screening: does qualify; Completed NO  Vision Screening: Recommended annual  ophthalmology exams for early detection of glaucoma and other disorders of the eye. Is the patient up to date with their annual eye exam?  Yes  Who is the provider or what is the name of the office in which the patient attends annual eye exams? DR.MYRNA If pt is not established with a provider, would they like to be referred to a provider to establish care? No .   Dental Screening: Recommended annual dental exams for proper oral hygiene   Community Resource Referral / Chronic Care Management: CRR required this visit?  No   CCM required this visit?  No     Plan:     I have personally reviewed and noted the following in the patient's chart:   Medical and social history Use of alcohol, tobacco or illicit drugs  Current medications and supplements including opioid prescriptions. Patient is not currently taking opioid prescriptions. Functional ability and status Nutritional status Physical activity Advanced directives List of other physicians Hospitalizations, surgeries, and ER visits in previous 12 months Vitals Screenings to include cognitive, depression, and falls Referrals and appointments  In addition, I have reviewed and discussed with patient certain preventive protocols, quality metrics, and best practice recommendations. A written personalized care plan for preventive services as well as general preventive health recommendations were provided to patient.     Jhonnie GORMAN Das, LPN   03/17/7973   After Visit Summary: (MyChart) Due to this being a telephonic visit, the after visit summary with patients personalized plan was offered to patient via MyChart   Nurse Notes: DEXA REFERRAL SENT

## 2023-10-07 ENCOUNTER — Encounter: Payer: Self-pay | Admitting: Family Medicine

## 2023-10-07 ENCOUNTER — Ambulatory Visit (INDEPENDENT_AMBULATORY_CARE_PROVIDER_SITE_OTHER): Payer: Self-pay | Admitting: Family Medicine

## 2023-10-07 VITALS — BP 134/76 | HR 89 | Ht 60.0 in | Wt 142.2 lb

## 2023-10-07 DIAGNOSIS — K219 Gastro-esophageal reflux disease without esophagitis: Secondary | ICD-10-CM | POA: Diagnosis not present

## 2023-10-07 DIAGNOSIS — F324 Major depressive disorder, single episode, in partial remission: Secondary | ICD-10-CM | POA: Diagnosis not present

## 2023-10-07 DIAGNOSIS — E7801 Familial hypercholesterolemia: Secondary | ICD-10-CM

## 2023-10-07 DIAGNOSIS — I10 Essential (primary) hypertension: Secondary | ICD-10-CM

## 2023-10-07 DIAGNOSIS — R5383 Other fatigue: Secondary | ICD-10-CM | POA: Diagnosis not present

## 2023-10-07 DIAGNOSIS — E039 Hypothyroidism, unspecified: Secondary | ICD-10-CM | POA: Diagnosis not present

## 2023-10-07 MED ORDER — LOSARTAN POTASSIUM 25 MG PO TABS: 25.0000 mg | ORAL_TABLET | Freq: Every day | ORAL | 1 refills | Status: AC

## 2023-10-07 MED ORDER — ATORVASTATIN CALCIUM 40 MG PO TABS: 40.0000 mg | ORAL_TABLET | Freq: Every day | ORAL | 1 refills | Status: AC

## 2023-10-07 MED ORDER — LEVOTHYROXINE SODIUM 88 MCG PO TABS: 88.0000 ug | ORAL_TABLET | Freq: Every day | ORAL | 1 refills | Status: DC

## 2023-10-07 MED ORDER — LANSOPRAZOLE 30 MG PO CPDR
DELAYED_RELEASE_CAPSULE | ORAL | 1 refills | Status: AC
Start: 2023-10-07 — End: ?

## 2023-10-07 MED ORDER — SERTRALINE HCL 50 MG PO TABS: 50.0000 mg | ORAL_TABLET | Freq: Every day | ORAL | 1 refills | Status: DC

## 2023-10-07 MED ORDER — CARVEDILOL 6.25 MG PO TABS: 6.2500 mg | ORAL_TABLET | Freq: Two times a day (BID) | ORAL | 1 refills | Status: DC

## 2023-10-07 NOTE — Patient Instructions (Signed)

## 2023-10-07 NOTE — Progress Notes (Signed)
 Date:  10/07/2023   Name:  Sara Carr   DOB:  Aug 08, 1946   MRN:  [id number]   Chief Complaint: Hypertension (Patient presents today for her HTN. She has been taking her medication as directed and does not have any concerns today. )  Hypertension This is a chronic problem. The current episode started more than 1 year ago. The problem has been gradually improving since onset. The problem is controlled. Pertinent negatives include no blurred vision, chest pain, headaches, neck pain, orthopnea, palpitations, peripheral edema, PND or shortness of breath. There are no associated agents to hypertension. Past treatments include beta blockers, alpha 1 blockers and angiotensin blockers. The current treatment provides moderate improvement. There are no compliance problems.  Hypertensive end-organ damage includes CVA. There is no history of CAD/MI. Identifiable causes of hypertension include a thyroid problem. There is no history of chronic renal disease, a hypertension causing med or renovascular disease.  Hyperlipidemia This is a chronic problem. The current episode started more than 1 year ago. The problem is controlled. Exacerbating diseases include diabetes and hypothyroidism. She has no history of chronic renal disease. Pertinent negatives include no chest pain, focal sensory loss, leg pain, myalgias or shortness of breath. Current antihyperlipidemic treatment includes statins. The current treatment provides moderate improvement of lipids. There are no compliance problems.   Gastroesophageal Reflux She reports no abdominal pain, no chest pain, no choking, no coughing, no dysphagia, no heartburn, no hoarse voice, no nausea, no sore throat or no wheezing. This is a chronic problem. The problem occurs rarely. The problem has been gradually improving. Associated symptoms include fatigue and weight loss. She has tried a histamine-2 antagonist for the symptoms. The treatment provided moderate relief.  Thyroid  Problem Presents for follow-up visit. Symptoms include cold intolerance, constipation, dry skin, fatigue, nail problem and weight loss. Patient reports no anxiety, depressed mood, diaphoresis, diarrhea, hair loss, heat intolerance, hoarse voice, leg swelling, menstrual problem, palpitations, tremors, visual change or weight gain. Her past medical history is significant for diabetes and hyperlipidemia.  Depression        This is a chronic problem.  The current episode started more than 1 year ago.   The onset quality is gradual.   The problem occurs intermittently.  The problem has been gradually improving since onset.  Associated symptoms include fatigue.  Associated symptoms include no decreased concentration, no helplessness, no hopelessness, does not have insomnia, not irritable, no restlessness, no decreased interest, no appetite change, no body aches, no myalgias, no headaches, no indigestion, not sad and no suicidal ideas.     The symptoms are aggravated by nothing.  Past treatments include SSRIs - Selective serotonin reuptake inhibitors.  Compliance with treatment is good.  Previous treatment provided mild relief.  Past medical history includes hypothyroidism and thyroid problem.     Lab Results  Component Value Date   NA 143 04/06/2023   K 4.4 04/06/2023   CO2 25 04/06/2023   GLUCOSE 159 (H) 04/06/2023   BUN 24 04/06/2023   CREATININE 0.85 04/06/2023   CALCIUM 9.2 04/06/2023   EGFR 70 04/06/2023   GFRNONAA 54 (L) 11/10/2021   Lab Results  Component Value Date   CHOL 147 05/05/2021   HDL 39 (L) 05/05/2021   LDLCALC 83 05/05/2021   TRIG 140 05/05/2021   Lab Results  Component Value Date   TSH 0.166 (L) 05/25/2022   Lab Results  Component Value Date   HGBA1C 6.9 01/04/2023   Lab Results  Component Value Date   WBC 5.9 05/04/2022   HGB 10.5 (A) 05/04/2022   HCT 33 (A) 05/04/2022   MCV 88 11/15/2020   PLT 231 05/04/2022   Lab Results  Component Value Date   ALT 10  05/04/2022   AST 27 05/04/2022   ALKPHOS 57 05/04/2022   No results found for: "25OHVITD2", "25OHVITD3", "VD25OH"   Review of Systems  Constitutional:  Positive for fatigue and weight loss. Negative for appetite change, chills, diaphoresis, unexpected weight change and weight gain.  HENT:  Negative for hoarse voice, sinus pressure, sore throat and trouble swallowing.   Eyes:  Negative for blurred vision and visual disturbance.  Respiratory:  Negative for cough, choking, chest tightness, shortness of breath and wheezing.   Cardiovascular:  Negative for chest pain, palpitations, orthopnea and PND.  Gastrointestinal:  Positive for constipation. Negative for abdominal pain, diarrhea, dysphagia, heartburn and nausea.  Endocrine: Positive for cold intolerance. Negative for heat intolerance, polydipsia and polyuria.  Genitourinary:  Negative for difficulty urinating, menstrual problem and vaginal bleeding.  Musculoskeletal:  Negative for myalgias and neck pain.  Neurological:  Negative for tremors and headaches.  Psychiatric/Behavioral:  Positive for depression. Negative for decreased concentration and suicidal ideas. The patient is not nervous/anxious and does not have insomnia.     Patient Active Problem List   Diagnosis Date Noted   Arthralgia of right wrist 05/18/2023   Need for immunization against influenza 05/18/2023   Lumbar radiculopathy 01/22/2020    Allergies  Allergen Reactions   Penicillins Hives and Swelling    Past Surgical History:  Procedure Laterality Date   BACK SURGERY     CHOLECYSTECTOMY  1979   JOINT REPLACEMENT Bilateral 2017   LUMBAR LAMINECTOMY/ DECOMPRESSION WITH MET-RX N/A 01/22/2020   Procedure: L3-4 laminectomy;  Surgeon: Lucy Chris, MD;  Location: [facility] ORS;  Service: Neurosurgery;  Laterality: N/A;   REPLACEMENT TOTAL KNEE Bilateral    SPINE SURGERY  2004    Social History   Tobacco Use   Smoking status: Never    Passive exposure: Never    Smokeless tobacco: Never   Tobacco comments:    none  Vaping Use   Vaping status: Never Used  Substance Use Topics   Alcohol use: Never   Drug use: Never     Medication list has been reviewed and updated.  Current Meds  Medication Sig   atorvastatin (LIPITOR) 40 MG tablet TAKE 1 TABLET EVERY DAY (NEED MD APPOINTMENT)   carvedilol (COREG) 6.25 MG tablet TAKE 1 TABLET EVERY DAY (NEED MD APPOINTMENT)   Continuous Blood Gluc Receiver (FREESTYLE LIBRE 2 READER) DEVI USE TO MONITOR BLOOD GLUCOSE.   docusate sodium (COLACE) 50 MG capsule Take 1 capsule (50 mg total) by mouth daily.   Dulaglutide 1.5 MG/0.5ML SOPN Inject into the skin. Inject 0.5 mLs (1.5 mg total) subcutaneously every 7 (seven) days   gabapentin (NEURONTIN) 800 MG tablet Take 800 mg by mouth 2 (two) times daily as needed (pain). blackwood   INVOKAMET XR 150-500 MG TB24 Take 1 tablet by mouth in the morning and at bedtime. 2 at breakfast 1 at night   lansoprazole (PREVACID) 30 MG capsule TAKE 1 CAPSULE EVERY DAY (NEED MD APPOINTMENT)   levothyroxine (SYNTHROID) 88 MCG tablet Take 1 tablet (88 mcg total) by mouth daily before breakfast. Once a day   losartan (COZAAR) 25 MG tablet TAKE 1 TABLET EVERY DAY (NEED MD APPOINTMENT)   meclizine (ANTIVERT) 25 MG tablet Take 1 tablet (25 mg  total) by mouth 3 (three) times daily as needed for dizziness.   meloxicam (MOBIC) 7.5 MG tablet Take 1 tablet (7.5 mg total) by mouth daily.   mirabegron ER (MYRBETRIQ) 50 MG TB24 tablet Take 1 tablet (50 mg total) by mouth daily.   Multiple Vitamins-Minerals (ONE-A-DAY VITACRAVES ADULT) CHEW Chew 1 tablet by mouth daily. Patient take gummy   scopolamine (TRANSDERM-SCOP) 1 MG/3DAYS Place 1 patch (1.5 mg total) onto the skin every 3 (three) days.   sertraline (ZOLOFT) 50 MG tablet TAKE 1 TABLET EVERY DAY (NEED MD APPOINTMENT)   traZODone (DESYREL) 50 MG tablet TAKE 1 TO 2 TABLETS AT NIGHT AS NEEDED FOR SLEEP       04/06/2023    8:29 AM 07/22/2022    11:40 AM 05/25/2022    1:19 PM 05/07/2022   10:05 AM  GAD 7 : Generalized Anxiety Score  Nervous, Anxious, on Edge 0 0 0 0  Control/stop worrying 0 0 0 0  Worry too much - different things 0 0 0 0  Trouble relaxing 0 0 0 0  Restless 0 0 0 0  Easily annoyed or irritable 0 0 0 0  Afraid - awful might happen 0 0 0 0  Total GAD 7 Score 0 0 0 0  Anxiety Difficulty Not difficult at all Not difficult at all Not difficult at all Not difficult at all       08/25/2023    1:31 PM 04/06/2023    8:28 AM 08/21/2022    1:19 PM  Depression screen PHQ 2/9  Decreased Interest 0 0 0  Down, Depressed, Hopeless 0 0 0  PHQ - 2 Score 0 0 0  Altered sleeping 0 0 0  Tired, decreased energy 0 0 0  Change in appetite 0 0 0  Feeling bad or failure about yourself  0 0 0  Trouble concentrating 0 0 0  Moving slowly or fidgety/restless 0 0 0  Suicidal thoughts 0 0 0  PHQ-9 Score 0 0 0  Difficult doing work/chores Not difficult at all Not difficult at all Not difficult at all    BP Readings from Last 3 Encounters:  10/07/23 134/76  05/18/23 118/74  04/06/23 110/74    Physical Exam Vitals and nursing note reviewed.  Constitutional:      General: She is not irritable.She is not in acute distress.    Appearance: She is not diaphoretic.  HENT:     Head: Normocephalic and atraumatic.     Right Ear: Tympanic membrane and external ear normal. There is no impacted cerumen.     Left Ear: Tympanic membrane and external ear normal. There is no impacted cerumen.     Nose: Nose normal. No congestion or rhinorrhea.     Mouth/Throat:     Mouth: Mucous membranes are moist.     Pharynx: No oropharyngeal exudate or posterior oropharyngeal erythema.  Eyes:     General:        Right eye: No discharge.        Left eye: No discharge.     Conjunctiva/sclera: Conjunctivae normal.     Pupils: Pupils are equal, round, and reactive to light.  Neck:     Thyroid: No thyromegaly.     Vascular: No JVD.  Cardiovascular:      Rate and Rhythm: Normal rate and regular rhythm.     Heart sounds: Normal heart sounds. No murmur heard.    No friction rub. No gallop.  Pulmonary:  Effort: Pulmonary effort is normal. No respiratory distress.     Breath sounds: Normal breath sounds. No stridor. No wheezing, rhonchi or rales.  Chest:     Chest wall: No tenderness.  Abdominal:     General: Bowel sounds are normal.     Palpations: Abdomen is soft. There is no mass.     Tenderness: There is no abdominal tenderness. There is no guarding.  Musculoskeletal:        General: Normal range of motion.     Cervical back: Normal range of motion and neck supple.  Lymphadenopathy:     Cervical: No cervical adenopathy.  Skin:    General: Skin is warm and dry.  Neurological:     Mental Status: She is alert.     Deep Tendon Reflexes: Reflexes are normal and symmetric.     Wt Readings from Last 3 Encounters:  10/07/23 142 lb 3.2 oz (64.5 kg)  05/18/23 143 lb (64.9 kg)  04/06/23 144 lb (65.3 kg)    BP 134/76   Pulse 89   Ht 5' (1.524 m)   Wt 142 lb 3.2 oz (64.5 kg)   SpO2 96%   BMI 27.77 kg/m   Assessment and Plan:  1. Familial hypercholesterolemia Chronic.  Controlled.  Stable.  Asymptomatic.  Tolerating medications well.  No myalgias or muscle weakness.  Patient is tolerating atorvastatin 40 mg once a day.  Will check CMP for hepatic concerns and lipid panel for current level of LDL control.  Will recheck patient in 6 months.  Have reemphasized low-cholesterol low triglyceride guidelines. - atorvastatin (LIPITOR) 40 MG tablet; Take 1 tablet (40 mg total) by mouth daily.  Dispense: 90 tablet; Refill: 1 - Comprehensive metabolic panel - Lipid Panel With LDL/HDL Ratio  2. Essential hypertension (Primary) Chronic.  Controlled.  Asymptomatic.  Tolerating medications well.  Blood pressure today is 134/76.  Continue carvedilol 6.25 mg twice a day and losartan 25 mg once a day.  Will check CMP for electrolytes and GFR.   Will recheck patient in 6 months. - carvedilol (COREG) 6.25 MG tablet; Take 1 tablet (6.25 mg total) by mouth 2 (two) times daily with a meal.  Dispense: 180 tablet; Refill: 1 - losartan (COZAAR) 25 MG tablet; Take 1 tablet (25 mg total) by mouth daily.  Dispense: 90 tablet; Refill: 1 - Comprehensive metabolic panel  3. Gastroesophageal reflux disease without esophagitis Chronic.  Controlled.  Stable.  Asymptomatic.  Currently tolerating Prevacid 30 mg once a day.  Will continue at current dosing.  Will recheck patient in 6 months. - lansoprazole (PREVACID) 30 MG capsule; TAKE 1 CAPSULE EVERY DAY (NEED MD APPOINTMENT)  Dispense: 90 capsule; Refill: 1  4. Hypothyroidism, unspecified type Chronic.  Controlled.  Stable.  Asymptomatic at current dosing of levothyroxine supplementation.  We will check TSH and pending results will likely continue at current dosing of levothyroxine 88 mg once a day. - levothyroxine (SYNTHROID) 88 MCG tablet; Take 1 tablet (88 mcg total) by mouth daily before breakfast. Once a day  Dispense: 90 tablet; Refill: 1 - TSH  5. Fatigue, unspecified type See above - losartan (COZAAR) 25 MG tablet; Take 1 tablet (25 mg total) by mouth daily.  Dispense: 90 tablet; Refill: 1  6. Major depressive disorder in partial remission, unspecified whether recurrent (HCC) Chronic.  Controlled.  Stable.  PHQ was 0 GAD score 0.  Continue sertraline 50 mg once a day.  Will recheck patient in 6 months. - sertraline (ZOLOFT) 50 MG  tablet; Take 1 tablet (50 mg total) by mouth daily.  Dispense: 90 tablet; Refill: 1    [name], MD

## 2023-10-08 ENCOUNTER — Encounter: Payer: Self-pay | Admitting: Family Medicine

## 2023-10-08 LAB — COMPREHENSIVE METABOLIC PANEL
ALT: 15 [IU]/L (ref 0–32)
AST: 15 [IU]/L (ref 0–40)
Albumin: 4.6 g/dL (ref 3.8–4.8)
Alkaline Phosphatase: 101 [IU]/L (ref 44–121)
BUN/Creatinine Ratio: 15 (ref 12–28)
BUN: 15 mg/dL (ref 8–27)
Bilirubin Total: 0.9 mg/dL (ref 0.0–1.2)
CO2: 24 mmol/L (ref 20–29)
Calcium: 10.1 mg/dL (ref 8.7–10.3)
Chloride: 100 mmol/L (ref 96–106)
Creatinine, Ser: 1.03 mg/dL — ABNORMAL HIGH (ref 0.57–1.00)
Globulin, Total: 2.5 g/dL (ref 1.5–4.5)
Glucose: 121 mg/dL — ABNORMAL HIGH (ref 70–99)
Potassium: 4.5 mmol/L (ref 3.5–5.2)
Sodium: 140 mmol/L (ref 134–144)
Total Protein: 7.1 g/dL (ref 6.0–8.5)
eGFR: 55 mL/min/{1.73_m2} — ABNORMAL LOW (ref >59)

## 2023-10-08 LAB — TSH: TSH: 2.7 u[IU]/mL (ref 0.450–4.500)

## 2023-10-08 LAB — LIPID PANEL WITH LDL/HDL RATIO
Cholesterol, Total: 121 mg/dL (ref 100–199)
HDL: 46 mg/dL (ref >39)
LDL Chol Calc (NIH): 51 mg/dL (ref 0–99)
LDL/HDL Ratio: 1.1 {ratio} (ref 0.0–3.2)
Triglycerides: 137 mg/dL (ref 0–149)
VLDL Cholesterol Cal: 24 mg/dL (ref 5–40)

## 2023-10-11 ENCOUNTER — Ambulatory Visit
Admission: RE | Admit: 2023-10-11 | Discharge: 2023-10-11 | Disposition: A | Discharge status: Home / Self Care | Payer: Medicare PPO | Source: Ambulatory Visit | Attending: Family Medicine | Admitting: Family Medicine

## 2023-10-11 DIAGNOSIS — Z1231 Encounter for screening mammogram for malignant neoplasm of breast: Secondary | ICD-10-CM | POA: Diagnosis not present

## 2023-10-15 NOTE — Telephone Encounter (Signed)
 Called pt let her know that she is supposed to tale 88 mcg. Pt verbalized understanding.  KP Copied from CRM [id number]. Topic: Clinical - Medication Question >> Oct 15, 2023  8:53 AM Alcus Dad H wrote: Reason for CRM: Patient says she received medication in the mail today, levothyroxine (SYNTHROID) 88 MCG tablet, says she usually takes 75 mcg so she is wondering if the 88 mcg that was prescribed is a mistake. Would like a call back

## 2024-01-03 ENCOUNTER — Ambulatory Visit: Payer: Self-pay | Admitting: Urology

## 2024-01-09 ENCOUNTER — Other Ambulatory Visit: Payer: Self-pay | Admitting: Family Medicine

## 2024-01-09 DIAGNOSIS — F324 Major depressive disorder, single episode, in partial remission: Secondary | ICD-10-CM

## 2024-01-09 DIAGNOSIS — I10 Essential (primary) hypertension: Secondary | ICD-10-CM

## 2024-01-09 DIAGNOSIS — E039 Hypothyroidism, unspecified: Secondary | ICD-10-CM

## 2024-01-25 ENCOUNTER — Telehealth: Payer: Self-pay | Admitting: Family Medicine

## 2024-01-25 DIAGNOSIS — T753XXA Motion sickness, initial encounter: Secondary | ICD-10-CM

## 2024-01-25 NOTE — Telephone Encounter (Unsigned)
 Copied from CRM [id number]. Topic: Clinical - Medication Refill >> Jan 25, 2024  4:40 PM Kevelyn M wrote: Medication: scopolamine  (TRANSDERM-SCOP) 1 MG/3DAYS  Has the patient contacted their pharmacy? Yes (Agent: If no, request that the patient contact the pharmacy for the refill. If patient does not wish to contact the pharmacy document the reason why and proceed with request.) (Agent: If yes, when and what did the pharmacy advise?)  This is the patient's preferred pharmacy:  [facility] - [place], [place] - [address] [place] [address] Phone: [phone] Fax: [phone]  Is this the correct pharmacy for this prescription? Yes If no, delete pharmacy and type the correct one.   Has the prescription been filled recently? No  Is the patient out of the medication? N/a  Has the patient been seen for an appointment in the last year OR does the patient have an upcoming appointment? Yes  Can we respond through MyChart? No  Agent: Please be advised that Rx refills may take up to 3 business days. We ask that you follow-up with your pharmacy.

## 2024-01-27 NOTE — Telephone Encounter (Signed)
 Requested medication (s) are due for refill today -expired Rx  Requested medication (s) are on the active medication list -yes  Future visit scheduled -no  Last refill: 01/29/23 #10  Notes to clinic: Rochelle Chu patient- no appointment on schedule    Requested Prescriptions  Pending Prescriptions Disp Refills   scopolamine  (TRANSDERM-SCOP) 1 MG/3DAYS 10 patch 0    Sig: Place 1 patch (1.5 mg total) onto the skin every 3 (three) days.     Off-Protocol Failed - 01/27/2024 11:03 AM      Failed - Medication not assigned to a protocol, review manually.      Passed - Valid encounter within last 12 months    Recent Outpatient Visits           3 months ago Essential hypertension   [facility] Kayla Part, MD       Future Appointments             In 3 weeks MacDiarmid, Geralyn Knee, MD [facility] [place]               Requested Prescriptions  Pending Prescriptions Disp Refills   scopolamine  (TRANSDERM-SCOP) 1 MG/3DAYS 10 patch 0    Sig: Place 1 patch (1.5 mg total) onto the skin every 3 (three) days.     Off-Protocol Failed - 01/27/2024 11:03 AM      Failed - Medication not assigned to a protocol, review manually.      Passed - Valid encounter within last 12 months    Recent Outpatient Visits           3 months ago Essential hypertension   [facility] Kayla Part, MD       Future Appointments             In 3 weeks MacDiarmid, Geralyn Knee, MD [facility]

## 2024-02-02 ENCOUNTER — Other Ambulatory Visit: Payer: Self-pay | Admitting: Internal Medicine

## 2024-02-02 DIAGNOSIS — R399 Unspecified symptoms and signs involving the genitourinary system: Secondary | ICD-10-CM

## 2024-02-02 DIAGNOSIS — Z13820 Encounter for screening for osteoporosis: Secondary | ICD-10-CM

## 2024-02-21 ENCOUNTER — Ambulatory Visit: Admitting: Urology

## 2024-03-16 ENCOUNTER — Encounter: Payer: Self-pay | Admitting: Urology

## 2024-03-16 MED ORDER — MIRABEGRON ER 50 MG PO TB24: 50.0000 mg | ORAL_TABLET | Freq: Every day | ORAL | 0 refills | Status: DC

## 2024-05-15 ENCOUNTER — Ambulatory Visit: Admitting: Urology

## 2024-05-15 VITALS — BP 116/72 | HR 86 | Ht 60.0 in | Wt 142.0 lb

## 2024-05-15 DIAGNOSIS — N3281 Overactive bladder: Secondary | ICD-10-CM | POA: Diagnosis not present

## 2024-05-15 LAB — MICROSCOPIC EXAMINATION

## 2024-05-15 LAB — URINALYSIS, COMPLETE
Bilirubin, UA: NEGATIVE
Ketones, UA: NEGATIVE
Leukocytes,UA: NEGATIVE
Nitrite, UA: NEGATIVE
Protein,UA: NEGATIVE
RBC, UA: NEGATIVE
Specific Gravity, UA: 1.01 (ref 1.005–1.030)
Urobilinogen, Ur: 0.2 mg/dL (ref 0.2–1.0)
pH, UA: 6 (ref 5.0–7.5)

## 2024-05-15 MED ORDER — MIRABEGRON ER 50 MG PO TB24
50.0000 mg | ORAL_TABLET | Freq: Every day | ORAL | 3 refills | Status: AC
Start: 2024-05-15 — End: ?

## 2024-05-15 NOTE — Progress Notes (Signed)
 05/15/2024 9:29 AM   Sara Carr 04-19-1946 [id number]  Referring provider: Joshua Cathryne BROCKS, MD No address on file  Chief Complaint  Patient presents with   Follow-up    HPI: I was consulted to assess the patient who complains of pain at the end of urination that last for many seconds.  Is been present for approximately 3 months.  She does not appear to get pain otherwise.  She voids about 6-7 times a day and gets up 3 times a night.  She has never smoked.  She is on oxybutynin  but is not helping   She is continent.  She has not had a hysterectomy.  She has had a stroke and is on oral hypoglycemics   She had a urine culture June 25, 2022 that was normal     Patient has pain at the end of urination. She had blood in the urine. I thought was reasonable to give her ciprofloxacin  250 mg twice a day for the next week and have her come back for pelvic examination and cystoscopy. She will need a CT scan if culture is negative and she still has blood in the urine     Last culture was positive.  Patient still going frequently Patient said that burning went away since she started the antibiotics On pelvic examination minimal prolapse no stress incontinence Cystoscopy: normal   Stop oxybutynin . Reassess 6 weeks on Myrbetriq  50 mg samples and prescription. Call if culture positive. I am not can to start suppressive antibiotics at this stage    Today Urge incontinence much better.  Very pleased.  No infections.  Frequency stable.   PMH: Past Medical History:  Diagnosis Date   Allergy    Anemia    as a little gitrl, but not anymore    Arthritis    Back pain    Depression    Diabetes mellitus without complication (HCC)    GERD (gastroesophageal reflux disease)    Hyperlipidemia    Hypertension    Hypothyroidism    Thyroid  disease     Surgical History: Past Surgical History:  Procedure Laterality Date   BACK SURGERY     CHOLECYSTECTOMY  1979   JOINT REPLACEMENT  Bilateral 2017   LUMBAR LAMINECTOMY/ DECOMPRESSION WITH MET-RX N/A 01/22/2020   Procedure: L3-4 laminectomy;  Surgeon: Sara Standing, MD;  Location: [facility] ORS;  Service: Neurosurgery;  Laterality: N/A;   REPLACEMENT TOTAL KNEE Bilateral    SPINE SURGERY  2004    Home Medications:  Allergies as of 05/15/2024       Reactions   Penicillins Hives, Swelling        Medication List        Accurate as of May 15, 2024  9:29 AM. If you have any questions, ask your nurse or doctor.          STOP taking these medications    meclizine  25 MG tablet Commonly known as: ANTIVERT  Stopped by: Sara Carr   scopolamine  1 MG/3DAYS Commonly known as: TRANSDERM-SCOP Stopped by: Sara Carr       TAKE these medications    atorvastatin  40 MG tablet Commonly known as: LIPITOR Take 1 tablet (40 mg total) by mouth daily.   calcium  carbonate 1250 (500 Ca) MG chewable tablet Commonly known as: OS-CAL Chew by mouth.   carvedilol  6.25 MG tablet Commonly known as: COREG  TAKE 1 TABLET TWICE DAILY WITH MEALS   cyanocobalamin  1000 MCG tablet Commonly known as: VITAMIN B12 Take  1,000 mcg by mouth.   docusate sodium  50 MG capsule Commonly known as: COLACE Take 1 capsule (50 mg total) by mouth daily.   Dulaglutide 1.5 MG/0.5ML Soaj Inject into the skin. Inject 0.5 mLs (1.5 mg total) subcutaneously every 7 (seven) days   FreeStyle Libre 2 Reader [name] USE TO MONITOR BLOOD GLUCOSE.   gabapentin  800 MG tablet Commonly known as: NEURONTIN  Take 800 mg by mouth 2 (two) times daily as needed (pain). [facility]   Invokamet XR 150-500 MG Tb24 Generic drug: Canagliflozin-metFORMIN HCl ER Take 1 tablet by mouth in the morning and at bedtime. 2 at breakfast 1 at night   lansoprazole  30 MG capsule Commonly known as: PREVACID  TAKE 1 CAPSULE EVERY DAY (NEED MD APPOINTMENT)   levothyroxine  88 MCG tablet Commonly known as: SYNTHROID  TAKE 1 TABLET ONE TIME DAILY BEFORE  BREAKFAST   losartan  25 MG tablet Commonly known as: COZAAR  Take 1 tablet (25 mg total) by mouth daily.   MAGNESIUM PO   meloxicam  7.5 MG tablet Commonly known as: MOBIC  Take 1 tablet (7.5 mg total) by mouth daily.   mirabegron  ER 50 MG Tb24 tablet Commonly known as: MYRBETRIQ  Take 1 tablet (50 mg total) by mouth daily.   One-A-Day VitaCraves Adult Chew Chew 1 tablet by mouth daily. Patient take gummy   sertraline  50 MG tablet Commonly known as: ZOLOFT  TAKE 1 TABLET EVERY DAY   traZODone 50 MG tablet Commonly known as: DESYREL TAKE 1 TO 2 TABLETS AT NIGHT AS NEEDED FOR SLEEP   Xeroform Petrolat Patch 4x4 Pads Apply 1 Pad topically.        Allergies:  Allergies  Allergen Reactions   Penicillins Hives and Swelling    Family History: Family History  Problem Relation Age of Onset   Stroke Mother    Heart disease Father    Diabetes Father    Hypertension Father    Drug abuse Daughter    Early death Brother    Breast cancer Neg Hx     Social History:  reports that she has never smoked. She has never been exposed to tobacco smoke. She has never used smokeless tobacco. She reports that she does not drink alcohol and does not use drugs.  ROS:                                        Physical Exam: BP 116/72   Pulse 86   Ht 5' (1.524 m)   Wt 64.4 kg   BMI 27.73 kg/m   Constitutional:  Alert and oriented, No acute distress. HEENT: [place] AT, moist mucus membranes.  Trachea midline, no masses.   Laboratory Data: Lab Results  Component Value Date   WBC 5.9 05/04/2022   HGB 10.5 (A) 05/04/2022   HCT 33 (A) 05/04/2022   MCV 88 11/15/2020   PLT 231 05/04/2022    Lab Results  Component Value Date   CREATININE 1.03 (H) 10/07/2023    No results found for: PSA  No results found for: TESTOSTERONE  Lab Results  Component Value Date   HGBA1C 6.9 01/04/2023    Urinalysis    Component Value Date/Time   APPEARANCEUR Clear  11/09/2022 1505   GLUCOSEU 3+ (A) 11/09/2022 1505   BILIRUBINUR Negative 11/09/2022 1505   PROTEINUR Negative 11/09/2022 1505   UROBILINOGEN 0.2 06/25/2022 1126   NITRITE Negative 11/09/2022 1505   LEUKOCYTESUR Negative 11/09/2022 1505  Pertinent Imaging:   Assessment & Plan: Myrbetriq  90 x 3 sent to pharmacy and I will see in 1 year  1. Overactive bladder (Primary)  - Urinalysis, Complete   No follow-ups on file.  Sara DELENA Elizabeth, MD  [facility] [address], [address] [place], [place] [address] [phone]

## 2024-06-05 ENCOUNTER — Encounter: Payer: Self-pay | Admitting: Urology

## 2024-07-31 ENCOUNTER — Other Ambulatory Visit: Payer: Self-pay | Admitting: Physical Medicine & Rehabilitation

## 2024-07-31 DIAGNOSIS — M542 Cervicalgia: Secondary | ICD-10-CM

## 2024-08-05 ENCOUNTER — Inpatient Hospital Stay
Admission: RE | Admit: 2024-08-05 | Discharge: 2024-08-05 | Attending: Physical Medicine & Rehabilitation | Admitting: Physical Medicine & Rehabilitation

## 2024-08-05 DIAGNOSIS — M542 Cervicalgia: Secondary | ICD-10-CM

## 2025-05-14 ENCOUNTER — Ambulatory Visit: Admitting: Urology
